# Patient Record
Sex: Male | Born: 1935 | Race: White | Hispanic: No
Health system: Midwestern US, Community
[De-identification: ages and names within clinical notes are randomized; demographics above are authoritative.]

## PROBLEM LIST (undated history)

## (undated) DIAGNOSIS — K089 Disorder of teeth and supporting structures, unspecified: Secondary | ICD-10-CM

## (undated) DIAGNOSIS — Z978 Presence of other specified devices: Secondary | ICD-10-CM

## (undated) DIAGNOSIS — I4891 Unspecified atrial fibrillation: Secondary | ICD-10-CM

## (undated) DIAGNOSIS — E785 Hyperlipidemia, unspecified: Secondary | ICD-10-CM

## (undated) DIAGNOSIS — Z923 Personal history of irradiation: Secondary | ICD-10-CM

## (undated) DIAGNOSIS — E0590 Thyrotoxicosis, unspecified without thyrotoxic crisis or storm: Secondary | ICD-10-CM

## (undated) DIAGNOSIS — H6692 Otitis media, unspecified, left ear: Secondary | ICD-10-CM

## (undated) DIAGNOSIS — I10 Essential (primary) hypertension: Secondary | ICD-10-CM

## (undated) DIAGNOSIS — I499 Cardiac arrhythmia, unspecified: Secondary | ICD-10-CM

## (undated) DIAGNOSIS — C679 Malignant neoplasm of bladder, unspecified: Secondary | ICD-10-CM

## (undated) DIAGNOSIS — N400 Enlarged prostate without lower urinary tract symptoms: Secondary | ICD-10-CM

## (undated) DIAGNOSIS — D6869 Other thrombophilia: Secondary | ICD-10-CM

## (undated) DIAGNOSIS — K219 Gastro-esophageal reflux disease without esophagitis: Secondary | ICD-10-CM

## (undated) DIAGNOSIS — H7292 Unspecified perforation of tympanic membrane, left ear: Secondary | ICD-10-CM

## (undated) DIAGNOSIS — R519 Headache, unspecified: Secondary | ICD-10-CM

## (undated) HISTORY — PX: MOUTH SURGERY: SHX715

## (undated) HISTORY — DX: Cardiac arrhythmia, unspecified: I49.9

## (undated) HISTORY — DX: Benign prostatic hyperplasia without lower urinary tract symptoms: N40.0

## (undated) HISTORY — DX: Malignant neoplasm of bladder, unspecified: C67.9

---

## 2006-07-04 DIAGNOSIS — D490 Neoplasm of unspecified behavior of digestive system: Secondary | ICD-10-CM

## 2006-07-04 HISTORY — DX: Neoplasm of unspecified behavior of digestive system: D49.0

## 2019-12-24 ENCOUNTER — Ambulatory Visit: Admit: 2019-12-24 | Discharge: 2019-12-24 | Payer: MEDICARE | Attending: Otolaryngology | Primary: Family Medicine

## 2019-12-24 ENCOUNTER — Ambulatory Visit: Attending: Otolaryngology | Primary: Family Medicine

## 2019-12-24 DIAGNOSIS — C050 Malignant neoplasm of hard palate: Secondary | ICD-10-CM

## 2019-12-24 NOTE — Progress Notes (Signed)
HPI:  Austin Wood is a 86 y.o. male seen as a new patient for New Patient (Patient presents today with c/o excessive mucus and sensation of burning of his lips. Patient has history of cancer of hard palate 2007, he had surgery and radiation. Patient also has concerns about L ear , he'd like to be checked for infection . He has pain that comes and goes. ).     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  Patient has been doing very well in regards to his history of cancer with no suspicion of recurrence over the years and has been using obturator for the last 14 years with good benefit.  He also has a history of chronic hearing loss left greater than right and uses a right-sided hearing aid with good benefit.  He has a history of multiple ear infections particular on the left side with a history of left-sided TM perforation.  There is also been some associated increased morning time postnasal drip and throat drainage that has been annoying him and is suspicious to be related to his history of radiation therapy.    Past Medical History, Past Surgical History, Family history, Social History, and Medications were all reviewed with the patient today and updated as necessary.     Allergies   Allergen Reactions   ??? Aspirin Swelling   ??? Sulfa (Sulfonamide Antibiotics) Rash       There is no problem list on file for this patient.      Current Outpatient Medications   Medication Sig   ??? atorvastatin (LIPITOR) 10 mg tablet TAKE 1 TABLET BY MOUTH EVERY DAY   ??? mirtazapine (REMERON) 15 mg tablet    ??? magnesium oxide (MAG-OX) 400 mg tablet TAKE 1 TABLET BY MOUTH EVERY DAY   ??? metoprolol succinate (TOPROL-XL) 50 mg XL tablet TAKE 1 TABLET BY MOUTH EVERY DAY   ??? potassium chloride SR (KLOR-CON 10) 10 mEq tablet    ??? tamsulosin (FLOMAX) 0.4 mg capsule TAKE 1 CAPSULE BY MOUTH EVERY DAY   ??? levothyroxine (SYNTHROID)  50 mcg tablet TAKE 1 TABLET (50 MCG) BY MOUTH DAILY   ??? liothyronine (CYTOMEL) 5 mcg tablet    ??? pantoprazole (PROTONIX) 40 mg tablet    ??? warfarin (COUMADIN) 3 mg tablet    ??? warfarin (COUMADIN) 2 mg tablet    ??? furosemide (LASIX) 20 mg tablet TAKE 1 TABLET BY MOUTH EVERY DAY     No current facility-administered medications for this visit.       Past Medical History:   Diagnosis Date   ??? Carcinoma in situ of hard palate        History reviewed. No pertinent surgical history.    Social History     Tobacco Use   ??? Smoking status: Never Smoker   ??? Smokeless tobacco: Never Used   Substance Use Topics   ??? Alcohol use: Not Currently       History reviewed. No pertinent family history.     ROS:    Review of Systems   Constitutional: Negative for chills and fever.   HENT: Positive for ear pain. Negative for hearing loss.    Eyes: Negative for blurred vision.   Respiratory: Negative for cough.    Cardiovascular: Negative for chest pain.   Gastrointestinal: Negative for heartburn.   Genitourinary: Negative for dysuria.   Musculoskeletal: Negative for  myalgias.   Skin: Negative for rash.   Neurological: Negative for dizziness.   Psychiatric/Behavioral: Negative for depression.          PHYSICAL EXAM:    Visit Vitals  Resp 16   Ht 5\' 11"  (1.803 m)   Wt 160 lb (72.6 kg)   BMI 22.32 kg/m??       Physical Exam  Vitals and nursing note reviewed.   Constitutional:       General: He is awake. He is not in acute distress.     Appearance: Normal appearance. He is well-developed and normal weight. He is not ill-appearing or diaphoretic.   HENT:      Head: Normocephalic and atraumatic.      Jaw: No trismus, tenderness, swelling or pain on movement.      Salivary Glands: Right salivary gland is not diffusely enlarged or tender. Left salivary gland is not diffusely enlarged or tender.      Right Ear: Tympanic membrane, ear canal and external ear normal. No drainage, swelling or tenderness. No middle ear effusion. There is no impacted  cerumen. Tympanic membrane is not perforated.      Left Ear: Tympanic membrane, ear canal and external ear normal. No drainage, swelling or tenderness.  No middle ear effusion. There is no impacted cerumen. Tympanic membrane is not perforated.      Ears:      Comments: Bilateral cerumen impactions debrided under microscopy.    Binocular microscopy exam revealed:    Bilateral EACs patent with no edema or erythema.  Right-sided TMs intact with no perforations, retractions or middle ear effusions.  Left-sided TM with moderate sized anterior perforation without evidence of drainage, middle ear effusion, inflammation.  Light coating of CSF powder applied     Nose: Nose normal. No nasal deformity, nasal tenderness, mucosal edema, congestion or rhinorrhea.      Right Nostril: No epistaxis, septal hematoma or occlusion.      Left Nostril: No epistaxis, septal hematoma or occlusion.      Mouth/Throat:      Lips: No lesions.      Mouth: Mucous membranes are moist. No injury or oral lesions.      Tongue: No lesions. Tongue does not deviate from midline.      Palate: No mass and lesions.      Pharynx: Oropharynx is clear. Uvula midline. No pharyngeal swelling, oropharyngeal exudate, posterior oropharyngeal erythema or uvula swelling.      Tonsils: No tonsillar exudate or tonsillar abscesses.      Comments: Obturator removed.  Left sided palatal defect noted with good mature old borders without evidence of granulation tissue and minimal midline location of leukoplakia.  This appears to be at the area of the location of where the operator comes in the contact at the midline.  No concern for mucosal or submucosal recurrence of lesions masses or tumors.  Remaining oral cavity and oral pharyngeal exam within normal limits  Eyes:      General: No scleral icterus.     Extraocular Movements: Extraocular movements intact.      Conjunctiva/sclera: Conjunctivae normal.      Pupils: Pupils are equal, round, and reactive to light.   Neck:       Thyroid: No thyroid mass or thyromegaly.      Trachea: Trachea and phonation normal. No tracheal tenderness.   Pulmonary:      Effort: Pulmonary effort is normal. No tachypnea.      Breath sounds: No  stridor.   Musculoskeletal:      Cervical back: Normal range of motion and neck supple. No edema, erythema or rigidity.   Lymphadenopathy:      Head:      Right side of head: No submental, submandibular, preauricular or posterior auricular adenopathy.      Left side of head: No submental, submandibular, preauricular or posterior auricular adenopathy.      Cervical: No cervical adenopathy.      Right cervical: No superficial, deep or posterior cervical adenopathy.     Left cervical: No superficial, deep or posterior cervical adenopathy.   Skin:     General: Skin is warm and dry.   Neurological:      Mental Status: He is alert and oriented to person, place, and time.      Cranial Nerves: Cranial nerves are intact. No facial asymmetry.   Psychiatric:         Mood and Affect: Mood and affect normal.         Behavior: Behavior normal.            ASSESSMENT and PLAN        ICD-10-CM ICD-9-CM    1. Squamous cell carcinoma of hard palate (HCC)  C05.0 145.2    2. History of head and neck cancer  Z85.89 V10.89    3. Bilateral impacted cerumen  H61.23 380.4    4. Postnasal drip  R09.82 784.91      Patient was reassured of no concerning findings on the examination of the ears following cerumen debridement.  He does have chronic appearing left anterior moderate sized TM perforation but with minimal hearing on that side.  There is no evidence of chronic or acute inflammatory changes.  Light coating of CSF powder was applied for scant moisture.    Obturator removed from palate which revealed very chronic well-healed defect to the left side palate.  There is no evidence of recurrence of malignant type lesions.    Regarding chronic postnasal drip this is likely related to his palatal defect and history of radiation.  Recommend  humidifier at night and increased oral hydration.  Can also attempt nasal saline sprays as needed.    Recommend follow-up in 12 months for recheck.    Lonzo Cloud, DO  12/24/2019

## 2020-01-16 ENCOUNTER — Ambulatory Visit: Admit: 2020-01-16 | Discharge: 2020-01-16 | Attending: Otolaryngology | Primary: Family Medicine

## 2020-01-16 ENCOUNTER — Ambulatory Visit: Attending: Otolaryngology | Primary: Family Medicine

## 2020-01-16 DIAGNOSIS — K047 Periapical abscess without sinus: Secondary | ICD-10-CM

## 2020-01-16 MED ORDER — CLINDAMYCIN 300 MG CAP
300 mg | ORAL_CAPSULE | Freq: Three times a day (TID) | ORAL | 0 refills | Status: AC
Start: 2020-01-16 — End: 2020-01-23

## 2020-01-16 NOTE — Progress Notes (Signed)
HPI:  Austin Wood is a 86 y.o. male seen for follow up on Ear Pain (Patient presents today with c/o L ear pain x 1 week . Patient states that his pain now radiates to L eye, jaw , and L nostril . Patient complains that his L nostril is congested and usually it will stay that way until the afternoon . ).     Patient presents for follow-up evaluation with new complaint of left-sided otalgia.  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  Patient has been doing very well in regards to his history of cancer with no suspicion of recurrence over the years and has been using obturator for the last 14 years with good benefit.  He also has a history of chronic hearing loss left greater than right and uses a right-sided hearing aid with good benefit.  He has a history of multiple ear infections particular on the left side with a history of left-sided TM perforation.  He states that over the last week he has had significant left-sided ear pain with radiation to his left jaw.  There has been no otorrhea.  There is been no change into his hearing.  He also has multiple fractured teeth which have been watched for the last few years.  He does have dental pain to the left jaw near one of his fracture locations.  He also complains of increased left-sided nasal drainage for the last 1 to 2 weeks.  He does have drainage long-term after his surgery and radiation.    Past Medical History, Past Surgical History, Family history, Social History, and Medications were all reviewed with the patient today and updated as necessary.     Allergies   Allergen Reactions   ??? Aspirin Swelling   ??? Sulfa (Sulfonamide Antibiotics) Rash       There is no problem list on file for this patient.      Current Outpatient Medications   Medication Sig   ??? allopurinoL (ZYLOPRIM) 100 mg tablet TAKE 2 TABLETS BY MOUTH EVERY DAY   ??? clindamycin (CLEOCIN) 300 mg capsule Take 1  Capsule by mouth three (3) times daily for 7 days.   ??? atorvastatin (LIPITOR) 10 mg tablet TAKE 1 TABLET BY MOUTH EVERY DAY   ??? mirtazapine (REMERON) 15 mg tablet    ??? magnesium oxide (MAG-OX) 400 mg tablet TAKE 1 TABLET BY MOUTH EVERY DAY   ??? metoprolol succinate (TOPROL-XL) 50 mg XL tablet TAKE 1 TABLET BY MOUTH EVERY DAY   ??? potassium chloride SR (KLOR-CON 10) 10 mEq tablet    ??? tamsulosin (FLOMAX) 0.4 mg capsule TAKE 1 CAPSULE BY MOUTH EVERY DAY   ??? levothyroxine (SYNTHROID) 50 mcg tablet TAKE 1 TABLET (50 MCG) BY MOUTH DAILY   ??? liothyronine (CYTOMEL) 5 mcg tablet    ??? pantoprazole (PROTONIX) 40 mg tablet    ??? warfarin (COUMADIN) 3 mg tablet    ??? warfarin (COUMADIN) 2 mg tablet    ??? furosemide (LASIX) 20 mg tablet TAKE 1 TABLET BY MOUTH EVERY DAY     No current facility-administered medications for this visit.       Past Medical History:   Diagnosis Date   ??? Carcinoma in situ of hard palate        History reviewed. No pertinent surgical history.    Social History     Tobacco Use   ??? Smoking status:  Never Smoker   ??? Smokeless tobacco: Never Used   Substance Use Topics   ??? Alcohol use: Not Currently       History reviewed. No pertinent family history.     ROS:    Review of Systems   Constitutional: Negative for chills and fever.   HENT: Positive for ear pain. Negative for hearing loss.    Eyes: Negative for blurred vision.   Respiratory: Negative for cough.    Cardiovascular: Negative for chest pain.   Gastrointestinal: Negative for heartburn.   Genitourinary: Negative for dysuria.   Musculoskeletal: Negative for myalgias.   Skin: Negative for rash.   Neurological: Negative for dizziness.   Psychiatric/Behavioral: Negative for depression.          PHYSICAL EXAM:    Visit Vitals  Resp 17   Ht 5\' 11"  (1.803 m)   Wt 159 lb (72.1 kg)   BMI 22.18 kg/m??       Physical Exam  Vitals and nursing note reviewed.   Constitutional:       General: He is awake. He is not in acute distress.     Appearance: Normal appearance. He  is well-developed. He is not ill-appearing or diaphoretic.   HENT:      Head: Normocephalic and atraumatic.      Jaw: No trismus, tenderness, swelling or pain on movement.      Salivary Glands: Right salivary gland is not diffusely enlarged or tender. Left salivary gland is not diffusely enlarged or tender.      Right Ear: Tympanic membrane, ear canal and external ear normal. No drainage, swelling or tenderness. No middle ear effusion. There is no impacted cerumen. Tympanic membrane is not perforated.      Left Ear: Tympanic membrane, ear canal and external ear normal. No drainage, swelling or tenderness.  No middle ear effusion. There is no impacted cerumen. Tympanic membrane is not perforated.      Ears:      Comments: Binocular microscopy exam was performed.    He has stable examination of his ears including a chronic left-sided TM perforation.  There is no otorrhea and no fluid to the middle ear space.  No signs of inflammation or infection.    Left EAC clear with normal-appearing TM without retractions perforations or middle ear effusion.     Nose: Nose normal. No nasal deformity, nasal tenderness, mucosal edema, congestion or rhinorrhea.      Right Nostril: No epistaxis, septal hematoma or occlusion.      Left Nostril: No epistaxis, septal hematoma or occlusion.      Mouth/Throat:      Lips: No lesions.      Mouth: Mucous membranes are moist. No injury or oral lesions.      Tongue: No lesions. Tongue does not deviate from midline.      Palate: No mass and lesions.      Pharynx: Oropharynx is clear. Uvula midline. No pharyngeal swelling, oropharyngeal exudate, posterior oropharyngeal erythema or uvula swelling.      Tonsils: No tonsillar exudate or tonsillar abscesses.      Comments: Obturator removed from heart which reveals large left-sided defect.  There is some thickened nondiscolored drainage from the palate defect.  This was easily cleared with suction.  No signs of inflammatory changes such as edema or  erythema.  Eyes:      General: No scleral icterus.     Extraocular Movements: Extraocular movements intact.      Conjunctiva/sclera: Conjunctivae  normal.      Pupils: Pupils are equal, round, and reactive to light.   Neck:      Thyroid: No thyroid mass or thyromegaly.      Trachea: Trachea and phonation normal. No tracheal tenderness.   Pulmonary:      Effort: Pulmonary effort is normal. No tachypnea.      Breath sounds: No stridor.   Musculoskeletal:      Cervical back: Normal range of motion and neck supple. No edema, erythema or rigidity.   Lymphadenopathy:      Head:      Right side of head: No submental, submandibular, preauricular or posterior auricular adenopathy.      Left side of head: No submental, submandibular, preauricular or posterior auricular adenopathy.      Cervical: No cervical adenopathy.      Right cervical: No superficial, deep or posterior cervical adenopathy.     Left cervical: No superficial, deep or posterior cervical adenopathy.   Skin:     General: Skin is warm and dry.   Neurological:      Mental Status: He is alert and oriented to person, place, and time.      Cranial Nerves: Cranial nerves are intact. No facial asymmetry.   Psychiatric:         Mood and Affect: Mood and affect normal.         Behavior: Behavior normal.            ASSESSMENT and PLAN        ICD-10-CM ICD-9-CM    1. Dental infection  K04.7 522.4    2. Poor dentition  K08.9 525.9 REFERRAL TO ORAL MAXILLOFACIAL SURGERY   3. Otalgia of left ear  H92.02 388.70 EAR MICROSCOPY EXAMINATION   4. 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    He  does have some tenderness over a left-sided mandibular fracture tooth which I have recommended clindamycin and referral to oral surgery for further management recommendations.  He may benefit from having dental extractions.    has otalgia which appears secondary to TMJ dysfunction. Both of his ears looked completely healthy on my exam today w/ no other pathology. For now, I recommend conservative measures w/ soft diet, no gum chewing, warm compresses and OTC anti-inflammatories. If conservatives measures fail then will offer PT referral for TMJ therapy.    For his chronic nasal drainage is likely related to his prior history of radiation therapy and his palate cancer surgery.  He can utilize saline misting sprays as needed for symptomatic relief.      Lonzo Cloud, DO  01/16/2020

## 2020-01-29 NOTE — Telephone Encounter (Signed)
Patient's son called to follow up on his Oral Surgery Referral. Office information from referral given to him.

## 2020-04-23 ENCOUNTER — Ambulatory Visit: Admit: 2020-04-23 | Discharge: 2020-04-23 | Payer: MEDICARE | Attending: Medical | Primary: Family Medicine

## 2020-04-23 ENCOUNTER — Ambulatory Visit: Attending: Medical | Primary: Family Medicine

## 2020-04-23 DIAGNOSIS — H9212 Otorrhea, left ear: Secondary | ICD-10-CM

## 2020-04-23 MED ORDER — OFLOXACIN 0.3 % EAR DROPS
0.3 % | Freq: Two times a day (BID) | OTIC | 2 refills | Status: AC
Start: 2020-04-23 — End: 2020-04-30

## 2020-04-23 NOTE — Progress Notes (Signed)
HPI:  Austin Wood is a 86 y.o. male seen for follow up on Ear Pain (Patient is having pain in left ear and drainage that started about ten days ago.).   Patient presents to the clinic for evaluation of left ear drainage. He reports that the ear started draining 0 days ago. He reports that he has a chronic problem with drainage from the ear. Patient has a history of squamous cell carcinoma of the hard palate in 2007. He has an obturator for the hard palate. Patient denies otalgia.     Past Medical History, Past Surgical History, Family history, Social History, and Medications were all reviewed with the patient today and updated as necessary.     Allergies   Allergen Reactions   ??? Aspirin Swelling   ??? Clindamycin Hives   ??? Sulfa (Sulfonamide Antibiotics) Rash     There is no problem list on file for this patient.    Current Outpatient Medications   Medication Sig   ??? ofloxacin (FLOXIN) 0.3 % otic solution Administer 5 Drops in left ear two (2) times a day for 7 days.   ??? allopurinoL (ZYLOPRIM) 100 mg tablet TAKE 2 TABLETS BY MOUTH EVERY DAY   ??? atorvastatin (LIPITOR) 10 mg tablet TAKE 1 TABLET BY MOUTH EVERY DAY   ??? mirtazapine (REMERON) 15 mg tablet    ??? magnesium oxide (MAG-OX) 400 mg tablet TAKE 1 TABLET BY MOUTH EVERY DAY   ??? metoprolol succinate (TOPROL-XL) 50 mg XL tablet TAKE 1 TABLET BY MOUTH EVERY DAY   ??? potassium chloride SR (KLOR-CON 10) 10 mEq tablet    ??? tamsulosin (FLOMAX) 0.4 mg capsule TAKE 1 CAPSULE BY MOUTH EVERY DAY   ??? levothyroxine (SYNTHROID) 50 mcg tablet TAKE 1 TABLET (50 MCG) BY MOUTH DAILY   ??? liothyronine (CYTOMEL) 5 mcg tablet    ??? pantoprazole (PROTONIX) 40 mg tablet    ??? warfarin (COUMADIN) 3 mg tablet    ??? warfarin (COUMADIN) 2 mg tablet    ??? furosemide (LASIX) 20 mg tablet TAKE 1 TABLET BY MOUTH EVERY DAY     No current facility-administered medications for this visit.     Past Medical History:   Diagnosis Date   ??? Carcinoma in situ of hard palate      Social History     Tobacco Use    ??? Smoking status: Never Smoker   ??? Smokeless tobacco: Never Used   Substance Use Topics   ??? Alcohol use: Not Currently     No past surgical history on file.  No family history on file.     ROS:    Review of Systems   Constitutional: Negative for chills and fever.   HENT: Positive for ear discharge and ear pain.    Eyes: Negative for blurred vision and double vision.   Respiratory: Negative for cough.    Cardiovascular: Negative for chest pain.   Gastrointestinal: Negative for nausea and vomiting.   Musculoskeletal: Negative for neck pain.   Skin: Negative for rash.   Neurological: Negative for dizziness and headaches.   Endo/Heme/Allergies: Negative for environmental allergies.        PHYSICAL EXAM:    Visit Vitals  Resp 16   Ht 5\' 11"  (1.803 m)   Wt 162 lb (73.5 kg)   BMI 22.59 kg/m??       Head  Head and Face - The head and face are atraumatic, normocephalic.  The salivary glands are intact and the facial appearance  is symmetric.    Head shape - No scars, lesions, or masses    Ear  Ear - Right tympanic membrane is clear, the external auditory canal is without discharge and the tympanic membrane is mobile.  There is no tympanic membrane erythema and no middle ear opacity is visualized.  Left chronic TM perforation which is moist.   Pinna: bilateral - No hematomas or lacerations    Eye  Eyeball - bilateral - extraocular motions intact, equal in size and movement    Nose and Sinuses  Nose - mucosa is pink and the septum is midline.  There are no nasal lesions and there was no turbinate hypertrophy.    Mouth and Throat  Lips - upper lip - normal: no dryness, cracking, pallor, cyanosis, or vesicular eruption.  Lower lip: normal: no dryness, cracking, pallor, cyanosis, or vesicular eruption.     Teeth and Gums - No bleeding, no inflammation or ulceration.    Lips - Pink and symmetrical  Oral Cavity - Oral mucosa pink, hard palate obturator in place.  The mucosa is without ulcerations. No oral cavity masses present.    Parotid Gland - Bilateral - Non tender, not swollen.  Oropharynx - No discharge or Erythema  Nasopharynx - Non obstructed, mucosa pink and moist.    Hypopharynx - No erythema  Submandibular Gland - Non tender, not swollen.    Tonsils - Normal    Neck   Neck - Full range of motion and Supple.  Non Tender.   No Masses.    Trachea - Midline.  Thyroid - Gland - Symmetric.  Non Tender.  Nodules - No nodules.    Neurologic - II - XII Grossly intact bilaterally    Cardiac  Inspection - Jugular Vein:  Bilateral - non distended, no prominent pulsations    Chest and Lung  Inspection - Movements:  Chest symmetrical with bilateral expansion, respirations even and non labored      ASSESSMENT and PLAN      ICD-10-CM ICD-9-CM    1. Otorrhea of left ear  H92.12 388.60    2. Perforation of left tympanic membrane  H72.92 384.20    3. History of head and neck cancer  Z85.89 V10.89        Given patient's history, the obturator was removed and he was examined by attending.  According to attending (Dr Argentina Ponder) there were no concerns for cancer of the hard palate. Patient is scheduled to see an oral surgeon soon. I will send in floxin drops for the left ear.      Neill Loft, PA-C  04/23/2020

## 2020-06-12 ENCOUNTER — Ambulatory Visit
Admit: 2020-06-12 | Discharge: 2020-06-12 | Payer: MEDICARE | Attending: Otolaryngology/Facial Plastic Surgery | Primary: Family Medicine

## 2020-06-12 ENCOUNTER — Encounter: Attending: Medical | Primary: Family Medicine

## 2020-06-12 ENCOUNTER — Ambulatory Visit: Attending: Otolaryngology/Facial Plastic Surgery | Primary: Family Medicine

## 2020-06-12 DIAGNOSIS — H7292 Unspecified perforation of tympanic membrane, left ear: Secondary | ICD-10-CM

## 2020-06-12 NOTE — Progress Notes (Signed)
HPI:  Austin Wood is a 86 y.o. male seen in follow-up for Ear Pain (Patient presents today with c/o bilateral ear pain L>R .). Comes in today w/ L > R otalgia- he has known L TM perforation and has seen Dr. Elby Beck and Andee Poles in the past. He feels like there is some drainage in L ear but he has not seen any crusting around the meatus. There is some pain in L ear intermittently as well. He has some pain on R side today as well but no otorrhea. 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 He does report significant nasal crusting on L side almost every morning and increased tearing in L eye as well. He uses warm compresses over the L side of his face to help relieve the pressure.    Past Medical History, Past Surgical History, Family history, Social History, and Medications were all reviewed with the patient today and updated as necessary.     Allergies   Allergen Reactions   ??? Clindamycin Hives     Other reaction(s): Myalgia-Intolerance  stomach pain    ??? Aspirin Swelling   ??? Sulfa (Sulfonamide Antibiotics) Rash     There is no problem list on file for this patient.    Current Outpatient Medications   Medication Sig   ??? allopurinoL (ZYLOPRIM) 100 mg tablet TAKE 2 TABLETS BY MOUTH EVERY DAY   ??? atorvastatin (LIPITOR) 10 mg tablet TAKE 1 TABLET BY MOUTH EVERY DAY   ??? mirtazapine (REMERON) 15 mg tablet    ??? magnesium oxide (MAG-OX) 400 mg tablet TAKE 1 TABLET BY MOUTH EVERY DAY   ??? metoprolol succinate (TOPROL-XL) 50 mg XL tablet TAKE 1 TABLET BY MOUTH EVERY DAY   ??? potassium chloride SR (KLOR-CON 10) 10 mEq tablet    ??? tamsulosin (FLOMAX) 0.4 mg capsule TAKE 1 CAPSULE BY MOUTH EVERY DAY   ??? levothyroxine (SYNTHROID) 50 mcg tablet TAKE 1 TABLET (50 MCG) BY MOUTH DAILY   ??? liothyronine (CYTOMEL) 5 mcg tablet    ??? pantoprazole (PROTONIX) 40 mg tablet    ??? warfarin (COUMADIN) 3 mg tablet    ??? warfarin  (COUMADIN) 2 mg tablet    ??? furosemide (LASIX) 20 mg tablet TAKE 1 TABLET BY MOUTH EVERY DAY     No current facility-administered medications for this visit.     Past Medical History:   Diagnosis Date   ??? Carcinoma in situ of hard palate      Social History     Tobacco Use   ??? Smoking status: Never Smoker   ??? Smokeless tobacco: Never Used   Substance Use Topics   ??? Alcohol use: Not Currently     History reviewed. No pertinent surgical history.  History reviewed. No pertinent family history.     ROS:    Review of Systems   Constitutional: Negative for activity change.   HENT: Positive for ear pain.    Eyes: Negative for discharge.   Respiratory: Negative for apnea.    Cardiovascular: Negative for chest pain.   Gastrointestinal: Negative for abdominal distention.   Endocrine: Negative for cold intolerance.   Genitourinary: Negative for difficulty urinating.   Musculoskeletal: Negative for arthralgias.   Skin: Negative for color change.   Allergic/Immunologic: Negative for environmental allergies.   Neurological: Negative for dizziness.   Hematological: Negative  for adenopathy.   Psychiatric/Behavioral: Negative for agitation.        PHYSICAL EXAM:    Visit Vitals  Resp 19   Ht 5\' 11"  (1.803 m)   Wt 164 lb (74.4 kg)   BMI 22.87 kg/m??       General: NAD, well-appearing  Neuro: No gross neuro deficits. No facial weakness.  Eyes: No periorbital edema/ecchymosis. No nystagmus.  Skin: No facial erythema, rashes or concerning lesions.  Nose: No external deviations or saddling. Intranasally, septum is midline without perforations, nasal mucosa appears healthy with no erythema, mucopurulence, or polyps.  Mouth: Otburator in place. Poor remaining dentition. Changes c/w RT along pharynx w/ telangiectasias- no concerning masses or lesions.   Ears: Normal appearing auricles, no hematomas. R side- mild cerumen (removed), dry canal skin, intact TM, clear ME space. L side- clear laterally but there is crust overlying a 25-30% central  TM perf w/ some surrounding granulation- ME space was clear.   Neck: Soft, supple, no palpable neck masses. No palpable parotid or submandibular masses. No thyromegaly or palpable thyroid nodules.   Lymphatics: No palpable cervical LAD.  Resp: No audible stridor or wheezing.  Extremities: No clubbing or cyanosis.      ASSESSMENT and PLAN      ICD-10-CM ICD-9-CM    1. Tympanic membrane perforation, left  H72.92 384.20    2. Otorrhea of left ear  H92.12 388.60      He had some moisture down near his known L TM perforation and there was even some associated granulation. I debrided that ear and applied some CSF powder. He will continue to follow strict dry ear precautions on that side. My Audiologist cleaned out his R sided hearing aids as well. If the drainage continues, he may need some ear drops. RTC prn.    Marijo File, MD  06/12/2020

## 2020-09-18 MED ORDER — CIPROFLOXACIN-DEXAMETHASONE 0.3 %-0.1 % EAR DROPS, SUSP
Freq: Two times a day (BID) | OTIC | 2 refills | Status: AC
Start: 2020-09-18 — End: ?

## 2020-09-21 MED ORDER — NEOMYCIN-POLYMYXIN-HC 3.5 MG-10,000 UNIT/ML-1 % EAR DROPS, SUSP
3.5-10000-1 mg/mL-unit/mL-% | Freq: Three times a day (TID) | OTIC | 2 refills | Status: AC
Start: 2020-09-21 — End: 2020-09-26

## 2020-11-08 ENCOUNTER — Inpatient Hospital Stay
Admit: 2020-11-08 | Discharge: 2020-11-08 | Disposition: A | Discharge status: Home / Self Care | Payer: MEDICARE | Attending: Student in an Organized Health Care Education/Training Program

## 2020-11-08 DIAGNOSIS — N390 Urinary tract infection, site not specified: Secondary | ICD-10-CM

## 2020-11-08 LAB — URINALYSIS W/ RFLX MICROSCOPIC
Bilirubin, Urine: NEGATIVE
Bilirubin: NEGATIVE
Casts UA: 0 /lpf
Casts: 0 /lpf
Glucose, Ur: NEGATIVE mg/dL
Glucose: NEGATIVE mg/dL
Ketone: NEGATIVE mg/dL
Ketones, Urine: NEGATIVE mg/dL
Nitrite, Urine: POSITIVE — AB
Nitrites: POSITIVE — AB
RBC, UA: >100 /hpf — ABNORMAL HIGH
RBC: >100 /hpf — ABNORMAL HIGH
Specific Gravity, UA: 1.008 (ref 1.001–1.023)
Specific gravity: 1.008 (ref 1.001–1.023)
Urobilinogen, UA, POCT: 0.2 EU/dL (ref 0.2–1.0)
Urobilinogen: 0.2 EU/dL (ref 0.2–1.0)
WBC, UA: >100 /hpf — ABNORMAL HIGH
WBC: >100 /hpf — ABNORMAL HIGH
pH (UA): 7 (ref 5.0–9.0)
pH, UA: 7 (ref 5.0–9.0)

## 2020-11-08 MED ORDER — LIDOCAINE 2 % MUCOUS MEMBRANE JELLY IN APPLICATOR
2 % | Freq: Once | Status: DC
Start: 2020-11-08 — End: 2020-11-08

## 2020-11-08 MED ORDER — CEFPODOXIME 100 MG TAB
100 mg | ORAL_TABLET | Freq: Two times a day (BID) | ORAL | 0 refills | Status: AC
Start: 2020-11-08 — End: 2020-11-18

## 2020-11-08 NOTE — ED Notes (Signed)
I have reviewed discharge instructions with the patient and caregiver.  The patient and caregiver verbalized understanding.    Patient left ED via Discharge Method: ambulatory to Home with son.    Opportunity for questions and clarification provided.       Patient given 1 scripts.         To continue your aftercare when you leave the hospital, you may receive an automated call from our care team to check in on how you are doing.  This is a free service and part of our promise to provide the best care and service to meet your aftercare needs." If you have questions, or wish to unsubscribe from this service please call [phone].  Thank you for Choosing our [facility] Emergency Department.

## 2020-11-08 NOTE — ED Provider Notes (Signed)
ED Provider Notes by Debera Lat, DO at 11/08/20 6063                Author: Debera Lat, DO  Service: Emergency Medicine  Author Type: Physician       Filed: 11/08/20 0248  Date of Service: 11/08/20 0226  Status: Addendum          Editor: Debera Lat, DO (Physician)          Related Notes: Original Note by Debera Lat, DO (Physician) filed at 11/08/20 0160            Procedure Orders        1. Bedside US [[id number]] ordered by Debera Lat, DO                              87 year old male patient presents to this department with reports of urinary retention.  Patient states has been  unable to urinate normally for at least 10 hours.  Son at bedside with whom patient resides states that he passes only small drops of fluid.  He was recently treated for UTI and completed the entire course of the medication.  Patient reports discomfort  at the suprapubic region and the urge to urinate.  No reports of fever or chills, nausea or vomiting.  Patient has experienced similar episode in the past, he is established with a local urologist but is yet to see this provider as he just recently relocated  from [place] to live with his son.                  Past Medical History:        Diagnosis  Date         ?  Carcinoma in situ of hard palate             No past surgical history on file.        No family history on file.        Social History          Socioeconomic History         ?  Marital status:  WIDOWED              Spouse name:  Not on file         ?  Number of children:  Not on file     ?  Years of education:  Not on file     ?  Highest education level:  Not on file       Occupational History        ?  Not on file       Tobacco Use         ?  Smoking status:  Never Smoker     ?  Smokeless tobacco:  Never Used       Vaping Use         ?  Vaping Use:  Never used       Substance and Sexual Activity         ?  Alcohol use:  Not Currently     ?  Drug use:  Not on file     ?   Sexual activity:  Not on file        Other Topics  Concern        ?  Not on file  Social History Narrative        ?  Not on file          Social Determinants of Health          Financial Resource Strain:         ?  Difficulty of Paying Living Expenses: Not on file       Food Insecurity:         ?  Worried About Running Out of Food in the Last Year: Not on file     ?  Ran Out of Food in the Last Year: Not on file       Transportation Needs:         ?  Lack of Transportation (Medical): Not on file     ?  Lack of Transportation (Non-Medical): Not on file       Physical Activity:         ?  Days of Exercise per Week: Not on file     ?  Minutes of Exercise per Session: Not on file       Stress:         ?  Feeling of Stress : Not on file       Social Connections:         ?  Frequency of Communication with Friends and Family: Not on file     ?  Frequency of Social Gatherings with Friends and Family: Not on file     ?  Attends Religious Services: Not on file     ?  Active Member of Clubs or Organizations: Not on file     ?  Attends Banker Meetings: Not on file     ?  Marital Status: Not on file       Intimate Partner Violence:         ?  Fear of Current or Ex-Partner: Not on file     ?  Emotionally Abused: Not on file     ?  Physically Abused: Not on file     ?  Sexually Abused: Not on file       Housing Stability:         ?  Unable to Pay for Housing in the Last Year: Not on file     ?  Number of Places Lived in the Last Year: Not on file        ?  Unstable Housing in the Last Year: Not on file              ALLERGIES: Clindamycin, Aspirin, and Sulfa (sulfonamide antibiotics)      Review of Systems    Constitutional: Negative for chills, diaphoresis and fever.    HENT: Negative for congestion, sneezing and sore throat.     Eyes: Negative for visual disturbance.    Respiratory: Negative for cough, chest tightness, shortness of breath and wheezing.     Cardiovascular: Negative for chest pain and leg  swelling.    Gastrointestinal: Positive for abdominal pain. Negative for blood in stool, diarrhea, nausea and vomiting.    Endocrine: Negative for polyuria.    Genitourinary: Positive for difficulty urinating and urgency . Negative for dysuria, flank pain and hematuria.    Musculoskeletal: Negative for back pain, myalgias, neck pain and neck stiffness.    Skin: Negative for color change and rash.    Neurological: Negative for dizziness, syncope, speech difficulty, weakness, light-headedness, numbness and headaches.    Psychiatric/Behavioral:  Negative for behavioral problems.    All other systems reviewed and are negative.           Vitals:          11/08/20 0137        BP:  (!) 146/71     Pulse:  92     Resp:  18     Temp:  97.4 ??F (36.3 ??C)     SpO2:  99%     Weight:  73.5 kg (162 lb)        Height:  5\' 11"  (1.803 m)                Physical Exam   Vitals and nursing note reviewed.   Constitutional:        General: He is not in acute distress.     Appearance: He is well-developed. He is not diaphoretic.      Comments: Well-appearing no elderly male patient,alert and oriented to person  place and time.  No acute distress, speaks in clear, fluid sentences.    HENT:       Head: Normocephalic and atraumatic.      Right Ear: External ear normal.      Left Ear: External ear normal.      Nose: Nose normal.   Eyes:       Pupils: Pupils are equal, round, and reactive to light.   Cardiovascular:       Rate and Rhythm: Normal rate and regular rhythm.      Heart sounds: Normal heart sounds. No murmur heard.   No friction rub. No gallop.     Pulmonary:       Effort: Pulmonary effort is normal. No respiratory distress.      Breath sounds: Normal breath sounds. No stridor. No decreased breath  sounds, wheezing, rhonchi or rales.   Chest :       Chest wall: No tenderness.   Abdominal :      General: There is no distension.      Palpations: Abdomen is soft. There is no mass.      Tenderness: There is abdominal tenderness  in the  suprapubic area. There is no guarding or rebound.      Hernia: No hernia is present.            Comments: Reproducible tenderness and fullness over the suprapubic region.      Musculoskeletal:          General: No tenderness or deformity. Normal range of motion.      Cervical back: Normal range of motion.    Skin:      General: Skin is warm and dry.   Neurological :       Mental Status: He is alert and oriented to person, place, and time.      Cranial Nerves: No cranial nerve deficit.             MDM   Number of Diagnoses or Management Options   Urinary retention: new  and requires workup   Urinary tract infection with hematuria, site unspecified: new and requires workup   Diagnosis management comments: Significant distention of the urinary bladder on bedside ultrasound imaging.  Place order for Foley catheterization and urinalysis.   Vitally stable otherwise.  No indication for laboratory testing at this time.   Patient's son, he is established with a local urologist with whom he is planning to follow-up on Friday of this week.  Will  encourage this follow-up and provide referral to [facility] urology as well.   Catheter in place, patient feels much better, urinalysis shows heavy glucose urea, hematuria and trace bacteria, will cover with antibiotic.  Culture sent on urinalysis as well.      Voice dictation software was used during the making of this note.  This software is not perfect and grammatical and other typographical errors may be present.  This note has been proofread, but may still contain errors.   Alfonso Patten Garvey Westcott, DO; 11/08/2020 @2 :28 AM    ===================================================================             Amount and/or Complexity of Data Reviewed   Clinical lab tests: ordered and reviewed      Risk of Complications, Morbidity, and/or Mortality   Presenting problems: moderate  Diagnostic procedures: low  Management options: moderate     Patient Progress   Patient progress:  stable             Bedside US      Date/Time: 11/08/2020 2:27 AM   Performed by:  Debera Lat, DO   Authorized by:  Debera Lat, DO       Verbal consent obtained: Yes     Given by:  Patient   Type of procedure:  Focused renal/urinary tract   Indications:  Abdominal pain and urinary retention   Right kidney long axis (coronal):  Not obtained   Right kidney short axis:  Not obtained   Left kidney long axis (coronal):  Not obtained   Left kidney short axis:  Not obtained   Transverse bladder:  Adequate   Sagittal bladder:  Adequate   Bladder size:  Distended

## 2020-11-08 NOTE — ED Notes (Signed)
Pt ambulatory to triage. Pt's son reports pt has been unable to urinate for about 10 hours and it's painful to urinate. Reports lower abdominal pain. Reports drinking water like normal as well. Pt reports going to PCP about 1.5 weeks ago for UTI. Patient finished whole antibiotic course.

## 2020-11-08 NOTE — Progress Notes (Signed)
Patient discharged on cefpodoxime, will await final culture results

## 2020-11-11 LAB — CULTURE, URINE
Culture result:: >100000
Culture: >100000

## 2021-04-21 ENCOUNTER — Ambulatory Visit
Admit: 2021-04-21 | Discharge: 2021-04-21 | Payer: MEDICARE | Attending: Otolaryngology/Facial Plastic Surgery | Primary: Family Medicine

## 2021-04-21 DIAGNOSIS — H9202 Otalgia, left ear: Secondary | ICD-10-CM

## 2021-04-21 NOTE — Progress Notes (Signed)
Chief Complaint   Patient presents with    Follow-up     Right ear pain and drainage       HPI:  Austin Wood is a 87 y.o. male seen in follow-up for left-sided otalgia and otorrhea.  He has a known left TM perforation and I had seen him most recently back in December 2021.  He has known SNHL and wears an aid on the right side.  He reports pain in the left ear for the last week or so and there is more moisture as well.  No drainage or pain on the right side. 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     Past Medical History, Past Surgical History, Family history, Social History, and Medications were all reviewed with the patient today and updated as necessary.     Allergies   Allergen Reactions    Clindamycin Hives     Other reaction(s): Myalgia-Intolerance  stomach pain     Aspirin Swelling    Hyoscyamine Other (See Comments)    Sulfa Antibiotics Rash     There is no problem list on file for this patient.    Current Outpatient Medications   Medication Sig    melatonin 3 MG TABS tablet Take 3 mg by mouth daily    warfarin (COUMADIN) 1 MG tablet Take 1 mg by mouth    mirtazapine (REMERON) 15 MG tablet Take 15 mg by mouth nightly    atorvastatin (LIPITOR) 10 MG tablet TAKE 1 TABLET BY MOUTH EVERY DAY    ciprofloxacin-dexamethasone (CIPRODEX) 0.3-0.1 % otic suspension Place 4 drops in ear(s) 2 times daily    furosemide (LASIX) 20 MG tablet TAKE 1 TABLET BY MOUTH EVERY DAY    levothyroxine (SYNTHROID) 50 MCG tablet TAKE 1 TABLET (50 MCG) BY MOUTH DAILY    magnesium oxide (MAG-OX) 400 (240 Mg) MG tablet TAKE 1 TABLET BY MOUTH EVERY DAY    metoprolol succinate (TOPROL XL) 50 MG extended release tablet TAKE 1 TABLET BY MOUTH EVERY DAY    tamsulosin (FLOMAX) 0.4 MG capsule TAKE 1 CAPSULE BY MOUTH EVERY DAY     No current facility-administered medications for this visit.     Past Medical History:   Diagnosis Date    Cancer 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     Social History     Tobacco Use    Smoking status: Former     Packs/day: 0.25     Years: 5.00     Pack years: 1.25     Types: Cigarettes, Cigars     Start date: 07/04/1948     Quit date: 07/04/1968     Years since quitting: 52.8    Smokeless tobacco: Never    Tobacco comments:     Very light use.   Substance Use Topics    Alcohol use: Not Currently     Past Surgical History:   Procedure Laterality Date    PALATE SURGERY      TONSILLECTOMY  1931     No family history on file.     ROS:    Review of Systems   HENT:  Positive for ear discharge and ear pain.    Eyes: Negative.    Respiratory: Negative.  Cardiovascular: Negative.    Gastrointestinal: Negative.    Endocrine: Negative.    Genitourinary: Negative.    Musculoskeletal: Negative.    Skin: Negative.    Allergic/Immunologic: Negative.    Neurological: Negative.    Hematological: Negative.    Psychiatric/Behavioral: Negative.        PHYSICAL EXAM:    Ht 5\' 11"  (1.803 m)    Wt 148 lb (67.1 kg)    BMI 20.64 kg/m??     General: NAD, well-appearing  Neuro: No gross neuro deficits. No facial weakness.  Eyes: No periorbital edema/ecchymosis. No nystagmus.  Skin: No facial erythema, rashes or concerning lesions.  Nose: No external deviations or saddling. Intranasally, septum is midline without perforations, nasal mucosa appears healthy with no erythema, mucopurulence, or polyps.  Mouth: Otburator in place. Poor remaining dentition. Changes c/w RT along pharynx w/ telangiectasias- no concerning masses or lesions.   Ears: Normal appearing auricles, no hematomas. R side- BTE aid in place, mild cerumen (removed), dry canal skin, intact TM, clear ME space. L side- clear laterally but there is crusting along floor of EAC which was debrided, TM has chronic appearing 25-30% central perforation w/ mild moisture in ME space- CSF powder applied.  Neck: Soft, supple, no palpable  neck masses. No palpable parotid or submandibular masses. No thyromegaly or palpable thyroid nodules.   Lymphatics: No palpable cervical LAD.  Resp: No audible stridor or wheezing.  Extremities: No clubbing or cyanosis.      ASSESSMENT and PLAN      ICD-10-CM    1. Otalgia, left  H92.02       2. Tympanic membrane perforation, left  H72.92         He had some crusting along the floor of the left ear canal and there was some moisture within the left middle ear space.  He continues to have a chronic appearing 30% central left TM perforation.  I debrided the left ear and applied some CSF powder.  They will use Cortisporin eardrops for 3 days to help clear up any infection in the left ear. RTC prn.    Marijo File, MD  04/21/2021    Electronically signed by Marijo File, MD on 04/21/2021 at 4:29 PM

## 2021-07-29 ENCOUNTER — Encounter (HOSPITAL_COMMUNITY): Payer: Self-pay

## 2021-07-29 ENCOUNTER — Other Ambulatory Visit: Payer: Self-pay

## 2021-07-29 ENCOUNTER — Inpatient Hospital Stay (HOSPITAL_COMMUNITY)
Admission: EM | Admit: 2021-07-29 | Discharge: 2021-08-06 | DRG: 698 | Disposition: A | Discharge status: Skilled Nursing Facility | Payer: Medicare Other | Source: Skilled Nursing Facility | Attending: Family Medicine | Admitting: Family Medicine

## 2021-07-29 ENCOUNTER — Emergency Department (HOSPITAL_COMMUNITY): Payer: Medicare Other

## 2021-07-29 ENCOUNTER — Inpatient Hospital Stay (HOSPITAL_COMMUNITY): Payer: Medicare Other

## 2021-07-29 DIAGNOSIS — T83511A Infection and inflammatory reaction due to indwelling urethral catheter, initial encounter: Secondary | ICD-10-CM | POA: Diagnosis present

## 2021-07-29 DIAGNOSIS — C679 Malignant neoplasm of bladder, unspecified: Secondary | ICD-10-CM | POA: Diagnosis not present

## 2021-07-29 DIAGNOSIS — R64 Cachexia: Secondary | ICD-10-CM | POA: Diagnosis present

## 2021-07-29 DIAGNOSIS — I482 Chronic atrial fibrillation, unspecified: Secondary | ICD-10-CM | POA: Diagnosis present

## 2021-07-29 DIAGNOSIS — M25551 Pain in right hip: Secondary | ICD-10-CM | POA: Diagnosis present

## 2021-07-29 DIAGNOSIS — E039 Hypothyroidism, unspecified: Secondary | ICD-10-CM | POA: Diagnosis present

## 2021-07-29 DIAGNOSIS — R791 Abnormal coagulation profile: Secondary | ICD-10-CM | POA: Diagnosis present

## 2021-07-29 DIAGNOSIS — Z79890 Hormone replacement therapy (postmenopausal): Secondary | ICD-10-CM

## 2021-07-29 DIAGNOSIS — Z20822 Contact with and (suspected) exposure to covid-19: Secondary | ICD-10-CM | POA: Diagnosis present

## 2021-07-29 DIAGNOSIS — I959 Hypotension, unspecified: Secondary | ICD-10-CM | POA: Diagnosis present

## 2021-07-29 DIAGNOSIS — R627 Adult failure to thrive: Secondary | ICD-10-CM | POA: Diagnosis present

## 2021-07-29 DIAGNOSIS — B3749 Other urogenital candidiasis: Secondary | ICD-10-CM | POA: Diagnosis present

## 2021-07-29 DIAGNOSIS — R339 Retention of urine, unspecified: Secondary | ICD-10-CM | POA: Diagnosis present

## 2021-07-29 DIAGNOSIS — E785 Hyperlipidemia, unspecified: Secondary | ICD-10-CM | POA: Diagnosis present

## 2021-07-29 DIAGNOSIS — J189 Pneumonia, unspecified organism: Secondary | ICD-10-CM | POA: Diagnosis not present

## 2021-07-29 DIAGNOSIS — Z9079 Acquired absence of other genital organ(s): Secondary | ICD-10-CM

## 2021-07-29 DIAGNOSIS — L89151 Pressure ulcer of sacral region, stage 1: Secondary | ICD-10-CM | POA: Diagnosis present

## 2021-07-29 DIAGNOSIS — H9190 Unspecified hearing loss, unspecified ear: Secondary | ICD-10-CM | POA: Diagnosis present

## 2021-07-29 DIAGNOSIS — J690 Pneumonitis due to inhalation of food and vomit: Secondary | ICD-10-CM | POA: Diagnosis present

## 2021-07-29 DIAGNOSIS — Z6820 Body mass index (BMI) 20.0-20.9, adult: Secondary | ICD-10-CM | POA: Diagnosis not present

## 2021-07-29 DIAGNOSIS — R1314 Dysphagia, pharyngoesophageal phase: Secondary | ICD-10-CM | POA: Diagnosis present

## 2021-07-29 DIAGNOSIS — I10 Essential (primary) hypertension: Secondary | ICD-10-CM | POA: Diagnosis present

## 2021-07-29 DIAGNOSIS — Y732 Prosthetic and other implants, materials and accessory gastroenterology and urology devices associated with adverse incidents: Secondary | ICD-10-CM | POA: Diagnosis present

## 2021-07-29 DIAGNOSIS — L98429 Non-pressure chronic ulcer of back with unspecified severity: Secondary | ICD-10-CM | POA: Diagnosis not present

## 2021-07-29 DIAGNOSIS — Z9221 Personal history of antineoplastic chemotherapy: Secondary | ICD-10-CM

## 2021-07-29 DIAGNOSIS — Z96641 Presence of right artificial hip joint: Secondary | ICD-10-CM | POA: Diagnosis present

## 2021-07-29 DIAGNOSIS — Z66 Do not resuscitate: Secondary | ICD-10-CM | POA: Diagnosis present

## 2021-07-29 DIAGNOSIS — I4891 Unspecified atrial fibrillation: Secondary | ICD-10-CM | POA: Diagnosis not present

## 2021-07-29 DIAGNOSIS — N390 Urinary tract infection, site not specified: Secondary | ICD-10-CM | POA: Diagnosis present

## 2021-07-29 DIAGNOSIS — Z9181 History of falling: Secondary | ICD-10-CM

## 2021-07-29 DIAGNOSIS — Z85818 Personal history of malignant neoplasm of other sites of lip, oral cavity, and pharynx: Secondary | ICD-10-CM | POA: Diagnosis not present

## 2021-07-29 DIAGNOSIS — Z8551 Personal history of malignant neoplasm of bladder: Secondary | ICD-10-CM | POA: Diagnosis not present

## 2021-07-29 DIAGNOSIS — Z7901 Long term (current) use of anticoagulants: Secondary | ICD-10-CM

## 2021-07-29 DIAGNOSIS — Z923 Personal history of irradiation: Secondary | ICD-10-CM

## 2021-07-29 DIAGNOSIS — Z79899 Other long term (current) drug therapy: Secondary | ICD-10-CM

## 2021-07-29 DIAGNOSIS — R300 Dysuria: Secondary | ICD-10-CM | POA: Diagnosis present

## 2021-07-29 DIAGNOSIS — R52 Pain, unspecified: Secondary | ICD-10-CM

## 2021-07-29 DIAGNOSIS — B3742 Candidal balanitis: Secondary | ICD-10-CM | POA: Diagnosis not present

## 2021-07-29 DIAGNOSIS — K5900 Constipation, unspecified: Secondary | ICD-10-CM | POA: Diagnosis present

## 2021-07-29 DIAGNOSIS — Z87891 Personal history of nicotine dependence: Secondary | ICD-10-CM

## 2021-07-29 HISTORY — DX: Thyrotoxicosis, unspecified without thyrotoxic crisis or storm: E05.90

## 2021-07-29 HISTORY — DX: Hyperlipidemia, unspecified: E78.5

## 2021-07-29 HISTORY — DX: Essential (primary) hypertension: I10

## 2021-07-29 HISTORY — DX: Unspecified atrial fibrillation: I48.91

## 2021-07-29 LAB — CBC WITH DIFFERENTIAL/PLATELET
Abs Immature Granulocytes: 0.06 10*3/uL (ref 0.00–0.07)
Basophils Absolute: 0 10*3/uL (ref 0.0–0.1)
Basophils Relative: 0 %
Eosinophils Absolute: 0 10*3/uL (ref 0.0–0.5)
Eosinophils Relative: 0 %
HCT: 40.2 % (ref 39.0–52.0)
Hemoglobin: 13.4 g/dL (ref 13.0–17.0)
Immature Granulocytes: 0 %
Lymphocytes Relative: 11 %
Lymphs Abs: 1.5 10*3/uL (ref 0.7–4.0)
MCH: 36.3 pg — ABNORMAL HIGH (ref 26.0–34.0)
MCHC: 33.3 g/dL (ref 30.0–36.0)
MCV: 108.9 fL — ABNORMAL HIGH (ref 80.0–100.0)
Monocytes Absolute: 1.3 10*3/uL — ABNORMAL HIGH (ref 0.1–1.0)
Monocytes Relative: 9 %
Neutro Abs: 11.8 10*3/uL — ABNORMAL HIGH (ref 1.7–7.7)
Neutrophils Relative %: 80 %
Platelets: 204 10*3/uL (ref 150–400)
RBC: 3.69 MIL/uL — ABNORMAL LOW (ref 4.22–5.81)
RDW: 13.2 % (ref 11.5–15.5)
WBC: 14.7 10*3/uL — ABNORMAL HIGH (ref 4.0–10.5)
nRBC: 0 % (ref 0.0–0.2)

## 2021-07-29 LAB — COMPREHENSIVE METABOLIC PANEL
ALT: 20 U/L (ref 0–44)
AST: 31 U/L (ref 15–41)
Albumin: 3.9 g/dL (ref 3.5–5.0)
Alkaline Phosphatase: 79 U/L (ref 38–126)
Anion gap: 11 (ref 5–15)
BUN: 41 mg/dL — ABNORMAL HIGH (ref 8–23)
CO2: 26 mmol/L (ref 22–32)
Calcium: 11 mg/dL — ABNORMAL HIGH (ref 8.9–10.3)
Chloride: 100 mmol/L (ref 98–111)
Creatinine, Ser: 1.11 mg/dL (ref 0.61–1.24)
GFR, Estimated: >60 mL/min (ref >60)
Glucose, Bld: 152 mg/dL — ABNORMAL HIGH (ref 70–99)
Potassium: 3.9 mmol/L (ref 3.5–5.1)
Sodium: 137 mmol/L (ref 135–145)
Total Bilirubin: 1.2 mg/dL (ref 0.3–1.2)
Total Protein: 7.6 g/dL (ref 6.5–8.1)

## 2021-07-29 LAB — PROTIME-INR
INR: 5.1 (ref 0.8–1.2)
Prothrombin Time: 47.2 seconds — ABNORMAL HIGH (ref 11.4–15.2)

## 2021-07-29 LAB — TSH: TSH: 1.439 u[IU]/mL (ref 0.350–4.500)

## 2021-07-29 LAB — URINALYSIS, MICROSCOPIC (REFLEX)

## 2021-07-29 LAB — URINALYSIS, ROUTINE W REFLEX MICROSCOPIC
Glucose, UA: 100 mg/dL — AB
Ketones, ur: NEGATIVE mg/dL
Nitrite: POSITIVE — AB
Protein, ur: 30 mg/dL — AB
Specific Gravity, Urine: 1.01 (ref 1.005–1.030)
pH: 6.5 (ref 5.0–8.0)

## 2021-07-29 LAB — RESP PANEL BY RT-PCR (FLU A&B, COVID) ARPGX2
Influenza A by PCR: NEGATIVE
Influenza B by PCR: NEGATIVE
SARS Coronavirus 2 by RT PCR: NEGATIVE

## 2021-07-29 LAB — LACTIC ACID, PLASMA: Lactic Acid, Venous: 1.8 mmol/L (ref 0.5–1.9)

## 2021-07-29 MED ORDER — CEFTRIAXONE SODIUM 1 G IJ SOLR
1.0000 g | Freq: Once | INTRAMUSCULAR | Status: AC
  Administered 2021-07-29: 1 g via INTRAVENOUS
  Filled 2021-07-29: qty 10

## 2021-07-29 MED ORDER — SODIUM CHLORIDE 0.9 % IV SOLN
500.0000 mg | INTRAVENOUS | Status: DC
  Administered 2021-07-30 – 2021-08-02 (×4): 500 mg via INTRAVENOUS
  Filled 2021-07-29 (×4): qty 5

## 2021-07-29 MED ORDER — HYDROCODONE-ACETAMINOPHEN 5-325 MG PO TABS
1.0000 | ORAL_TABLET | Freq: Four times a day (QID) | ORAL | Status: DC | PRN
  Administered 2021-07-30 – 2021-08-06 (×5): 1 via ORAL
  Filled 2021-07-29 (×5): qty 1

## 2021-07-29 MED ORDER — CLOTRIMAZOLE 1 % EX CREA
TOPICAL_CREAM | Freq: Two times a day (BID) | CUTANEOUS | Status: DC
  Administered 2021-07-29 – 2021-07-30 (×2): 1 via TOPICAL
  Filled 2021-07-29 (×2): qty 15

## 2021-07-29 MED ORDER — SODIUM CHLORIDE 0.9 % IV SOLN
2.0000 g | INTRAVENOUS | Status: DC
  Administered 2021-07-30 – 2021-07-31 (×2): 2 g via INTRAVENOUS
  Filled 2021-07-29 (×2): qty 2

## 2021-07-29 MED ORDER — LACTATED RINGERS IV BOLUS
1000.0000 mL | Freq: Once | INTRAVENOUS | Status: AC
  Administered 2021-07-29: 1000 mL via INTRAVENOUS

## 2021-07-29 MED ORDER — SODIUM CHLORIDE 0.9 % IV SOLN
500.0000 mg | Freq: Once | INTRAVENOUS | Status: AC
  Administered 2021-07-29: 500 mg via INTRAVENOUS
  Filled 2021-07-29: qty 5

## 2021-07-29 NOTE — ED Triage Notes (Signed)
Pt is arriving from [facility] via EMS. Pt c/o genital pain increasing over the last two weeks and more lethargy. He is also c/o constipation for the last 9 days, and lack of appetite.   Hx UTI. A&O x 4. HOH. Pt is warm to the touch.  Pt was given 1000mg  of tylenol with EMS for fever.

## 2021-07-29 NOTE — H&P (Incomplete)
History and Physical    Craig Snow NGE:[id number] DOB: 1920-09-24 DOA: 07/29/2021  PCP: Pcp, No  Patient coming from: ***  I have personally briefly reviewed patient's old medical records in [place]  Chief Complaint: ***  HPI: Craig Snow is a 88 y.o. male with medical history significant for   ED Course: ***  Review of Systems:    Past Medical History:  Diagnosis Date   Atrial fibrillation ([place])    Hyperlipidemia    Hypertension    Thyrotoxicosis     History reviewed. No pertinent surgical history.   reports that he quit smoking about 50 years ago. His smoking use included cigarettes. He has never used smokeless tobacco. He reports that he does not currently use alcohol. He reports that he does not currently use drugs. Social History  Allergies  Allergen Reactions   Aspirin    Clindamycin/Lincomycin    Sulfa Antibiotics     History reviewed. No pertinent family history.   Prior to Admission medications   Not on File    Physical Exam: Vitals:   07/29/21 1947 07/29/21 2000 07/29/21 2015 07/29/21 2045  BP:  112/74 (!) 100/53 (!) 97/53  Pulse: 81 73 72 72  Resp: 18 18 10  (!) 24  Temp:      TempSrc:      SpO2: 96% 92% 96% 96%  Weight:      Height:        Constitutional: NAD, calm, comfortable Vitals:   07/29/21 1947 07/29/21 2000 07/29/21 2015 07/29/21 2045  BP:  112/74 (!) 100/53 (!) 97/53  Pulse: 81 73 72 72  Resp: 18 18 10  (!) 24  Temp:      TempSrc:      SpO2: 96% 92% 96% 96%  Weight:      Height:       Eyes: PERRL, lids and conjunctivae normal ENMT: Mucous membranes are moist. Posterior pharynx clear of any exudate or lesions.Normal dentition.  Neck: normal, supple, no masses, no thyromegaly Respiratory: clear to auscultation bilaterally, no wheezing, no crackles. Normal respiratory effort. No accessory muscle use.  Cardiovascular: Regular rate and rhythm, no murmurs / rubs / gallops. No extremity edema. 2+ pedal pulses. No  carotid bruits.  Abdomen: no tenderness, no masses palpated. No hepatosplenomegaly. Bowel sounds positive.  Musculoskeletal: no clubbing / cyanosis. No joint deformity upper and lower extremities. Good ROM, no contractures. Normal muscle tone.  Skin: no rashes, lesions, ulcers. No induration Neurologic: CN 2-12 grossly intact. Sensation intact, DTR normal. Strength 5/5 in all 4.  Psychiatric: Normal judgment and insight. Alert and oriented x 3. Normal mood.   (Anything < 9 systems with 2 bullets each down codes to level 1) (If patient refuses exam cant bill higher level) (Make sure to document decubitus ulcers present on admission -- if possible -- and whether patient has chronic indwelling catheter at time of admission)  Labs on Admission: I have personally reviewed following labs and imaging studies  CBC: Recent Labs  Lab 07/29/21 2000  WBC 14.7*  NEUTROABS 11.8*  HGB 13.4  HCT 40.2  MCV 108.9*  PLT 401   Basic Metabolic Panel: Recent Labs  Lab 07/29/21 2000  NA 137  K 3.9  CL 100  CO2 26  GLUCOSE 152*  BUN 41*  CREATININE 1.11  CALCIUM 11.0*   GFR: Estimated Creatinine Clearance: 34.6 mL/min (by C-G formula based on SCr of 1.11 mg/dL). Liver Function Tests: Recent Labs  Lab 07/29/21 2000  AST 31  ALT 20  ALKPHOS 79  BILITOT 1.2  PROT 7.6  ALBUMIN 3.9   No results for input(s): LIPASE, AMYLASE in the last 168 hours. No results for input(s): AMMONIA in the last 168 hours. Coagulation Profile: Recent Labs  Lab 07/29/21 2000  INR 5.1*   Cardiac Enzymes: No results for input(s): CKTOTAL, CKMB, CKMBINDEX, TROPONINI in the last 168 hours. BNP (last 3 results) No results for input(s): PROBNP in the last 8760 hours. HbA1C: No results for input(s): HGBA1C in the last 72 hours. CBG: No results for input(s): GLUCAP in the last 168 hours. Lipid Profile: No results for input(s): CHOL, HDL, LDLCALC, TRIG, CHOLHDL, LDLDIRECT in the last 72 hours. Thyroid  Function Tests: No results for input(s): TSH, T4TOTAL, FREET4, T3FREE, THYROIDAB in the last 72 hours. Anemia Panel: No results for input(s): VITAMINB12, FOLATE, FERRITIN, TIBC, IRON, RETICCTPCT in the last 72 hours. Urine analysis:    Component Value Date/Time   COLORURINE YELLOW 07/29/2021 2014   APPEARANCEUR CLEAR 07/29/2021 2014   LABSPEC 1.010 07/29/2021 2014   PHURINE 6.5 07/29/2021 2014   GLUCOSEU 100 (A) 07/29/2021 2014   HGBUR SMALL (A) 07/29/2021 2014   BILIRUBINUR SMALL (A) 07/29/2021 2014   [place] NEGATIVE 07/29/2021 2014   PROTEINUR 30 (A) 07/29/2021 2014   NITRITE POSITIVE (A) 07/29/2021 2014   LEUKOCYTESUR LARGE (A) 07/29/2021 2014    Radiological Exams on Admission: DG Chest 2 View  Result Date: 07/29/2021 CLINICAL DATA:  Suspected Sepsis Lethargy.  Fever. EXAM: CHEST - 2 VIEW COMPARISON:  None. FINDINGS: The heart is normal in size. Normal mediastinal contours. Aortic atherosclerosis. Patchy left greater than right basilar opacities with increased retrocardiac density on the lateral view. No pulmonary edema, pneumothorax, or significant pleural effusion. No acute osseous abnormalities are seen IMPRESSION: Patchy left greater than right basilar opacities concerning for pneumonia in the setting of fever. Electronically Signed   By: Keith Rake M.D.   On: 07/29/2021 19:35      Assessment/Plan Principal Problem:   Atrial fibrillation with RVR ([place])  (please populate well all problems here in Problem List. (For example, if patient is on BP meds at home and you resume or decide to hold them, it is a problem that needs to be her. Same for CAD, COPD, HLD and so on)   ***  DVT prophylaxis: *** (Lovenox/Heparin/SCD's/anticoagulated/None (if comfort care) Code Status: *** (Full/Partial (specify details) Family Communication: *** (Specify name, relationship. Do not write "discussed with patient". Specify tel # if discussed over the phone) Disposition Plan: ***  (specify when and where you expect patient to be discharged) Consults called: *** (with names) Admission status: *** (inpatient / obs / tele / medical floor / SDU)  Level of care: Telemetry  Status is: Inpatient  {Inpatient:23812}         Orene Desanctis DO [facility]   If 7PM-7AM, please contact night-coverage [email or web address]   07/29/2021, 9:08 PM

## 2021-07-29 NOTE — Progress Notes (Signed)
Pharmacy Antibiotic Note  Craig Snow is a 88 y.o. male admitted on 07/29/2021 with PMH atrial fibrillation on Coumadin, HLD, HTN, previous thyrotoxicosis, urinary retention with urinary catheter placement who presents the emergency department for evaluation of multiple complaints including fever, constipation, cough and genital pain.  Pharmacy has been consulted to dose cefepime for UTI  Plan: Cefepime 2gm IV q24h  Height: 5\' 11"  (180.3 cm) Weight: 65.8 kg (145 lb) IBW/kg (Calculated) : 75.3  Temp (24hrs), Avg:99.8 F (37.7 C), Min:99.8 F (37.7 C), Max:99.8 F (37.7 C)  Recent Labs  Lab 07/29/21 2000  WBC 14.7*  CREATININE 1.11  LATICACIDVEN 1.8    Estimated Creatinine Clearance: 34.6 mL/min (by C-G formula based on SCr of 1.11 mg/dL).    Allergies  Allergen Reactions   Aspirin     unknown   Clindamycin/Lincomycin     unknown   Sulfa Antibiotics     unknown    Antimicrobials this admission: 1/26 CTX x 1 1/26 azith >> 1/27 cefepime >>  Dose adjustments this admission:   Microbiology results: 1/26 BCx:  1/26 UCx:   Thank you for allowing pharmacy to be a part of this patients care.  Dolly Rias RPh 07/29/2021, 11:09 PM

## 2021-07-29 NOTE — ED Provider Notes (Signed)
[facility] DEPT Provider Note  CSN: [id number] Arrival date & time: 07/29/21 1828  Chief Complaint(s) Fever, Constipation, and genital pain  HPI Craig Snow is a 88 y.o. male with PMH atrial fibrillation on Coumadin, HLD, HTN, previous thyrotoxicosis, urinary retention with urinary catheter placement who presents the emergency department for evaluation of multiple complaints including fever, constipation, cough and genital pain.  History obtained from patient's son as the patient is extremely hard of hearing who states that the patient is coming from [place] assisted living with complaints of increasing general pain over the last 2 weeks, constipation for the last 9 days, cough and fever.  Patient has apparently been seen by nurse practitioner twice for a Foley catheter exchange but his pain has significantly worsened bring him to the emergency department.  Patient arrives with erythema to the penis and perineum and into the groin folds on the right.  Patient arrives in A. fib with accelerated ventricular rate.   Fever Associated symptoms: cough   Constipation Associated symptoms: fever    Past Medical History Past Medical History:  Diagnosis Date   Atrial fibrillation ([place])    Hyperlipidemia    Hypertension    Thyrotoxicosis    There are no problems to display for this patient.  Home Medication(s) Prior to Admission medications   Not on File                                                                                                                                    Past Surgical History History reviewed. No pertinent surgical history. Family History History reviewed. No pertinent family history.  Social History Social History   Tobacco Use   Smoking status: Former    Types: Cigarettes    Quit date: 1973    Years since quitting: 50.1   Smokeless tobacco: Never  Substance Use Topics   Alcohol use: Not Currently   Drug use: Not  Currently   Allergies Aspirin, Clindamycin/lincomycin, and Sulfa antibiotics  Review of Systems Review of Systems  Constitutional:  Positive for fever.  Respiratory:  Positive for cough.   Gastrointestinal:  Positive for constipation.  Genitourinary:  Positive for penile pain.   Physical Exam Vital Signs  I have reviewed the triage vital signs BP (!) 97/53    Pulse 72    Temp 99.8 F (37.7 C) (Rectal)    Resp (!) 24    Ht 5\' 11"  (1.803 m)    Wt 65.8 kg    SpO2 96%    BMI 20.22 kg/m   Physical Exam Vitals and nursing note reviewed.  Constitutional:      General: He is not in acute distress.    Appearance: He is well-developed.  HENT:     Head: Normocephalic and atraumatic.  Eyes:     Conjunctiva/sclera: Conjunctivae normal.  Cardiovascular:     Rate and Rhythm: Normal rate and regular rhythm.  Heart sounds: No murmur heard. Pulmonary:     Effort: Pulmonary effort is normal. No respiratory distress.     Breath sounds: Rales present.  Abdominal:     Palpations: Abdomen is soft.     Tenderness: There is no abdominal tenderness.  Musculoskeletal:        General: No swelling.     Cervical back: Neck supple.  Skin:    General: Skin is warm and dry.     Capillary Refill: Capillary refill takes less than 2 seconds.     Findings: Rash (Erythema over the shaft of the penis and into the perineum into the right groin folds) present.  Neurological:     Mental Status: He is alert.  Psychiatric:        Mood and Affect: Mood normal.    ED Results and Treatments Labs (all labs ordered are listed, but only abnormal results are displayed) Labs Reviewed  COMPREHENSIVE METABOLIC PANEL - Abnormal; Notable for the following components:      Result Value   Glucose, Bld 152 (*)    BUN 41 (*)    Calcium 11.0 (*)    All other components within normal limits  CBC WITH DIFFERENTIAL/PLATELET - Abnormal; Notable for the following components:   WBC 14.7 (*)    RBC 3.69 (*)    MCV  108.9 (*)    MCH 36.3 (*)    Neutro Abs 11.8 (*)    Monocytes Absolute 1.3 (*)    All other components within normal limits  PROTIME-INR - Abnormal; Notable for the following components:   Prothrombin Time 47.2 (*)    INR 5.1 (*)    All other components within normal limits  URINALYSIS, ROUTINE W REFLEX MICROSCOPIC - Abnormal; Notable for the following components:   Glucose, UA 100 (*)    Hgb urine dipstick SMALL (*)    Bilirubin Urine SMALL (*)    Protein, ur 30 (*)    Nitrite POSITIVE (*)    Leukocytes,Ua LARGE (*)    All other components within normal limits  URINALYSIS, MICROSCOPIC (REFLEX) - Abnormal; Notable for the following components:   Bacteria, UA FEW (*)    All other components within normal limits  CULTURE, BLOOD (ROUTINE X 2)  CULTURE, BLOOD (ROUTINE X 2)  URINE CULTURE  LACTIC ACID, PLASMA  LACTIC ACID, PLASMA  TSH                                                                                                                          Radiology DG Chest 2 View  Result Date: 07/29/2021 CLINICAL DATA:  Suspected Sepsis Lethargy.  Fever. EXAM: CHEST - 2 VIEW COMPARISON:  None. FINDINGS: The heart is normal in size. Normal mediastinal contours. Aortic atherosclerosis. Patchy left greater than right basilar opacities with increased retrocardiac density on the lateral view. No pulmonary edema, pneumothorax, or significant pleural effusion. No acute osseous abnormalities are seen IMPRESSION: Patchy left greater than right basilar opacities  concerning for pneumonia in the setting of fever. Electronically Signed   By: Keith Rake M.D.   On: 07/29/2021 19:35    Pertinent labs & imaging results that were available during my care of the patient were reviewed by me and considered in my medical decision making (see MDM for details).  Medications Ordered in ED Medications  clotrimazole (LOTRIMIN) 1 % cream (has no administration in time range)  azithromycin (ZITHROMAX) 500  mg in sodium chloride 0.9 % 250 mL IVPB (500 mg Intravenous New Bag/Given 07/29/21 2014)  lactated ringers bolus 1,000 mL (has no administration in time range)  cefTRIAXone (ROCEPHIN) 1 g in sodium chloride 0.9 % 100 mL IVPB (1 g Intravenous New Bag/Given 07/29/21 2009)                                                                                                                                     Procedures .Critical Care Performed by: Teressa Lower, MD Authorized by: Teressa Lower, MD   Critical care provider statement:    Critical care time (minutes):  30   Critical care was necessary to treat or prevent imminent or life-threatening deterioration of the following conditions:  Sepsis   Critical care was time spent personally by me on the following activities:  Development of treatment plan with patient or surrogate, discussions with consultants, evaluation of patient's response to treatment, examination of patient, ordering and review of laboratory studies, ordering and review of radiographic studies, ordering and performing treatments and interventions, pulse oximetry, re-evaluation of patient's condition and review of old charts  (including critical care time)  Medical Decision Making / ED Course   This patient presents to the ED for concern of penile pain, constipation, cough, fever, this involves an extensive number of treatment options, and is a complaint that carries with it a high risk of complications and morbidity.  The differential diagnosis includes cyst, pneumonia, Fournier's gangrene, fungal infection, COVID-19, influenza  MDM: Patient seen emergency department for evaluation of multiple complaints as described above.  Physical exam reveals a erythema to the penile shaft and into the perineum on the right that is consistent with a fungal infection.  Suspect fungal infection likely due to a leaking around the urinary catheter and skin irritation from sitting in urine.   Laboratory evaluation with a leukocytosis to 14.7, INR elevated to 5.1, urinalysis with positive nitrites, large leuk esterase, 11-20 white blood cells and few bacteria.  Lactate normal at 1.8.  Patient fluid resuscitated with 1 L lactated Ringer's and patient's heart rate improved to rate controlled A. fib.  Chest x-ray with pneumonia.  Patient started on ceftriaxone azithromycin for both his urinary tract infection and his pneumonia.  Clotrimazole cream applied to the fungal infection as I cannot ensure that the patient's fungal infection is candidal and nystatin may not cover a tinea infection..  Patient will require admission for the multiple problems described above.  Patient then admitted.   Additional history obtained: -Additional history obtained from son -External records from outside source obtained and reviewed including: Chart review including previous notes, labs, imaging, consultation notes   Lab Tests: -I ordered, reviewed, and interpreted labs.   The pertinent results include:   Labs Reviewed  COMPREHENSIVE METABOLIC PANEL - Abnormal; Notable for the following components:      Result Value   Glucose, Bld 152 (*)    BUN 41 (*)    Calcium 11.0 (*)    All other components within normal limits  CBC WITH DIFFERENTIAL/PLATELET - Abnormal; Notable for the following components:   WBC 14.7 (*)    RBC 3.69 (*)    MCV 108.9 (*)    MCH 36.3 (*)    Neutro Abs 11.8 (*)    Monocytes Absolute 1.3 (*)    All other components within normal limits  PROTIME-INR - Abnormal; Notable for the following components:   Prothrombin Time 47.2 (*)    INR 5.1 (*)    All other components within normal limits  URINALYSIS, ROUTINE W REFLEX MICROSCOPIC - Abnormal; Notable for the following components:   Glucose, UA 100 (*)    Hgb urine dipstick SMALL (*)    Bilirubin Urine SMALL (*)    Protein, ur 30 (*)    Nitrite POSITIVE (*)    Leukocytes,Ua LARGE (*)    All other components within normal limits   URINALYSIS, MICROSCOPIC (REFLEX) - Abnormal; Notable for the following components:   Bacteria, UA FEW (*)    All other components within normal limits  CULTURE, BLOOD (ROUTINE X 2)  CULTURE, BLOOD (ROUTINE X 2)  URINE CULTURE  LACTIC ACID, PLASMA  LACTIC ACID, PLASMA  TSH         Imaging Studies ordered: I ordered imaging studies including CXR I independently visualized and interpreted imaging. I agree with the radiologist interpretation   Medicines ordered and prescription drug management: Meds ordered this encounter  Medications   clotrimazole (LOTRIMIN) 1 % cream   cefTRIAXone (ROCEPHIN) 1 g in sodium chloride 0.9 % 100 mL IVPB    Order Specific Question:   Antibiotic Indication:    Answer:   CAP   azithromycin (ZITHROMAX) 500 mg in sodium chloride 0.9 % 250 mL IVPB    Order Specific Question:   Antibiotic Indication:    Answer:   CAP   lactated ringers bolus 1,000 mL    -I have reviewed the patients home medicines and have made adjustments as needed  Critical interventions Multiple abx, fluid resuscitation  Cardiac Monitoring: The patient was maintained on a cardiac monitor.  I personally viewed and interpreted the cardiac monitored which showed an underlying rhythm of: Atrial fibrillation   Social Determinants of Health:  Factors impacting patients care include: hard of hearing, nursing home pt   Reevaluation: After the interventions noted above, I reevaluated the patient and found that they have :improved  Co morbidities that complicate the patient evaluation  Past Medical History:  Diagnosis Date   Atrial fibrillation ([place])    Hyperlipidemia    Hypertension    Thyrotoxicosis       Dispostion: I considered admission for this patient, and due to his multiple concomitant infections he was admitted.     Final Clinical Impression(s) / ED Diagnoses Final diagnoses:  None     @PCDICTATION @    Teressa Lower, MD 07/29/21 2054

## 2021-07-29 NOTE — H&P (Signed)
History and Physical    Craig Snow IRW:[id number] DOB: August 15, 1920 DOA: 07/29/2021  PCP: Pcp, No  Patient coming from: [facility]  I have personally briefly reviewed patient's old medical records in [place]  Chief Complaint: penile pain  HPI: Craig Snow is a 88 y.o. male with medical history significant for atrial fibrillation on Coumadin, remote history of palate cancer, hypertension, hyperlipidemia, recent diagnosis of bladder cancer with chronic indwelling Foley catheter who presents from ALF with concerns of increasing penile pain.  Son at bedside provides limited history.  Patient unable to give history since he is hard of hearing and speech is difficulty to comprehend due to history of palate cancer.  Reportedly, for the past week he has been having increasing penile pain.  He was recently diagnosed with bladder cancer about 4 to 5 months ago and has chronic indwelling Foley catheter. Currently undergoing BCG therapy. Foley was last changed on 1/16 but since then has dysuria.  He was put on Macrobid for UTI at the the nursing home on 1/20 but no improvement in symptoms.  About 2 days ago, he also had a fall and reports right hip pain. Unclear mechanism of fall.   Patient recently moved from [place], [place] a month so I am unable to see much of his past medical records.  ED Course: He was afebrile, initially in atrial fibrillation with RVR with rates up to 140 and borderline blood pressure.  Heart rate later improved with minimal IV fluids. Leukocytosis of 14.7, hemoglobin of 13.4.  Lactate is normal at 1.8. Sodium of 137, K of 3.9, creatinine of 1.11, BG of 152.  INR of 5.1.  UA shows large leukocyte, positive nitrite and bacteria.  Chest x-ray showing left greater than right patchy infiltrate.  He was started on IV Rocephin and azithromycin.  Hospitalist then called for admission.  Review of Systems:  No other pertinent positives or negatives other than  stated in HPI  Past Medical History:  Diagnosis Date   Atrial fibrillation ([place])    Hyperlipidemia    Hypertension    Thyrotoxicosis     History reviewed. No pertinent surgical history.   reports that he quit smoking about 50 years ago. His smoking use included cigarettes. He has never used smokeless tobacco. He reports that he does not currently use alcohol. He reports that he does not currently use drugs. Social History  Allergies  Allergen Reactions   Aspirin     unknown   Clindamycin/Lincomycin     unknown   Sulfa Antibiotics     unknown    History reviewed. No pertinent family history.   Prior to Admission medications   Medication Sig Start Date End Date Taking? Authorizing Provider  furosemide (LASIX) 40 MG tablet Take 40 mg by mouth.   Yes [provider]    Physical Exam: Vitals:   07/29/21 2015 07/29/21 2045 07/29/21 2100 07/29/21 2130  BP: (!) 100/53 (!) 97/53 (!) 96/54 112/63  Pulse: 72 72 72 70  Resp: 10 (!) 24 (!) 21 15  Temp:      TempSrc:      SpO2: 96% 96% 98% 98%  Weight:      Height:        Constitutional: elderly thin cachetic male laying flat in bed. Pt is has hearing impairment and muffled speech from remote oral surgery. Vitals:   07/29/21 2015 07/29/21 2045 07/29/21 2100 07/29/21 2130  BP: (!) 100/53 (!) 97/53 (!) 96/54 112/63  Pulse: 72 72 72 70  Resp: 10 (!) 24 (!) 21 15  Temp:      TempSrc:      SpO2: 96% 96% 98% 98%  Weight:      Height:       Eyes: lids and conjunctivae normal ENMT: Mucous membranes are moist  Neck: normal, supple Respiratory: Diminished bibasilar lung sounds but no wheezing, no crackles. Normal respiratory effort on room air. No accessory muscle use.  Cardiovascular: Regular rate and rhythm, no murmurs / rubs / gallops. No extremity edema.  Abdomen: Suprapubic tenderness but no mass. Bowel sounds positive.  GU: Erythema noted around the scrotum and penis. Chronic indwelling Foley catheter in place.  Dark brown urine noted in foley bag. Musculoskeletal: no clubbing / cyanosis. No joint deformity upper and lower extremities.  Normal muscle tone.  Skin: no rashes, lesions, ulcers. No induration Neurologic: CN 2-12 grossly intact. Strength 5/5 in all 4.  Psychiatric: Normal judgment and insight. Alert and oriented x 3. Normal mood.     Labs on Admission: I have personally reviewed following labs and imaging studies  CBC: Recent Labs  Lab 07/29/21 2000  WBC 14.7*  NEUTROABS 11.8*  HGB 13.4  HCT 40.2  MCV 108.9*  PLT 030   Basic Metabolic Panel: Recent Labs  Lab 07/29/21 2000  NA 137  K 3.9  CL 100  CO2 26  GLUCOSE 152*  BUN 41*  CREATININE 1.11  CALCIUM 11.0*   GFR: Estimated Creatinine Clearance: 34.6 mL/min (by C-G formula based on SCr of 1.11 mg/dL). Liver Function Tests: Recent Labs  Lab 07/29/21 2000  AST 31  ALT 20  ALKPHOS 79  BILITOT 1.2  PROT 7.6  ALBUMIN 3.9   No results for input(s): LIPASE, AMYLASE in the last 168 hours. No results for input(s): AMMONIA in the last 168 hours. Coagulation Profile: Recent Labs  Lab 07/29/21 2000  INR 5.1*   Cardiac Enzymes: No results for input(s): CKTOTAL, CKMB, CKMBINDEX, TROPONINI in the last 168 hours. BNP (last 3 results) No results for input(s): PROBNP in the last 8760 hours. HbA1C: No results for input(s): HGBA1C in the last 72 hours. CBG: No results for input(s): GLUCAP in the last 168 hours. Lipid Profile: No results for input(s): CHOL, HDL, LDLCALC, TRIG, CHOLHDL, LDLDIRECT in the last 72 hours. Thyroid Function Tests: Recent Labs    07/29/21 2000  TSH 1.439   Anemia Panel: No results for input(s): VITAMINB12, FOLATE, FERRITIN, TIBC, IRON, RETICCTPCT in the last 72 hours. Urine analysis:    Component Value Date/Time   COLORURINE YELLOW 07/29/2021 2014   APPEARANCEUR CLEAR 07/29/2021 2014   LABSPEC 1.010 07/29/2021 2014   PHURINE 6.5 07/29/2021 2014   GLUCOSEU 100 (A) 07/29/2021 2014    HGBUR SMALL (A) 07/29/2021 2014   BILIRUBINUR SMALL (A) 07/29/2021 2014   [place] NEGATIVE 07/29/2021 2014   PROTEINUR 30 (A) 07/29/2021 2014   NITRITE POSITIVE (A) 07/29/2021 2014   LEUKOCYTESUR LARGE (A) 07/29/2021 2014    Radiological Exams on Admission: DG Chest 2 View  Result Date: 07/29/2021 CLINICAL DATA:  Suspected Sepsis Lethargy.  Fever. EXAM: CHEST - 2 VIEW COMPARISON:  None. FINDINGS: The heart is normal in size. Normal mediastinal contours. Aortic atherosclerosis. Patchy left greater than right basilar opacities with increased retrocardiac density on the lateral view. No pulmonary edema, pneumothorax, or significant pleural effusion. No acute osseous abnormalities are seen IMPRESSION: Patchy left greater than right basilar opacities concerning for pneumonia in the setting of fever.  Electronically Signed   By: Keith Rake M.D.   On: 07/29/2021 19:35      Assessment/Plan  UTI w/ chronic indwelling catheter and bladder cancer - Failed outpatient therapy with Macrobid.  Son also mentions history of colonization but unsure of specific growth from previous culture. -Will switch to cefepime pending urine culture -Needed a catheter exchange tomorrow  Community-acquired pneumonia -Will switch to cefepime and azithromycin to also cover for UTI as stated above  Atrial fibrillation with RVR -Initially presented with rates up to 140 -Resolved with IV fluids -Hold Coumadin due to supratherapeutic INR  SIR criteria -met with tachycardia and leukocytosis but suspect HR due to Atrial fibrillation rather than true sepsis   Supratherapeutic INR INR of 5.  No signs of overt bleeding.  Hold Coumadin.  Check daily INR  Penile candidiasis  - Daily clotrimazole  Stage 1 sacral ulcer  -Apply prophylactic sacral foam dressing   Bladder cancer Undergoing BCG therapy previous with urology in [place]. Has upcoming appt to establish care with local urology soon.  Med rec  is still pending   DVT prophylaxis:.SCD-currently supratherapeutic INR Code Status: DNR-verified with son at bedside Family Communication: Plan discussed with patient and son at bedside.  All questions and concerns were addressed. disposition Plan: Home with at least 2 midnight stays  Consults called:  Admission status: inpatient  Level of care: Telemetry  Status is: Inpatient  Remains inpatient appropriate because: Admit - It is my clinical opinion that admission to INPATIENT is reasonable and necessary because this patient will require at least 2 midnights in the hospital to treat this condition based on the medical complexity of the problems presented.  Given the aforementioned information, the predictability of an adverse outcome is felt to be significant.         Orene Desanctis DO [facility]   If 7PM-7AM, please contact night-coverage [email or web address]   07/29/2021, 9:57 PM

## 2021-07-30 ENCOUNTER — Inpatient Hospital Stay (HOSPITAL_COMMUNITY): Payer: Medicare Other

## 2021-07-30 DIAGNOSIS — N390 Urinary tract infection, site not specified: Secondary | ICD-10-CM

## 2021-07-30 DIAGNOSIS — B3742 Candidal balanitis: Secondary | ICD-10-CM

## 2021-07-30 DIAGNOSIS — C679 Malignant neoplasm of bladder, unspecified: Secondary | ICD-10-CM

## 2021-07-30 DIAGNOSIS — R791 Abnormal coagulation profile: Secondary | ICD-10-CM

## 2021-07-30 DIAGNOSIS — Z8551 Personal history of malignant neoplasm of bladder: Secondary | ICD-10-CM

## 2021-07-30 DIAGNOSIS — J189 Pneumonia, unspecified organism: Secondary | ICD-10-CM

## 2021-07-30 DIAGNOSIS — L98429 Non-pressure chronic ulcer of back with unspecified severity: Secondary | ICD-10-CM

## 2021-07-30 HISTORY — DX: Pneumonia, unspecified organism: J18.9

## 2021-07-30 HISTORY — DX: Abnormal coagulation profile: R79.1

## 2021-07-30 HISTORY — DX: Urinary tract infection, site not specified: N39.0

## 2021-07-30 HISTORY — DX: Candidal balanitis: B37.42

## 2021-07-30 LAB — CBC
HCT: 37.3 % — ABNORMAL LOW (ref 39.0–52.0)
Hemoglobin: 12.5 g/dL — ABNORMAL LOW (ref 13.0–17.0)
MCH: 36.9 pg — ABNORMAL HIGH (ref 26.0–34.0)
MCHC: 33.5 g/dL (ref 30.0–36.0)
MCV: 110 fL — ABNORMAL HIGH (ref 80.0–100.0)
Platelets: 192 10*3/uL (ref 150–400)
RBC: 3.39 MIL/uL — ABNORMAL LOW (ref 4.22–5.81)
RDW: 13.1 % (ref 11.5–15.5)
WBC: 10 10*3/uL (ref 4.0–10.5)
nRBC: 0 % (ref 0.0–0.2)

## 2021-07-30 LAB — PROTIME-INR
INR: 5 (ref 0.8–1.2)
Prothrombin Time: 46.5 seconds — ABNORMAL HIGH (ref 11.4–15.2)

## 2021-07-30 MED ORDER — METOPROLOL TARTRATE 25 MG PO TABS
25.0000 mg | ORAL_TABLET | Freq: Two times a day (BID) | ORAL | Status: DC
  Administered 2021-07-30 – 2021-08-05 (×8): 25 mg via ORAL
  Filled 2021-07-30 (×13): qty 1

## 2021-07-30 MED ORDER — POLYETHYLENE GLYCOL 3350 17 G PO PACK
17.0000 g | PACK | Freq: Every morning | ORAL | Status: DC
  Administered 2021-07-31 – 2021-08-06 (×7): 17 g via ORAL
  Filled 2021-07-30 (×7): qty 1

## 2021-07-30 MED ORDER — ACETAMINOPHEN 325 MG PO TABS
650.0000 mg | ORAL_TABLET | Freq: Four times a day (QID) | ORAL | Status: DC | PRN
  Administered 2021-08-02 – 2021-08-03 (×2): 650 mg via ORAL
  Filled 2021-07-30 (×2): qty 2

## 2021-07-30 MED ORDER — ACETAMINOPHEN 500 MG PO TABS
1000.0000 mg | ORAL_TABLET | Freq: Once | ORAL | Status: AC
  Administered 2021-07-30: 1000 mg via ORAL
  Filled 2021-07-30: qty 2

## 2021-07-30 MED ORDER — PHENAZOPYRIDINE HCL 100 MG PO TABS
200.0000 mg | ORAL_TABLET | Freq: Three times a day (TID) | ORAL | Status: DC | PRN
  Administered 2021-07-31 – 2021-08-04 (×2): 200 mg via ORAL
  Filled 2021-07-30 (×3): qty 2

## 2021-07-30 MED ORDER — IOHEXOL 300 MG/ML  SOLN
100.0000 mL | Freq: Once | INTRAMUSCULAR | Status: AC | PRN
  Administered 2021-07-30: 100 mL via INTRAVENOUS

## 2021-07-30 MED ORDER — MELATONIN 5 MG PO TABS
10.0000 mg | ORAL_TABLET | Freq: Every evening | ORAL | Status: DC | PRN
  Administered 2021-07-31 – 2021-08-03 (×5): 10 mg via ORAL
  Filled 2021-07-30 (×5): qty 2

## 2021-07-30 MED ORDER — TAMSULOSIN HCL 0.4 MG PO CAPS
0.4000 mg | ORAL_CAPSULE | Freq: Every morning | ORAL | Status: DC
  Administered 2021-07-31 – 2021-08-06 (×7): 0.4 mg via ORAL
  Filled 2021-07-30 (×7): qty 1

## 2021-07-30 MED ORDER — LACTATED RINGERS IV BOLUS
1000.0000 mL | Freq: Once | INTRAVENOUS | Status: AC
  Administered 2021-07-30: 1000 mL via INTRAVENOUS

## 2021-07-30 MED ORDER — LIOTHYRONINE SODIUM 5 MCG PO TABS
5.0000 ug | ORAL_TABLET | Freq: Every morning | ORAL | Status: DC
  Administered 2021-07-31 – 2021-08-06 (×7): 5 ug via ORAL
  Filled 2021-07-30 (×7): qty 1

## 2021-07-30 MED ORDER — LACTATED RINGERS IV SOLN: INTRAVENOUS | Status: AC

## 2021-07-30 MED ORDER — LIP MEDEX EX OINT
TOPICAL_OINTMENT | Freq: Once | CUTANEOUS | Status: AC
Start: 2021-07-30 — End: 2021-07-30
  Filled 2021-07-30: qty 7

## 2021-07-30 MED ORDER — PHYTONADIONE 5 MG PO TABS
2.5000 mg | ORAL_TABLET | Freq: Once | ORAL | Status: AC
  Administered 2021-07-30: 2.5 mg via ORAL
  Filled 2021-07-30: qty 1

## 2021-07-30 MED ORDER — CHLORHEXIDINE GLUCONATE CLOTH 2 % EX PADS
6.0000 | MEDICATED_PAD | Freq: Every day | CUTANEOUS | Status: DC
  Administered 2021-07-30 – 2021-08-06 (×9): 6 via TOPICAL

## 2021-07-30 MED ORDER — MIRTAZAPINE 15 MG PO TABS
15.0000 mg | ORAL_TABLET | Freq: Every day | ORAL | Status: DC
  Administered 2021-07-30 – 2021-08-05 (×7): 15 mg via ORAL
  Filled 2021-07-30 (×7): qty 1

## 2021-07-30 MED ORDER — PANTOPRAZOLE SODIUM 40 MG PO TBEC
40.0000 mg | DELAYED_RELEASE_TABLET | Freq: Every morning | ORAL | Status: DC
  Administered 2021-07-31 – 2021-08-06 (×7): 40 mg via ORAL
  Filled 2021-07-30 (×7): qty 1

## 2021-07-30 MED ORDER — LEVOTHYROXINE SODIUM 50 MCG PO TABS
50.0000 ug | ORAL_TABLET | Freq: Every morning | ORAL | Status: DC
  Administered 2021-07-31 – 2021-08-06 (×7): 50 ug via ORAL
  Filled 2021-07-30 (×7): qty 1

## 2021-07-30 MED ORDER — FOOD THICKENER (SIMPLYTHICK)
1.0000 | ORAL | Status: DC | PRN
  Filled 2021-07-30: qty 1

## 2021-07-30 MED ORDER — ATORVASTATIN CALCIUM 10 MG PO TABS
10.0000 mg | ORAL_TABLET | Freq: Every day | ORAL | Status: DC
  Administered 2021-07-30 – 2021-08-05 (×7): 10 mg via ORAL
  Filled 2021-07-30 (×7): qty 1

## 2021-07-30 NOTE — Plan of Care (Signed)

## 2021-07-30 NOTE — ED Notes (Signed)
Thicker other drinks, water is fine.

## 2021-07-30 NOTE — Progress Notes (Addendum)
07/30/21 1200  SLP Visit Information  SLP Received On 07/30/21  General Information  Behavior/Cognition Alert;Cooperative;Pleasant mood  Patient Positioning Upright in bed  Oral care provided N/A  HPI pt isa 88 yo male adm to [facility] with penile discomfort - found to have low BP and CXR indicating pna. 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 Pt recently moved to [place] and stays at [place].  Swallow eval ordered. He denies having prior swallow evaluation.  Treatment Provided  Treatment provided Dysphagia  Dysphagia Treatment  Temperature Spikes Noted No  Oral Cavity - Dentition Missing dentition;Other (Comment) (obturator in place)  Respiratory Status Room air  Feeding Able to feed self  Patient observed directly with PO's Yes  Liquids provided via Cup  Type of cueing Visual;Tactile;Verbal  Amount of cueing Maximal  Treatment Methods Skilled observation;Upgraded PO texture trial;Compensation strategy training;Patient/caregiver education;Differential diagnosis   Skilled intervention warranted given pt currently declining to undergo MBS (willing to consider next week - ?Monday).  SLP faciliated airway protection, swallow proficiency by using various compensation strategies, diet modifications and education.  Use of nectar thickened liquids via "Nosey cup" clinically was tolerated better than thin via bottle with nipple tip cut.  Pt educated to cease extending neck upward due to opening airway and increasing his aspiration risk.  Use of applesauce helpful to aid in oral clearance of increased viscocity of solids.    Pt demonstrates decreased awareness to dysphagia - denying amount of coughing with liquid intake  = whilst SLP observed coughing with thin more than nectar.  To maximize comfort/airway protection, recommend consume nectar thick liquids except thin water, follow compensation strategies and consider participation in [facility] on  Monday 08/01/21.    Pt able to appropriately answer all questions and participate in session - thus SLP respects his decision to hold on MBS until his pain is controlled.  Advised him to importance of cleaning obturator after intake, keeping cough and expectoration strong.  Left him with all paper work that SLP had written on for communication with pt.   Will follow up Monday with this most pleasant pt. Suspect some level of chronic dysphagia that may be acutely exacerbted given his medical illness.  Thanks for allowing me to help with this pt's care plan.  Oral Phase Signs & Symptoms Anterior loss/spillage;Prolonged bolus formation;Right pocketing  Pharyngeal Phase Signs & Symptoms Delayed cough  Type of PO's observed Nectar-thick liquids;Thin liquids;Dysphagia 1 (puree)  Pain Assessment  Pain Assessment Faces  Faces Pain Scale 4  Pain Location penis  Pain Descriptors / Indicators Grimacing  Pain Intervention(s) Limited activity within patient's tolerance;Monitored during session;Patient requesting pain meds-RN notified  SLP - End of Session  Patient left in bed;with call bell/phone within reach;with bed alarm set  Nurse Communication Aspiration precautions reviewed;Diet recommendation;Swallow strategies reviewed (with NT)  Assessment / Recommendations / Plan  Plan Continue with current plan of care;MBS  Dysphagia Recommendations  Diet recommendations Dysphagia 1 (puree);Thin liquid;Nectar-thick liquid  Liquids provided via Cup  Medication Administration Whole meds with liquid  Compensations Slow rate;Small sips/bites  Postural Changes and/or Swallow Maneuvers Seated upright 90 degrees;Upright 30-60 min after meal  General Recommendations  Oral Care Recommendations Oral care before and after PO  Follow Up Recommendations Other (comment)  Assistance recommended at discharge None  SLP Visit Diagnosis Dysphagia, oral phase (R13.11);Dysphagia, unspecified (R13.10)  Progression Toward Goals   Patient/Family Stated Goal to get pain controlled  SLP  Time Calculation  SLP Start Time (ACUTE ONLY) 1115  SLP Stop Time (ACUTE ONLY) 1144  SLP Time Calculation (min) (ACUTE ONLY) 29 min  SLP Evaluations  $ SLP Speech Visit 1 Visit  SLP Evaluations  $Swallowing Treatment 1 Procedure   Kathleen Lime, [place] SLP Acute Rehab Services Office [phone] Cell [phone]

## 2021-07-30 NOTE — ED Notes (Signed)
Keep as Disphagia 1. Keep on puree diet and order thickner, except for water. Keep sitting upright. Do not use bottle. Use cup that has been cut out for him. Per Jonelle Sidle, Speech Therapist. Clean his obturator after eats and make sure he had a toothbrush.Pt refused swallow test. Keep head in neutral position.

## 2021-07-30 NOTE — ED Notes (Addendum)
Critical Lab:  INR 5.0

## 2021-07-30 NOTE — Consult Note (Signed)
Urology Consult   Physician requesting consult: Dr. Erlinda Hong  Reason for consult: Penile pain, possible genital infection  History of Present Illness: Craig Snow is a 88 y.o. who is extremely hard of hearing who has a history of bladder cancer and chronic urinary retention.  I obtained the history from the patient and his son but do not have any of his prior urologic records. He recently relocated from [place], [place] to an assisted living facility in [place] to be closer to his son. He is actually scheduled to be seen by Dr. Claudia Desanctis in our office next week to establish urologic care and for presumed bladder cancer surveillance/management.  He was reportedly diagnosed with what sounds like high risk, superficial bladder cancer and is s/p TURBT last year.  He apparently has required a chronic urethral catheter since then for unclear reasons.  He had been receiving intravesical adjuvant BCG throughout the past year.  He developed worsening penile pain over the past couple of weeks and his catheter was changed out on 07/19/21.  He continued having penile pain and was started on empiric antibiotic therapy which did not improve his symptoms.  He developed worsening pain and was evaluated at the ED today and was also noted to hypotensive and tachycardic.  CXR indicated possible pneumonia. He currently denies penile pain.  There was concern of a genital infection on exam at admission due to erythema of the scrotum and groin.  The primary team had concern about possible Fournier's gangrene prompting a urologic consultation.  He denies fever.   Past Medical History:  Diagnosis Date   Atrial fibrillation ([facility])    Hyperlipidemia    Hypertension    Thyrotoxicosis     History reviewed. No pertinent surgical history.  Medications:  Home meds:  No current facility-administered medications on file prior to encounter.   Current Outpatient Medications on File Prior to Encounter  Medication Sig Dispense Refill    acetaminophen (TYLENOL) 500 MG tablet Take 1,000 mg by mouth every 6 (six) hours as needed (pain).     atorvastatin (LIPITOR) 10 MG tablet Take 10 mg by mouth at bedtime.     Docusate Sodium (COLACE PO) Take 2 capsules by mouth daily as needed (constipation).     furosemide (LASIX) 20 MG tablet Take 20 mg by mouth every morning.     levothyroxine (SYNTHROID) 50 MCG tablet Take 50 mcg by mouth every morning.     liothyronine (CYTOMEL) 5 MCG tablet Take 5 mcg by mouth every morning.     Melatonin 10 MG TABS Take 10 mg by mouth at bedtime as needed (sleep).     metoprolol tartrate (LOPRESSOR) 50 MG tablet Take 50 mg by mouth daily at 12 noon.     mirtazapine (REMERON) 15 MG tablet Take 15 mg by mouth at bedtime.     pantoprazole (PROTONIX) 40 MG tablet Take 40 mg by mouth every morning.     phenazopyridine (PYRIDIUM) 200 MG tablet Take 200 mg by mouth every 8 (eight) hours as needed for pain.     polyethylene glycol (MIRALAX / GLYCOLAX) 17 g packet Take 17 g by mouth every morning.     potassium chloride (KLOR-CON M) 10 MEQ tablet Take 10 mEq by mouth every morning.     tamsulosin (FLOMAX) 0.4 MG CAPS capsule Take 0.4 mg by mouth every morning.     traMADol (ULTRAM) 50 MG tablet Take 50 mg by mouth every 8 (eight) hours as needed for moderate pain or severe  pain.     warfarin (COUMADIN) 2 MG tablet Take 2 mg by mouth See admin instructions. Take one tablet (2 mg) by mouth in the morning on Sunday and Tuesday morning (take 3 mg on Monday, Wednesday, Thursday, Friday)     warfarin (COUMADIN) 3 MG tablet Take 3 mg by mouth See admin instructions. Take one tablet (3 mg) by mouth on Monday, Wednesday, Thursday, Friday morning (take 2 mg on Sunday and Tuesday)     nitrofurantoin (MACRODANTIN) 100 MG capsule Take 100 mg by mouth every 12 (twelve) hours. (Patient not taking: Reported on 07/30/2021)       Scheduled Meds:  clotrimazole   Topical BID   Continuous Infusions:  azithromycin     ceFEPime  (MAXIPIME) IV Stopped (07/30/21 0900)   PRN Meds:.acetaminophen, food thickener, HYDROcodone-acetaminophen  Allergies:  Allergies  Allergen Reactions   Aspirin Other (See Comments)    Unknown reaction - listed on [facility] 07/30/21   Clindamycin/Lincomycin Other (See Comments)    Unknown reaction - listed on [facility] 07/30/21   Other Other (See Comments)    Unknown reaction to opioids - listed on [facility] 07/30/21   Sulfa Antibiotics Other (See Comments)    Unknown reaction - listed on [facility] 07/30/21    History reviewed. No pertinent family history.  Social History:  reports that he quit smoking about 50 years ago. His smoking use included cigarettes. He has never used smokeless tobacco. He reports that he does not currently use alcohol. He reports that he does not currently use drugs.  ROS: A complete review of systems was performed.  All systems are negative except for pertinent findings as noted.  Physical Exam:  Vital signs in last 24 hours: Temp:  [98.4 F (36.9 C)-99.8 F (37.7 C)] 98.4 F (36.9 C) (01/27 1609) Pulse Rate:  [32-141] 107 (01/27 1609) Resp:  [9-45] 20 (01/27 1609) BP: (79-151)/(42-88) 151/74 (01/27 1609) SpO2:  [78 %-100 %] 96 % (01/27 1609) Weight:  [65.8 kg] 65.8 kg (01/26 1856) Constitutional:  Alert and oriented, No acute distress Cardiovascular: Mild tachycardia, No JVD Respiratory: Normal respiratory effort GI: Abdomen is soft, nontender, nondistended, no abdominal masses Genitourinary: No CVAT. Normal male phallus with indwelling Foley catheter with grossly clear urine in bag.  Penis is non-tender and without edema.  Very mild erythema of the right inguinal region and upper scrotum.  No fluctuance, crepitus, or drainage. Lymphatic: No lymphadenopathy Neurologic: Grossly intact, no focal deficits Psychiatric: Normal mood and affect  Laboratory Data:  Recent Labs    07/29/21 2000 07/30/21 0650  WBC 14.7* 10.0  HGB 13.4 12.5*  HCT 40.2 37.3*  PLT 204 192     Recent Labs    07/29/21 2000  NA 137  K 3.9  CL 100  GLUCOSE 152*  BUN 41*  CALCIUM 11.0*  CREATININE 1.11     Results for orders placed or performed during the hospital encounter of 07/29/21 (from the past 24 hour(s))  Culture, blood (Routine x 2)     Status: None (Preliminary result)   Collection Time: 07/29/21  7:55 PM   Specimen: BLOOD  Result Value Ref Range   Specimen Description      BLOOD BLOOD LEFT FOREARM Performed at [facility] [address] [place], [place] [address]    Special Requests      BOTTLES DRAWN AEROBIC AND ANAEROBIC Blood Culture results may not be optimal due to an excessive volume of blood received in culture bottles Performed at  [facility], [place] [address], [place], [place] [address]    Culture      NO GROWTH < 12 HOURS Performed at [facility] [address] [place], [place] [address]    Report Status PENDING   Comprehensive metabolic panel     Status: Abnormal   Collection Time: 07/29/21  8:00 PM  Result Value Ref Range   Sodium 137 135 - 145 mmol/L   Potassium 3.9 3.5 - 5.1 mmol/L   Chloride 100 98 - 111 mmol/L   CO2 26 22 - 32 mmol/L   Glucose, Bld 152 (H) 70 - 99 mg/dL   BUN 41 (H) 8 - 23 mg/dL   Creatinine, Ser 1.11 0.61 - 1.24 mg/dL   Calcium 11.0 (H) 8.9 - 10.3 mg/dL   Total Protein 7.6 6.5 - 8.1 g/dL   Albumin 3.9 3.5 - 5.0 g/dL   AST 31 15 - 41 U/L   ALT 20 0 - 44 U/L   Alkaline Phosphatase 79 38 - 126 U/L   Total Bilirubin 1.2 0.3 - 1.2 mg/dL   GFR, Estimated >60 >60 mL/min   Anion gap 11 5 - 15  Lactic acid, plasma     Status: None   Collection Time: 07/29/21  8:00 PM  Result Value Ref Range   Lactic Acid, Venous 1.8 0.5 - 1.9 mmol/L  CBC with Differential     Status: Abnormal   Collection Time: 07/29/21  8:00 PM  Result Value Ref Range   WBC 14.7 (H) 4.0 - 10.5 K/uL   RBC 3.69 (L) 4.22 - 5.81 MIL/uL   Hemoglobin 13.4 13.0 - 17.0 g/dL   HCT 40.2 39.0 - 52.0 %    MCV 108.9 (H) 80.0 - 100.0 fL   MCH 36.3 (H) 26.0 - 34.0 pg   MCHC 33.3 30.0 - 36.0 g/dL   RDW 13.2 11.5 - 15.5 %   Platelets 204 150 - 400 K/uL   nRBC 0.0 0.0 - 0.2 %   Neutrophils Relative % 80 %   Neutro Abs 11.8 (H) 1.7 - 7.7 K/uL   Lymphocytes Relative 11 %   Lymphs Abs 1.5 0.7 - 4.0 K/uL   Monocytes Relative 9 %   Monocytes Absolute 1.3 (H) 0.1 - 1.0 K/uL   Eosinophils Relative 0 %   Eosinophils Absolute 0.0 0.0 - 0.5 K/uL   Basophils Relative 0 %   Basophils Absolute 0.0 0.0 - 0.1 K/uL   Immature Granulocytes 0 %   Abs Immature Granulocytes 0.06 0.00 - 0.07 K/uL  Protime-INR     Status: Abnormal   Collection Time: 07/29/21  8:00 PM  Result Value Ref Range   Prothrombin Time 47.2 (H) 11.4 - 15.2 seconds   INR 5.1 (HH) 0.8 - 1.2  Culture, blood (Routine x 2)     Status: None (Preliminary result)   Collection Time: 07/29/21  8:00 PM   Specimen: BLOOD  Result Value Ref Range   Specimen Description      BLOOD LEFT ANTECUBITAL Performed at [facility], [place] [address], [place], [place] [address]    Special Requests      BOTTLES DRAWN AEROBIC AND ANAEROBIC Blood Culture adequate volume Performed at [facility] [address], [place], [place] [address]    Culture      NO GROWTH < 12 HOURS Performed at [facility] [address], [place], [place] [address]    Report Status PENDING  TSH     Status: None   Collection Time: 07/29/21  8:00 PM  Result Value Ref Range   TSH 1.439 0.350 - 4.500 uIU/mL  Urinalysis, Routine w reflex microscopic Urine, Catheterized     Status: Abnormal   Collection Time: 07/29/21  8:14 PM  Result Value Ref Range   Color, Urine YELLOW YELLOW   APPearance CLEAR CLEAR   Specific Gravity, Urine 1.010 1.005 - 1.030   pH 6.5 5.0 - 8.0   Glucose, UA 100 (A) NEGATIVE mg/dL   Hgb urine dipstick SMALL (A) NEGATIVE   Bilirubin Urine SMALL (A) NEGATIVE   Ketones, ur NEGATIVE NEGATIVE mg/dL   Protein, ur  30 (A) NEGATIVE mg/dL   Nitrite POSITIVE (A) NEGATIVE   Leukocytes,Ua LARGE (A) NEGATIVE  Urinalysis, Microscopic (reflex)     Status: Abnormal   Collection Time: 07/29/21  8:14 PM  Result Value Ref Range   RBC / HPF 0-5 0 - 5 RBC/hpf   WBC, UA 11-20 0 - 5 WBC/hpf   Bacteria, UA FEW (A) NONE SEEN   Squamous Epithelial / LPF 0-5 0 - 5  Resp Panel by RT-PCR (Flu A&B, Covid) Urine, Clean Catch     Status: None   Collection Time: 07/29/21  9:15 PM   Specimen: Urine, Clean Catch; Nasopharyngeal(NP) swabs in vial transport medium  Result Value Ref Range   SARS Coronavirus 2 by RT PCR NEGATIVE NEGATIVE   Influenza A by PCR NEGATIVE NEGATIVE   Influenza B by PCR NEGATIVE NEGATIVE  CBC     Status: Abnormal   Collection Time: 07/30/21  6:50 AM  Result Value Ref Range   WBC 10.0 4.0 - 10.5 K/uL   RBC 3.39 (L) 4.22 - 5.81 MIL/uL   Hemoglobin 12.5 (L) 13.0 - 17.0 g/dL   HCT 37.3 (L) 39.0 - 52.0 %   MCV 110.0 (H) 80.0 - 100.0 fL   MCH 36.9 (H) 26.0 - 34.0 pg   MCHC 33.5 30.0 - 36.0 g/dL   RDW 13.1 11.5 - 15.5 %   Platelets 192 150 - 400 K/uL   nRBC 0.0 0.0 - 0.2 %  Protime-INR     Status: Abnormal   Collection Time: 07/30/21  6:50 AM  Result Value Ref Range   Prothrombin Time 46.5 (H) 11.4 - 15.2 seconds   INR 5.0 (HH) 0.8 - 1.2   Recent Results (from the past 240 hour(s))  Culture, blood (Routine x 2)     Status: None (Preliminary result)   Collection Time: 07/29/21  7:55 PM   Specimen: BLOOD  Result Value Ref Range Status   Specimen Description   Final    BLOOD BLOOD LEFT FOREARM Performed at [facility], [address], [place], [place] [address]    Special Requests   Final    BOTTLES DRAWN AEROBIC AND ANAEROBIC Blood Culture results may not be optimal due to an excessive volume of blood received in culture bottles Performed at [facility] [address] [place], [place] [address]    Culture   Final    NO GROWTH < 12 HOURS Performed at  [facility] [address], [place], [place] [address]    Report Status PENDING  Incomplete  Culture, blood (Routine x 2)     Status: None (Preliminary result)   Collection Time: 07/29/21  8:00 PM   Specimen: BLOOD  Result Value Ref Range Status   Specimen Description   Final  BLOOD LEFT ANTECUBITAL Performed at [facility] [address] [place], [place] [address]    Special Requests   Final    BOTTLES DRAWN AEROBIC AND ANAEROBIC Blood Culture adequate volume Performed at [facility] [address] [place], [place] [address]    Culture   Final    NO GROWTH < 12 HOURS Performed at [facility] [address], [place], [place] [address]    Report Status PENDING  Incomplete  Resp Panel by RT-PCR (Flu A&B, Covid) Urine, Clean Catch     Status: None   Collection Time: 07/29/21  9:15 PM   Specimen: Urine, Clean Catch; Nasopharyngeal(NP) swabs in vial transport medium  Result Value Ref Range Status   SARS Coronavirus 2 by RT PCR NEGATIVE NEGATIVE Final    Comment: (NOTE) SARS-CoV-2 target nucleic acids are NOT DETECTED.  The SARS-CoV-2 RNA is generally detectable in upper respiratory specimens during the acute phase of infection. The lowest concentration of SARS-CoV-2 viral copies this assay can detect is 138 copies/mL. A negative result does not preclude SARS-Cov-2 infection and should not be used as the sole basis for treatment or other patient management decisions. A negative result may occur with  improper specimen collection/handling, submission of specimen other than nasopharyngeal swab, presence of viral mutation(s) within the areas targeted by this assay, and inadequate number of viral copies(<138 copies/mL). A negative result must be combined with clinical observations, patient history, and epidemiological information. The expected result is Negative.  Fact Sheet for Patients:   EntrepreneurPulse.com.au  Fact Sheet for Healthcare Providers:  IncredibleEmployment.be  This test is no t yet approved or cleared by the [place] FDA and  has been authorized for detection and/or diagnosis of SARS-CoV-2 by FDA under an Emergency Use Authorization (EUA). This EUA will remain  in effect (meaning this test can be used) for the duration of the COVID-19 declaration under Section 564(b)(1) of the Act, 21 U.S.C.section 360bbb-3(b)(1), unless the authorization is terminated  or revoked sooner.       Influenza A by PCR NEGATIVE NEGATIVE Final   Influenza B by PCR NEGATIVE NEGATIVE Final    Comment: (NOTE) The Xpert Xpress SARS-CoV-2/FLU/RSV plus assay is intended as an aid in the diagnosis of influenza from Nasopharyngeal swab specimens and should not be used as a sole basis for treatment. Nasal washings and aspirates are unacceptable for Xpert Xpress SARS-CoV-2/FLU/RSV testing.  Fact Sheet for Patients: EntrepreneurPulse.com.au  Fact Sheet for Healthcare Providers: IncredibleEmployment.be  This test is not yet approved or cleared by the [place] FDA and has been authorized for detection and/or diagnosis of SARS-CoV-2 by FDA under an Emergency Use Authorization (EUA). This EUA will remain in effect (meaning this test can be used) for the duration of the COVID-19 declaration under Section 564(b)(1) of the Act, 21 U.S.C. section 360bbb-3(b)(1), unless the authorization is terminated or revoked.  Performed at [facility], [place] [address] [place], [place] [address]     Renal Function: Recent Labs    07/29/21 2000  CREATININE 1.11   Estimated Creatinine Clearance: 34.6 mL/min (by C-G formula based on SCr of 1.11 mg/dL).  Radiologic Imaging: DG Chest 2 View  Result Date: 07/29/2021 CLINICAL DATA:  Suspected Sepsis Lethargy.  Fever. EXAM: CHEST - 2 VIEW  COMPARISON:  None. FINDINGS: The heart is normal in size. Normal mediastinal contours. Aortic atherosclerosis. Patchy left greater than right basilar opacities with increased retrocardiac density on the lateral view. No  pulmonary edema, pneumothorax, or significant pleural effusion. No acute osseous abnormalities are seen IMPRESSION: Patchy left greater than right basilar opacities concerning for pneumonia in the setting of fever. Electronically Signed   By: Keith Rake M.D.   On: 07/29/2021 19:35   CT ABDOMEN PELVIS W CONTRAST  Result Date: 07/30/2021 CLINICAL DATA:  Complicated urinary tract infection. Clinical concern for Fournier's gangrene. Chronic low pelvic pain. History of bladder cancer. EXAM: CT ABDOMEN AND PELVIS WITH CONTRAST TECHNIQUE: Multidetector CT imaging of the abdomen and pelvis was performed using the standard protocol following bolus administration of intravenous contrast. RADIATION DOSE REDUCTION: This exam was performed according to the departmental dose-optimization program which includes automated exposure control, adjustment of the mA and/or kV according to patient size and/or use of iterative reconstruction technique. CONTRAST:  163mL OMNIPAQUE IOHEXOL 300 MG/ML  SOLN COMPARISON:  Chest radiographs 07/29/2021. FINDINGS: Lower chest: Images through the lung bases are degraded by breathing artifact. There are patchy dependent ground-glass opacities at both lung bases which remain suspicious for infection. There is a moderate size hiatal hernia. Atherosclerosis of the aorta and coronary arteries noted. No significant pleural or pericardial effusion. Hepatobiliary: The liver is normal in density without suspicious focal abnormality. No evidence of gallstones, gallbladder wall thickening or biliary dilatation. Pancreas: Diffusely atrophied. No evidence of focal mass lesion, ductal dilatation or surrounding inflammation. Spleen: Normal in size without focal abnormality.  Adrenals/Urinary Tract: Both adrenal glands appear normal. Mild renal cortical thinning bilaterally. No evidence of hydronephrosis, renal or ureteral calculus. A Foley catheter is in place with non dependent air in the bladder. There is irregular bladder wall thickening without surrounding inflammation. Dependently in the right side of the bladder, there is a 7 mm calcification on image 76/2, likely a bladder calculus. Stomach/Bowel: No enteric contrast administered. As above, there is a moderate size hiatal hernia. The stomach otherwise appears unremarkable for its degree of distention. No evidence of bowel wall thickening, significant distention or surrounding inflammation. There is a diverticulum of the 2nd portion of the duodenum. The appendix appears normal. There are mild diverticular changes and mildly prominent stool throughout the colon. A descending colonic anastomosis is noted. Vascular/Lymphatic: There are no enlarged abdominal or pelvic lymph nodes. Diffuse aortic and branch vessel atherosclerosis without evidence of aneurysm or large vessel occlusion. Reproductive: The lower pelvis is partially obscured by beam hardening artifact from the right total hip arthroplasty. The prostate gland is moderately enlarged. No definite inflammatory changes or soft tissue emphysema identified within the perineum. There may be a very small amount of gas along the posterior aspect of the penis which could be located between the penis and scrotum, especially if the patient is not circumcised. Other: No evidence of abdominal wall mass or hernia. No ascites. Musculoskeletal: No acute or significant osseous findings. There is multilevel lumbar spondylosis associated with a convex right scoliosis. Patient is status post right total hip arthroplasty. No inflammatory changes, fluid collections or soft tissue emphysema identified in the visualized proximal right thigh. IMPRESSION: 1. No definite inflammatory changes identified  within the anterior perineum or visualized proximal right thigh. Assessment is mildly limited by artifact from the right total hip arthroplasty. A small amount of gas along the posterior aspect of the penis is not definitely pathologic, but requires clinical correlation. 2. Bladder wall thickening which may correspond with reported urinary tract infection and/or chronic bladder outlet obstruction. Small right-sided bladder calculus. Foley catheter in place. 3. No hydronephrosis or delayed contrast excretion. Mild renal  cortical thinning bilaterally. 4. Patchy ground-glass opacities at both lung bases as seen on chest radiographs yesterday, consistent with pneumonia. Radiographic follow up recommended. 5. Additional incidental findings including a moderate size hiatal hernia, diffuse diverticular changes throughout the colon, moderate enlargement of the prostate gland and Aortic Atherosclerosis (ICD10-I70.0). Electronically Signed   By: Richardean Sale M.D.   On: 07/30/2021 12:23   DG HIP PORT UNILAT WITH PELVIS 1V RIGHT  Result Date: 07/29/2021 CLINICAL DATA:  Right-sided groin pain. EXAM: DG HIP (WITH OR WITHOUT PELVIS) 1V PORT RIGHT COMPARISON:  None. 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 The arthroplasty components appear intact and aligned. No acute fracture or dislocation the bones are osteopenic. Vascular calcifications. Degenerative changes of the lower lumbar spine. IMPRESSION: No acute fracture or dislocation. Electronically Signed   By: Anner Crete M.D.   On: 07/29/2021 22:36    I independently reviewed the above imaging studies.  Impression/Recommendation 1) Genital erythema: Physical exam and imaging do NOT suggest Fournier's gangrene.  Likely fungal infection and would be appropriate to treat with topical antifungal therapy. 2) Urinary retention: Unclear as to etiology. Maintain chronic Foley for now.  This was just changed on 07/19/21 and does not require exchange now although ok  to change if patient requests.  3) Bladder cancer: Follow up as scheduled with Dr. Claudia Desanctis next week since this is already scheduled to establish ongoing urologic care/surveillance for bladder cancer.  Dutch Gray 07/30/2021, 5:46 PM    Pryor Curia MD  CC: Dr. Erlinda Hong

## 2021-07-30 NOTE — ED Notes (Signed)
Dr. Royal Piedra informed pts BP is 87/56 via epic secure chat

## 2021-07-30 NOTE — Progress Notes (Addendum)
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, does not appear to be in acute distress  Assessment & Plan:   Principal Problem:   Atrial fibrillation with RVR ([place]) Active Problems:   Acute lower UTI   Community acquired pneumonia   Supratherapeutic INR   Candidiasis of penis   Stage 1 skin ulcer of sacral region [facility])   Bladder cancer ([place])  Complicated UTI/can not rule Fournier  --Recently diagnosed bladder cancer getting BCG therapy, Foley last changed on 1/16 -Received Macrobid on 1/20 symptoms did not improve -Erythema over shaft of penis into perineum into right groin folds -Urine culture in process, blood culture no growth -Currently on cefepime/Zithromax -urology consulted, will follow recommendation  Bilateral pneumonia?  Aspiration pneumonia? -Cxr: Patchy left greater than right basilar opacities concerning for pneumonia in the setting of fever -History of palate cancer status postresection XRT, Has been is on soft diet -Currently on room air, no respiratory distress -Continue antibiotics -Continue aspiration precaution, soft diet, speech  will see  Afib Presented with A. fib RVR,Currently rate controlled, start Lopressor with holding parameters Present with supratherapeutic INR.  INR 5.1 on presentation Hold Coumadin, 1 dose vitamin K given on 1/27 Repeat INR in the morning  FTT: will get PT eval Stage I sacral ulcer, pressure offloading measures    The patients BMI is: Body mass index is 20.22 kg/m.Marland Kitchen   Unresulted Labs  (From admission, onward)     Start     Ordered   07/31/21 6237  Basic metabolic panel  Tomorrow morning,   R        07/30/21 0937   07/31/21 0500  Magnesium  Tomorrow morning,   R        07/30/21 0937   07/31/21 0500  Procalcitonin  Daily,   R      07/30/21 0938   07/31/21 0500  CBC with Differential/Platelet  Tomorrow morning,   R        07/30/21 0948   07/30/21 0500  Protime-INR  Daily,   R      07/29/21 2156   07/29/21 2042  Urine Culture  Once,   STAT       Question:  Indication  Answer:  Sepsis   07/29/21 2042              DVT prophylaxis: SCDs Start: 07/29/21 2153   Code Status: DNR Family Communication: Son over the phone Disposition:   Status is: Inpatient  Dispo: The patient is from: ALF              Anticipated d/c is to: pending PT EVal              Anticipated d/c date is: TBD, monitor INR, follow up on urine culture                Consultants:  Urology   Antimicrobials:   Anti-infectives (From admission, onward)    Start     Dose/Rate Route Frequency Ordered Stop  07/30/21 2200  azithromycin (ZITHROMAX) 500 mg in sodium chloride 0.9 % 250 mL IVPB        500 mg 250 mL/hr over 60 Minutes Intravenous Every 24 hours 07/29/21 2156     07/30/21 0400  ceFEPIme (MAXIPIME) 2 g in sodium chloride 0.9 % 100 mL IVPB        2 g 200 mL/hr over 30 Minutes Intravenous Every 24 hours 07/29/21 2313     07/29/21 2015  cefTRIAXone (ROCEPHIN) 1 g in sodium chloride 0.9 % 100 mL IVPB        1 g 200 mL/hr over 30 Minutes Intravenous  Once 07/29/21 2000 07/29/21 2039   07/29/21 2015  azithromycin (ZITHROMAX) 500 mg in sodium chloride 0.9 % 250 mL IVPB        500 mg 250 mL/hr over 60 Minutes Intravenous  Once 07/29/21 2000 07/29/21 2114           Objective: Vitals:   07/30/21 0900 07/30/21 1320 07/30/21 1609 07/30/21 2047  BP: 109/67 (!) 130/59 (!) 151/74 120/74  Pulse: (!) 58 77 (!) 107 86  Resp: 20 18 20 18   Temp:   98.4 F (36.9 C) 98.7 F (37.1 C)   TempSrc:   Oral Oral  SpO2: 93% 97% 96% 98%  Weight:      Height:        Intake/Output Summary (Last 24 hours) at 07/30/2021 2251 Last data filed at 07/30/2021 1613 Gross per 24 hour  Intake 2100.38 ml  Output --  Net 2100.38 ml   Filed Weights   07/29/21 1856  Weight: 65.8 kg    Examination:  General exam: alert, awake, very hard of hearing, Respiratory system: Clear to auscultation. Respiratory effort normal. Cardiovascular system:  RRR.  Gastrointestinal system: Abdomen is nondistended, soft and nontender.  Normal bowel sounds heard. Central nervous system: Alert and communicative, very hard of hearing Extremities:  no edema Skin: No rashes, lesions or ulcers Psychiatry: Appears anxious, no agitation    Data Reviewed: I have personally reviewed following labs and imaging studies  CBC: Recent Labs  Lab 07/29/21 2000 07/30/21 0650  WBC 14.7* 10.0  NEUTROABS 11.8*  --   HGB 13.4 12.5*  HCT 40.2 37.3*  MCV 108.9* 110.0*  PLT 204 170    Basic Metabolic Panel: Recent Labs  Lab 07/29/21 2000  NA 137  K 3.9  CL 100  CO2 26  GLUCOSE 152*  BUN 41*  CREATININE 1.11  CALCIUM 11.0*    GFR: Estimated Creatinine Clearance: 34.6 mL/min (by C-G formula based on SCr of 1.11 mg/dL).  Liver Function Tests: Recent Labs  Lab 07/29/21 2000  AST 31  ALT 20  ALKPHOS 79  BILITOT 1.2  PROT 7.6  ALBUMIN 3.9    CBG: No results for input(s): GLUCAP in the last 168 hours.   Recent Results (from the past 240 hour(s))  Culture, blood (Routine x 2)     Status: None (Preliminary result)   Collection Time: 07/29/21  7:55 PM   Specimen: BLOOD  Result Value Ref Range Status   Specimen Description   Final    BLOOD BLOOD LEFT FOREARM Performed at [facility] [address], [place], [place] [address]    Special Requests   Final    BOTTLES DRAWN AEROBIC AND ANAEROBIC Blood Culture results may not be optimal due to an excessive volume of blood  received in culture bottles Performed at [facility] [address]  [place], [place] [address]    Culture   Final    NO GROWTH < 12 HOURS Performed at [facility] [address] [place], [place] [address]    Report Status PENDING  Incomplete  Culture, blood (Routine x 2)     Status: None (Preliminary result)   Collection Time: 07/29/21  8:00 PM   Specimen: BLOOD  Result Value Ref Range Status   Specimen Description   Final    BLOOD LEFT ANTECUBITAL Performed at [facility] [address], [place], [place] [address]    Special Requests   Final    BOTTLES DRAWN AEROBIC AND ANAEROBIC Blood Culture adequate volume Performed at [facility] [address], [place], [place] [address]    Culture   Final    NO GROWTH < 12 HOURS Performed at [facility] [address] [place], [place] [address]    Report Status PENDING  Incomplete  Resp Panel by RT-PCR (Flu A&B, Covid) Urine, Clean Catch     Status: None   Collection Time: 07/29/21  9:15 PM   Specimen: Urine, Clean Catch; Nasopharyngeal(NP) swabs in vial transport medium  Result Value Ref Range Status   SARS Coronavirus 2 by RT PCR NEGATIVE NEGATIVE Final    Comment: (NOTE) SARS-CoV-2 target nucleic acids are NOT DETECTED.  The SARS-CoV-2 RNA is generally detectable in upper respiratory specimens during the acute phase of infection. The lowest concentration of SARS-CoV-2 viral copies this assay can detect is 138 copies/mL. A negative result does not preclude SARS-Cov-2 infection and should not be used as the sole basis for treatment or other patient management decisions. A negative result may occur with  improper specimen collection/handling, submission of specimen other than nasopharyngeal swab, presence of viral mutation(s) within the areas targeted by this assay, and inadequate number of viral copies(<138 copies/mL). A negative result must be combined  with clinical observations, patient history, and epidemiological information. The expected result is Negative.  Fact Sheet for Patients:  EntrepreneurPulse.com.au  Fact Sheet for Healthcare Providers:  IncredibleEmployment.be  This test is no t yet approved or cleared by the [place] FDA and  has been authorized for detection and/or diagnosis of SARS-CoV-2 by FDA under an Emergency Use Authorization (EUA). This EUA will remain  in effect (meaning this test can be used) for the duration of the COVID-19 declaration under Section 564(b)(1) of the Act, 21 U.S.C.section 360bbb-3(b)(1), unless the authorization is terminated  or revoked sooner.       Influenza A by PCR NEGATIVE NEGATIVE Final   Influenza B by PCR NEGATIVE NEGATIVE Final    Comment: (NOTE) The Xpert Xpress SARS-CoV-2/FLU/RSV plus assay is intended as an aid in the diagnosis of influenza from Nasopharyngeal swab specimens and should not be used as a sole basis for treatment. Nasal washings and aspirates are unacceptable for Xpert Xpress SARS-CoV-2/FLU/RSV testing.  Fact Sheet for Patients: EntrepreneurPulse.com.au  Fact Sheet for Healthcare Providers: IncredibleEmployment.be  This test is not yet approved or cleared by the [place] FDA and has been authorized for detection and/or diagnosis of SARS-CoV-2 by FDA under an Emergency Use Authorization (EUA). This EUA will remain in effect (meaning this test can be used) for the duration of the COVID-19 declaration under Section 564(b)(1) of the Act, 21 U.S.C. section 360bbb-3(b)(1), unless the authorization is terminated or revoked.  Performed at [facility], [place] [address] [place], [place] [address]  Radiology Studies: DG Chest 2 View  Result Date: 07/29/2021 CLINICAL DATA:  Suspected Sepsis Lethargy.  Fever. EXAM: CHEST - 2 VIEW COMPARISON:  None.  FINDINGS: The heart is normal in size. Normal mediastinal contours. Aortic atherosclerosis. Patchy left greater than right basilar opacities with increased retrocardiac density on the lateral view. No pulmonary edema, pneumothorax, or significant pleural effusion. No acute osseous abnormalities are seen IMPRESSION: Patchy left greater than right basilar opacities concerning for pneumonia in the setting of fever. Electronically Signed   By: Keith Rake M.D.   On: 07/29/2021 19:35   CT ABDOMEN PELVIS W CONTRAST  Result Date: 07/30/2021 CLINICAL DATA:  Complicated urinary tract infection. Clinical concern for Fournier's gangrene. Chronic low pelvic pain. History of bladder cancer. EXAM: CT ABDOMEN AND PELVIS WITH CONTRAST TECHNIQUE: Multidetector CT imaging of the abdomen and pelvis was performed using the standard protocol following bolus administration of intravenous contrast. RADIATION DOSE REDUCTION: This exam was performed according to the departmental dose-optimization program which includes automated exposure control, adjustment of the mA and/or kV according to patient size and/or use of iterative reconstruction technique. CONTRAST:  161mL OMNIPAQUE IOHEXOL 300 MG/ML  SOLN COMPARISON:  Chest radiographs 07/29/2021. FINDINGS: Lower chest: Images through the lung bases are degraded by breathing artifact. There are patchy dependent ground-glass opacities at both lung bases which remain suspicious for infection. There is a moderate size hiatal hernia. Atherosclerosis of the aorta and coronary arteries noted. No significant pleural or pericardial effusion. Hepatobiliary: The liver is normal in density without suspicious focal abnormality. No evidence of gallstones, gallbladder wall thickening or biliary dilatation. Pancreas: Diffusely atrophied. No evidence of focal mass lesion, ductal dilatation or surrounding inflammation. Spleen: Normal in size without focal abnormality. Adrenals/Urinary Tract: Both  adrenal glands appear normal. Mild renal cortical thinning bilaterally. No evidence of hydronephrosis, renal or ureteral calculus. A Foley catheter is in place with non dependent air in the bladder. There is irregular bladder wall thickening without surrounding inflammation. Dependently in the right side of the bladder, there is a 7 mm calcification on image 76/2, likely a bladder calculus. Stomach/Bowel: No enteric contrast administered. As above, there is a moderate size hiatal hernia. The stomach otherwise appears unremarkable for its degree of distention. No evidence of bowel wall thickening, significant distention or surrounding inflammation. There is a diverticulum of the 2nd portion of the duodenum. The appendix appears normal. There are mild diverticular changes and mildly prominent stool throughout the colon. A descending colonic anastomosis is noted. Vascular/Lymphatic: There are no enlarged abdominal or pelvic lymph nodes. Diffuse aortic and branch vessel atherosclerosis without evidence of aneurysm or large vessel occlusion. Reproductive: The lower pelvis is partially obscured by beam hardening artifact from the right total hip arthroplasty. The prostate gland is moderately enlarged. No definite inflammatory changes or soft tissue emphysema identified within the perineum. There may be a very small amount of gas along the posterior aspect of the penis which could be located between the penis and scrotum, especially if the patient is not circumcised. Other: No evidence of abdominal wall mass or hernia. No ascites. Musculoskeletal: No acute or significant osseous findings. There is multilevel lumbar spondylosis associated with a convex right scoliosis. Patient is status post right total hip arthroplasty. No inflammatory changes, fluid collections or soft tissue emphysema identified in the visualized proximal right thigh. IMPRESSION: 1. No definite inflammatory changes identified within the anterior perineum  or visualized proximal right thigh. Assessment is mildly limited by artifact from the right total hip  arthroplasty. A small amount of gas along the posterior aspect of the penis is not definitely pathologic, but requires clinical correlation. 2. Bladder wall thickening which may correspond with reported urinary tract infection and/or chronic bladder outlet obstruction. Small right-sided bladder calculus. Foley catheter in place. 3. No hydronephrosis or delayed contrast excretion. Mild renal cortical thinning bilaterally. 4. Patchy ground-glass opacities at both lung bases as seen on chest radiographs yesterday, consistent with pneumonia. Radiographic follow up recommended. 5. Additional incidental findings including a moderate size hiatal hernia, diffuse diverticular changes throughout the colon, moderate enlargement of the prostate gland and Aortic Atherosclerosis (ICD10-I70.0). Electronically Signed   By: Richardean Sale M.D.   On: 07/30/2021 12:23   DG HIP PORT UNILAT WITH PELVIS 1V RIGHT  Result Date: 07/29/2021 CLINICAL DATA:  Right-sided groin pain. EXAM: DG HIP (WITH OR WITHOUT PELVIS) 1V PORT RIGHT COMPARISON:  None. 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 The arthroplasty components appear intact and aligned. No acute fracture or dislocation the bones are osteopenic. Vascular calcifications. Degenerative changes of the lower lumbar spine. IMPRESSION: No acute fracture or dislocation. Electronically Signed   By: Anner Crete M.D.   On: 07/29/2021 22:36        Scheduled Meds:  atorvastatin  10 mg Oral QHS   Chlorhexidine Gluconate Cloth  6 each Topical Daily   clotrimazole   Topical BID   [START ON 07/31/2021] levothyroxine  50 mcg Oral q morning   [START ON 07/31/2021] liothyronine  5 mcg Oral q morning   metoprolol tartrate  25 mg Oral BID   mirtazapine  15 mg Oral QHS   [START ON 07/31/2021] pantoprazole  40 mg Oral q morning   [START ON 07/31/2021] polyethylene glycol  17 g Oral q  morning   [START ON 07/31/2021] tamsulosin  0.4 mg Oral q morning   Continuous Infusions:  azithromycin 500 mg (07/30/21 2218)   ceFEPime (MAXIPIME) IV Stopped (07/30/21 0900)     LOS: 1 day    Greater than 50% of this time was spent in counseling, explanation of diagnosis, planning of further management, and coordination of care.   Voice Recognition Viviann Spare dictation system was used to create this note, attempts have been made to correct errors. Please contact the author with questions and/or clarifications.   Florencia Reasons, MD PhD FACP [facility]  Available via Epic secure chat 7am-7pm for nonurgent issues Please page for urgent issues To page the attending provider between 7A-7P or the covering provider during after hours 7P-7A, please log into the web site [email or web address] and access using universal [place] password for that web site. If you do not have the password, please call the hospital operator.    07/30/2021, 10:51 PM

## 2021-07-30 NOTE — Progress Notes (Signed)
07/30/21 1100  SLP Visit Information  SLP Received On 07/30/21  Subjective  Subjective pt awake in bed, breakfast tray in front of him  Patient/Family Stated Goal to get his pain controlled  General Information  Date of Onset  (2 weeks ago)  HPI pt isa 88 yo male adm to [facility] with penile discomfort - found to have low BP and CXR indicating pna. 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 Pt recently moved to [place] and stays at [facility].  Swallow eval ordered. He denies having prior swallow evaluation.  Type of Study Bedside Swallow Evaluation  Diet Prior to this Study Dysphagia 1 (puree);Thin liquids  Temperature Spikes Noted No  Respiratory Status Room air  History of Recent Intubation No  Behavior/Cognition Alert;Cooperative;Pleasant mood;Other (Comment)  Oral Cavity Assessment Other (comment);Erythema (obvious surgical changes and obturator in place)  Oral Care Completed by SLP No (pt eating)  Oral Cavity - Dentition Missing dentition;Other (Comment) (obturator in place)  Vision Functional for self-feeding  Self-Feeding Abilities Able to feed self  Patient Positioning Upright in bed  Baseline Vocal Quality Normal  Volitional Cough Weak  Volitional Swallow Able to elicit  Oral Motor/Sensory Function  Overall Oral Motor/Sensory Function  (? edema vs labial asymmetry left to right labial, decreased lingual movement, portion of palate removed)  Ice Chips  Ice chips NT  Thin Liquid  Thin Liquid Impaired  Presentation Cup ("bottle? with nipple cut short)  Oral Phase Impairments Reduced labial seal;Reduced lingual movement/coordination  Oral Phase Functional Implications Left anterior spillage  Pharyngeal  Phase Impairments Cough - Delayed  Other Comments cough x4 of 8 boluses  Nectar Thick Liquid  Nectar Thick Liquid Impaired  Presentation Self Fed;Cup (bottle with nipple cut)  Oral Phase Impairments Reduced labial  seal;Reduced lingual movement/coordination  Oral phase functional implications Left anterior spillage  Pharyngeal Phase Impairments Cough - Delayed  Other Comments cough x1 of 8 boluses  Honey Thick Liquid  Honey Thick Liquid NT  Puree  Puree Impaired  Presentation Spoon;Self Fed  Oral Phase Impairments Reduced labial seal;Reduced lingual movement/coordination  Oral Phase Functional Implications Left anterior spillage;Prolonged oral transit;Oral residue  Solid  Solid NT  SLP - End of Session  Patient left in bed;with call bell/phone within reach;with bed alarm set  Nurse Communication Aspiration precautions reviewed;Diet recommendation;Swallow strategies reviewed (with NT)  Suspected Esophageal Findings  Suspected Esophageal Findings Belching;Other (comment)  SLP Assessment  Clinical Impression Statement (ACUTE ONLY) Pt's evaluation was lengthy due to his severe hearing loss prompting SLP to write for ease of communication.  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  Decreased labial seal resulting in anterior labial loss on left, decreased lingual movement with prolonged oral transiting and suspected premature loss of liquids into pharynx noted. Pt with cough responses post-swallow without good awareness. This is concerning for aspiration of liquids - given his pulmonary status.  Pt determined he did not want to have swallow test conducted in xray at this time as he main issue is he penile pain - which SlP respects. Thus conducted second session for diagnostic treatment to help mitigate aspiration.  SLP Visit Diagnosis Dysphagia, oral phase (R13.11);Dysphagia, unspecified (R13.10)  Impact on safety and function Moderate aspiration risk;Risk for inadequate nutrition/hydration  Other Related Risk Factors History of pneumonia  Swallow Evaluation Recommendations  SLP Diet Recommendations Thin liquid;Dysphagia 1 (Puree)   Liquid Administration via  Spoon;Cup  Medication Administration Whole meds with liquid  Supervision Patient able to self feed  Compensations Slow rate;Small sips/bites  Postural Changes Seated upright at 90 degrees;Remain upright for at least 30 minutes after po intake  Treatment Plan  Oral Care Recommendations Oral care before and after PO  Other Recommendations Other (Comment)  Treatment Recommendations F/U MBS in ___ days (Comment);Other (Comment) (in several days as pt able and willing)  Follow Up Recommendations Other (comment)  Assistance recommended at discharge None  Functional Status Assessment Patient has had a recent decline in their functional status and demonstrates the ability to make significant improvements in function in a reasonable and predictable amount of time.  Speech Therapy Frequency (ACUTE ONLY) min 1 x/week  Treatment Duration 1 week  Interventions Aspiration precaution training;Oral motor exercises;Compensatory techniques;Patient/family education  Prognosis  Prognosis for Safe Diet Advancement Fair  Barriers to Reach Goals Time post onset  Individuals Consulted  Consulted and Agree with Results and Recommendations Patient;RN;Other (Comment)  Progression Toward Goals  Progression toward goals Progressing toward goals  SLP Time Calculation  SLP Start Time (ACUTE ONLY) 1025  SLP Stop Time (ACUTE ONLY) 1110  SLP Time Calculation (min) (ACUTE ONLY) 45 min  SLP Evaluations  $ SLP Speech Visit 1 Visit  SLP Evaluations  $BSS Swallow 1 Procedure  Kathleen Lime, MS [facility] SLP Acute Rehab Services Office [phone] Cell [phone]

## 2021-07-30 NOTE — ED Notes (Signed)
Jeannette Corpus informed the patient's BP was 84/52 via epic secure chat. See orders

## 2021-07-30 NOTE — ED Notes (Signed)
The attending has been paged about INR critical lab.

## 2021-07-31 LAB — CBC WITH DIFFERENTIAL/PLATELET
Abs Immature Granulocytes: 0.03 10*3/uL (ref 0.00–0.07)
Basophils Absolute: 0 10*3/uL (ref 0.0–0.1)
Basophils Relative: 0 %
Eosinophils Absolute: 0.3 10*3/uL (ref 0.0–0.5)
Eosinophils Relative: 3 %
HCT: 36.4 % — ABNORMAL LOW (ref 39.0–52.0)
Hemoglobin: 11.6 g/dL — ABNORMAL LOW (ref 13.0–17.0)
Immature Granulocytes: 0 %
Lymphocytes Relative: 21 %
Lymphs Abs: 1.9 10*3/uL (ref 0.7–4.0)
MCH: 35.8 pg — ABNORMAL HIGH (ref 26.0–34.0)
MCHC: 31.9 g/dL (ref 30.0–36.0)
MCV: 112.3 fL — ABNORMAL HIGH (ref 80.0–100.0)
Monocytes Absolute: 1.1 10*3/uL — ABNORMAL HIGH (ref 0.1–1.0)
Monocytes Relative: 12 %
Neutro Abs: 5.8 10*3/uL (ref 1.7–7.7)
Neutrophils Relative %: 64 %
Platelets: 188 10*3/uL (ref 150–400)
RBC: 3.24 MIL/uL — ABNORMAL LOW (ref 4.22–5.81)
RDW: 13.1 % (ref 11.5–15.5)
WBC: 9.2 10*3/uL (ref 4.0–10.5)
nRBC: 0 % (ref 0.0–0.2)

## 2021-07-31 LAB — BASIC METABOLIC PANEL
Anion gap: 5 (ref 5–15)
BUN: 22 mg/dL (ref 8–23)
CO2: 27 mmol/L (ref 22–32)
Calcium: 9.6 mg/dL (ref 8.9–10.3)
Chloride: 108 mmol/L (ref 98–111)
Creatinine, Ser: 0.71 mg/dL (ref 0.61–1.24)
GFR, Estimated: >60 mL/min (ref >60)
Glucose, Bld: 78 mg/dL (ref 70–99)
Potassium: 3.6 mmol/L (ref 3.5–5.1)
Sodium: 140 mmol/L (ref 135–145)

## 2021-07-31 LAB — PROCALCITONIN: Procalcitonin: 0.24 ng/mL

## 2021-07-31 LAB — MAGNESIUM: Magnesium: 1.6 mg/dL — ABNORMAL LOW (ref 1.7–2.4)

## 2021-07-31 LAB — URINE CULTURE

## 2021-07-31 LAB — PROTIME-INR
INR: 2.2 — ABNORMAL HIGH (ref 0.8–1.2)
Prothrombin Time: 24.8 seconds — ABNORMAL HIGH (ref 11.4–15.2)

## 2021-07-31 MED ORDER — WARFARIN - PHARMACIST DOSING INPATIENT: Freq: Every day | Status: DC

## 2021-07-31 MED ORDER — SODIUM CHLORIDE 0.9 % IV SOLN: 1.0000 g | Freq: Two times a day (BID) | INTRAVENOUS | Status: DC

## 2021-07-31 MED ORDER — POTASSIUM CHLORIDE CRYS ER 20 MEQ PO TBCR
40.0000 meq | EXTENDED_RELEASE_TABLET | Freq: Once | ORAL | Status: AC
  Administered 2021-07-31: 40 meq via ORAL
  Filled 2021-07-31: qty 2

## 2021-07-31 MED ORDER — WARFARIN SODIUM 3 MG PO TABS
3.0000 mg | ORAL_TABLET | Freq: Once | ORAL | Status: AC
  Administered 2021-07-31: 3 mg via ORAL
  Filled 2021-07-31 (×2): qty 1

## 2021-07-31 MED ORDER — SODIUM CHLORIDE 0.9 % IV SOLN
2.0000 g | Freq: Two times a day (BID) | INTRAVENOUS | Status: DC
  Administered 2021-07-31 – 2021-08-02 (×5): 2 g via INTRAVENOUS
  Filled 2021-07-31 (×6): qty 2

## 2021-07-31 MED ORDER — TRAMADOL HCL 50 MG PO TABS
25.0000 mg | ORAL_TABLET | Freq: Four times a day (QID) | ORAL | Status: DC | PRN
  Administered 2021-07-31 – 2021-08-05 (×14): 25 mg via ORAL
  Filled 2021-07-31 (×15): qty 1

## 2021-07-31 MED ORDER — MAGNESIUM SULFATE 2 GM/50ML IV SOLN
2.0000 g | Freq: Once | INTRAVENOUS | Status: AC
  Administered 2021-07-31: 2 g via INTRAVENOUS
  Filled 2021-07-31: qty 50

## 2021-07-31 NOTE — Evaluation (Signed)
Physical Therapy Evaluation Patient Details Name: Craig Snow MRN: [id number] DOB: April 18, 1921 Today's Date: 07/31/2021  History of Present Illness  Pt is a 88 y.o.  male sent from ALF to [facility] ED due to increase gential pain x2wks,UTI due to chronic indwelling urinary catheter- indwelling Foley last changed on 1/16, CXR indicated possible pneumonia. PMH significant for bladder cancer and chronic urinary retention, a-fib, HTN, and HLD,   Clinical Impression  Pt is a 88 y.o. male with above HPI resulting in the deficits listed below (see PT Problem List). Pt performed sit to stand transfers with MIN A for power up and cues for safe hand placement. Pt ambulated total of ~81ft with MIN A for stability and use of RW. Pt reports that he is modified independent with rollator at baseline, but has not been very ambulatory for past 2-3 weeks due to pain, decreased intake, and feeling fatigue/weak. Pt currently resides at [place] and has son who visits him few times per week. If ALF facility at [facility] able to provide assistance with all mobility, recommend return to ALF. If unable, recommend SNF for short term rehab prior to return home due to increased assist required to maintain safety with mobility. Hopeful that pt will progress with mobility during hospital stay. Pt will benefit from skilled PT to maximize functional mobility to increase independence.         Recommendations for follow up therapy are one component of a multi-disciplinary discharge planning process, led by the attending physician.  Recommendations may be updated based on patient status, additional functional criteria and insurance authorization.  Follow Up Recommendations Skilled nursing-short term rehab (<3 hours/day) (If ALF facility at [facility] able to provide assistance with all mobility, recommend return to ALF. If unable, recommend SNF for short term rehab prior to return home due to increased assist required to maintain safety  with mobility.)    Assistance Recommended at Discharge Frequent or constant Supervision/Assistance  Patient can return home with the following  A little help with walking and/or transfers;A little help with bathing/dressing/bathroom;Assist for transportation;Help with stairs or ramp for entrance;Assistance with cooking/housework    Equipment Recommendations None recommended by PT  Recommendations for Other Services       Functional Status Assessment Patient has had a recent decline in their functional status and demonstrates the ability to make significant improvements in function in a reasonable and predictable amount of time.     Precautions / Restrictions Precautions Precautions: Fall Restrictions Weight Bearing Restrictions: No      Mobility  Bed Mobility Overal bed mobility: Needs Assistance Bed Mobility: Supine to Sit     Supine to sit: Min guard, HOB elevated     General bed mobility comments: increased time required due to onset of pain, required rest breaks throughout.    Transfers Overall transfer level: Needs assistance Equipment used: Rolling walker (2 wheels) Transfers: Sit to/from Stand Sit to Stand: Min assist           General transfer comment: MIN A to power up with cues for safe hand placement, pt initially anxious about attempting OOB mobility reporting that he has not been up and ambulating with walker in few weeks.    Ambulation/Gait Ambulation/Gait assistance: Min assist Gait Distance (Feet): 22 Feet Assistive device: Rolling walker (2 wheels) Gait Pattern/deviations: Decreased stride length, Step-to pattern, Shuffle, Narrow base of support Gait velocity: decr     General Gait Details: Decreased gait speed and MIN A required for stability with ambulation,  intermittent narrowed BOS.  Stairs            Wheelchair Mobility    Modified Rankin (Stroke Patients Only)       Balance Overall balance assessment: Needs  assistance Sitting-balance support: Feet supported Sitting balance-Leahy Scale: Good     Standing balance support: Bilateral upper extremity supported, During functional activity, Reliant on assistive device for balance Standing balance-Leahy Scale: Poor                               Pertinent Vitals/Pain Pain Assessment Pain Assessment: Faces Faces Pain Scale: Hurts whole lot Pain Location: genitals Pain Descriptors / Indicators: Grimacing Pain Intervention(s): Limited activity within patient's tolerance, Monitored during session, Repositioned (States he has increased in pain after he eats/drinks)    Home Living Family/patient expects to be discharged to:: Assisted living [facility])                 Home Equipment: Rollator (4 wheels) Additional Comments: has son who visit 3-4 times per week    Prior Function Prior Level of Function : Needs assist       Physical Assist : ADLs (physical)   ADLs (physical): Bathing Mobility Comments: Not been up walking much in past 2-3 weeks since having loss of appetite and feeling weaker ADLs Comments: Reports (I) with dressing, son asisst with bathing. Has dining hall at ALF, but recently has meals brought to him.     Hand Dominance        Extremity/Trunk Assessment   Upper Extremity Assessment Upper Extremity Assessment: Overall WFL for tasks assessed    Lower Extremity Assessment Lower Extremity Assessment: RLE deficits/detail;LLE deficits/detail RLE Deficits / Details: grossly 4+/5, Hip flexion 4/5 LLE Deficits / Details: grossly 4+/5, Hip flexion 4/5    Cervical / Trunk Assessment Cervical / Trunk Assessment: Normal  Communication   Communication: HOH (sometimes difficult to understand speech)  Cognition Arousal/Alertness: Awake/alert Behavior During Therapy: WFL for tasks assessed/performed Overall Cognitive Status: Within Functional Limits for tasks assessed                                           General Comments      Exercises     Assessment/Plan    PT Assessment Patient needs continued PT services  PT Problem List Decreased strength;Decreased activity tolerance;Decreased balance;Decreased mobility;Decreased knowledge of use of DME;Pain       PT Treatment Interventions DME instruction;Gait training;Functional mobility training;Therapeutic activities;Therapeutic exercise;Balance training;Patient/family education    PT Goals (Current goals can be found in the Care Plan section)  Acute Rehab PT Goals Patient Stated Goal: Get back to feeling better and walking PT Goal Formulation: With patient Time For Goal Achievement: 08/14/21 Potential to Achieve Goals: Good    Frequency Min 3X/week     Co-evaluation               AM-PAC PT "6 Clicks" Mobility  Outcome Measure Help needed turning from your back to your side while in a flat bed without using bedrails?: A Little Help needed moving from lying on your back to sitting on the side of a flat bed without using bedrails?: A Little Help needed moving to and from a bed to a chair (including a wheelchair)?: A Little Help needed standing up from a chair using your arms (  e.g., wheelchair or bedside chair)?: A Little Help needed to walk in hospital room?: A Little Help needed climbing 3-5 steps with a railing? : A Lot 6 Click Score: 17    End of Session Equipment Utilized During Treatment: Gait belt Activity Tolerance: Patient tolerated treatment well Patient left: in chair;with call bell/phone within reach;with chair alarm set Nurse Communication: Mobility status PT Visit Diagnosis: Unsteadiness on feet (R26.81);Muscle weakness (generalized) (M62.81);Difficulty in walking, not elsewhere classified (R26.2);Pain Pain - Right/Left:  (genital area/penis)    Time: 8590-9311 PT Time Calculation (min) (ACUTE ONLY): 31 min   Charges:   PT Evaluation $PT Eval Low Complexity: 1 Low PT  Treatments $Therapeutic Activity: 8-22 mins        Festus Barren PT, DPT  Acute Rehabilitation Services  Office [phone]  07/31/2021, 1:30 PM

## 2021-07-31 NOTE — Progress Notes (Signed)
PROGRESS NOTE  Craig Snow POE:[id number] DOB: Mar 22, 1921 DOA: 07/29/2021 PCP: Pcp, No   LOS: 2 days   Brief Narrative / Interim history: 88 year old male with history of A. fib on Coumadin, remote history of palate cancer, HTN, HLD, recent bladder cancer with chronic Foley who comes in from ALF with increasing penile pain.  He was recently diagnosed with bladder cancer about 4 to 5 months ago, underwent TURP, his chronic indwelling Foley catheter and currently undergoing BCG therapy.  He has been having dysuria and lower abdominal pain.  Subjective / 24h Interval events: Continues to complain of suprapubic pain  Assessment & Plan: Principal problem UTI due to chronic indwelling urinary catheter, history of bladder cancer-he failed outpatient therapy with Macrobid.  Patient was started on cefepime, continue, monitor urine cultures.  He was also found to have genital erythema with concern for Fournier's gangrene and urology consulted.  This is unlikely and physical exam and imaging most likely suggest a superficial infection such as fungal, treated with topical clotrimazole.  Continue to monitor cultures for now, will establish care with Dr. Claudia Desanctis next week as an outpatient  Active problems Bilateral pneumonia, aspiration pneumonia-chest x-ray on admission showed patchy left greater than right basilar opacities concerning for pneumonia.  He complains of a cough but no significant other respiratory symptoms.  He is on antibiotics as above, monitor cultures  Penile candidiasis-continue clotrimazole.  Improving  Chronic atrial fibrillation-with RVR on admission.  Rates much improved, continue metoprolol.  Continue Coumadin per pharmacy.  INR supratherapeutic on admission at 5.1 but improved to 2.2 this morning  Hypothyroidism - he is on Synthroid and liothyronine.  Continue.  TSH unremarkable 1.4  Hyperlipidemia-continue statin  Hypomagnesemia-replete magnesium, keep potassium at 4  Coccyx  pressure injury, stage I, POA Pressure Injury 07/30/21 Coccyx Mid Stage 1 -  Intact skin with non-blanchable redness of a localized area usually over a bony prominence. (Active)  07/30/21 2045  Location: Coccyx  Location Orientation: Mid  Staging: Stage 1 -  Intact skin with non-blanchable redness of a localized area usually over a bony prominence.  Wound Description (Comments):   Present on Admission: Yes   Scheduled Meds:  atorvastatin  10 mg Oral QHS   Chlorhexidine Gluconate Cloth  6 each Topical Daily   clotrimazole   Topical BID   levothyroxine  50 mcg Oral q morning   liothyronine  5 mcg Oral q morning   metoprolol tartrate  25 mg Oral BID   mirtazapine  15 mg Oral QHS   pantoprazole  40 mg Oral q morning   polyethylene glycol  17 g Oral q morning   tamsulosin  0.4 mg Oral q morning   Continuous Infusions:  azithromycin 500 mg (07/30/21 2218)   ceFEPime (MAXIPIME) IV 2 g (07/31/21 0412)   PRN Meds:.acetaminophen, food thickener, HYDROcodone-acetaminophen, melatonin, phenazopyridine  Diet Orders (From admission, onward)     Start     Ordered   07/30/21 1223  DIET - DYS 1 Room service appropriate? Yes; Fluid consistency: Thin  Diet effective now       Comments: EXTRA GRAVY AND SAUCE ON SIDE WITH EVERY MEAL  Question Answer Comment  Room service appropriate? Yes   Fluid consistency: Thin      07/30/21 1222            DVT prophylaxis: SCDs Start: 07/29/21 2153   Lab Results  Component Value Date   PLT 188 07/31/2021      Code Status: DNR  Family Communication: No family at bedside  Status is: Inpatient  Remains inpatient appropriate because: Persistent symptoms  Level of care: Telemetry  Consultants:  Urology  Procedures:  none  Microbiology  Urine cultures -multiple species Blood cultures 1/26-no growth at 2 days  Antimicrobials: Cefepime, azithromycin   Objective: Vitals:   07/30/21 1609 07/30/21 2047 07/31/21 0004 07/31/21 0500  BP:  (!) 151/74 120/74 (!) 109/56 (!) 163/78  Pulse: (!) 107 86 (!) 44 86  Resp: 20 18 20    Temp: 98.4 F (36.9 C) 98.7 F (37.1 C) 97.8 F (36.6 C) 97.7 F (36.5 C)  TempSrc: Oral Oral Oral Oral  SpO2: 96% 98%  100%  Weight:      Height:        Intake/Output Summary (Last 24 hours) at 07/31/2021 1124 Last data filed at 07/31/2021 0900 Gross per 24 hour  Intake 1197.12 ml  Output 575 ml  Net 622.12 ml   Wt Readings from Last 3 Encounters:  07/29/21 65.8 kg    Examination:  Constitutional: NAD Eyes: no scleral icterus ENMT: Mucous membranes are moist.  Neck: normal, supple Respiratory: clear to auscultation bilaterally, no wheezing, no crackles. Normal respiratory effort. No accessory muscle use.  Cardiovascular: Regular rate and rhythm, no murmurs / rubs / gallops.  Abdomen: non distended, no tenderness. Bowel sounds positive.  Musculoskeletal: no clubbing / cyanosis.  Skin: no rashes Neurologic: Nonfocal   Data Reviewed: I have independently reviewed following labs and imaging studies   CBC Recent Labs  Lab 07/29/21 2000 07/30/21 0650 07/31/21 0325  WBC 14.7* 10.0 9.2  HGB 13.4 12.5* 11.6*  HCT 40.2 37.3* 36.4*  PLT 204 192 188  MCV 108.9* 110.0* 112.3*  MCH 36.3* 36.9* 35.8*  MCHC 33.3 33.5 31.9  RDW 13.2 13.1 13.1  LYMPHSABS 1.5  --  1.9  MONOABS 1.3*  --  1.1*  EOSABS 0.0  --  0.3  BASOSABS 0.0  --  0.0    Recent Labs  Lab 07/29/21 2000 07/30/21 0650 07/31/21 0325  NA 137  --  140  K 3.9  --  3.6  CL 100  --  108  CO2 26  --  27  GLUCOSE 152*  --  78  BUN 41*  --  22  CREATININE 1.11  --  0.71  CALCIUM 11.0*  --  9.6  AST 31  --   --   ALT 20  --   --   ALKPHOS 79  --   --   BILITOT 1.2  --   --   ALBUMIN 3.9  --   --   MG  --   --  1.6*  PROCALCITON  --   --  0.24  LATICACIDVEN 1.8  --   --   INR 5.1* 5.0* 2.2*  TSH 1.439  --   --      ------------------------------------------------------------------------------------------------------------------ No results for input(s): CHOL, HDL, LDLCALC, TRIG, CHOLHDL, LDLDIRECT in the last 72 hours.  No results found for: HGBA1C ------------------------------------------------------------------------------------------------------------------ Recent Labs    07/29/21 2000  TSH 1.439    Cardiac Enzymes No results for input(s): CKMB, TROPONINI, MYOGLOBIN in the last 168 hours.  Invalid input(s): CK ------------------------------------------------------------------------------------------------------------------ No results found for: BNP  CBG: No results for input(s): GLUCAP in the last 168 hours.  Recent Results (from the past 240 hour(s))  Culture, blood (Routine x 2)     Status: None (Preliminary result)   Collection Time: 07/29/21  7:55 PM   Specimen:  BLOOD  Result Value Ref Range Status   Specimen Description   Final    BLOOD BLOOD LEFT FOREARM Performed at [facility] [address] [place], [place] [address]    Special Requests   Final    BOTTLES DRAWN AEROBIC AND ANAEROBIC Blood Culture results may not be optimal due to an excessive volume of blood received in culture bottles Performed at [facility] [address] [place], [place] [address]    Culture   Final    NO GROWTH 2 DAYS Performed at [facility] [address], [place], [place] [address]    Report Status PENDING  Incomplete  Culture, blood (Routine x 2)     Status: None (Preliminary result)   Collection Time: 07/29/21  8:00 PM   Specimen: BLOOD  Result Value Ref Range Status   Specimen Description   Final    BLOOD LEFT ANTECUBITAL Performed at [facility] [address] [place], [place] [address]    Special Requests   Final    BOTTLES DRAWN AEROBIC AND ANAEROBIC Blood Culture adequate volume Performed at [facility] [address], [place], [place] [address]    Culture   Final    NO GROWTH 2 DAYS Performed at [facility] [address], [place], [place] [address]    Report Status PENDING  Incomplete  Urine Culture     Status: Abnormal   Collection Time: 07/29/21  8:14 PM   Specimen: Urine, Clean Catch  Result Value Ref Range Status   Specimen Description   Final    URINE, CLEAN CATCH Performed at [facility], [place] [address] [place], [place] [address]    Special Requests   Final    NONE Performed at [facility], [place] [address], [place], [place] [address]    Culture MULTIPLE SPECIES PRESENT, SUGGEST RECOLLECTION (A)  Final   Report Status 07/31/2021 FINAL  Final  Resp Panel by RT-PCR (Flu A&B, Covid) Urine, Clean Catch     Status: None   Collection Time: 07/29/21  9:15 PM   Specimen: Urine, Clean Catch; Nasopharyngeal(NP) swabs in vial transport medium  Result Value Ref Range Status   SARS Coronavirus 2 by RT PCR NEGATIVE NEGATIVE Final    Comment: (NOTE) SARS-CoV-2 target nucleic acids are NOT DETECTED.  The SARS-CoV-2 RNA is generally detectable in upper respiratory specimens during the acute phase of infection. The lowest concentration of SARS-CoV-2 viral copies this assay can detect is 138 copies/mL. A negative result does not preclude SARS-Cov-2 infection and should not be used as the sole basis for treatment or other patient management decisions. A negative result may occur with  improper specimen collection/handling, submission of specimen other than nasopharyngeal swab, presence of viral mutation(s) within the areas targeted by this assay, and inadequate number of viral copies(<138 copies/mL). A negative result must be combined with clinical observations, patient history, and epidemiological information. The expected result is Negative.  Fact Sheet for Patients:   EntrepreneurPulse.com.au  Fact Sheet for Healthcare Providers:  IncredibleEmployment.be  This test is no t yet approved or cleared by the [place] FDA and  has been authorized for detection and/or diagnosis of SARS-CoV-2 by FDA under an Emergency Use Authorization (EUA). This EUA will remain  in effect (meaning this test can be used) for the duration of the COVID-19 declaration under Section 564(b)(1) of the Act, 21 U.S.C.section 360bbb-3(b)(1), unless the authorization  is terminated  or revoked sooner.       Influenza A by PCR NEGATIVE NEGATIVE Final   Influenza B by PCR NEGATIVE NEGATIVE Final    Comment: (NOTE) The Xpert Xpress SARS-CoV-2/FLU/RSV plus assay is intended as an aid in the diagnosis of influenza from Nasopharyngeal swab specimens and should not be used as a sole basis for treatment. Nasal washings and aspirates are unacceptable for Xpert Xpress SARS-CoV-2/FLU/RSV testing.  Fact Sheet for Patients: EntrepreneurPulse.com.au  Fact Sheet for Healthcare Providers: IncredibleEmployment.be  This test is not yet approved or cleared by the [place] FDA and has been authorized for detection and/or diagnosis of SARS-CoV-2 by FDA under an Emergency Use Authorization (EUA). This EUA will remain in effect (meaning this test can be used) for the duration of the COVID-19 declaration under Section 564(b)(1) of the Act, 21 U.S.C. section 360bbb-3(b)(1), unless the authorization is terminated or revoked.  Performed at [facility], [place] [address], [place], [place] [address]      Radiology Studies: CT ABDOMEN PELVIS W CONTRAST  Result Date: 07/30/2021 CLINICAL DATA:  Complicated urinary tract infection. Clinical concern for Fournier's gangrene. Chronic low pelvic pain. History of bladder cancer. EXAM: CT ABDOMEN AND PELVIS WITH CONTRAST TECHNIQUE: Multidetector CT imaging of  the abdomen and pelvis was performed using the standard protocol following bolus administration of intravenous contrast. RADIATION DOSE REDUCTION: This exam was performed according to the departmental dose-optimization program which includes automated exposure control, adjustment of the mA and/or kV according to patient size and/or use of iterative reconstruction technique. CONTRAST:  145mL OMNIPAQUE IOHEXOL 300 MG/ML  SOLN COMPARISON:  Chest radiographs 07/29/2021. FINDINGS: Lower chest: Images through the lung bases are degraded by breathing artifact. There are patchy dependent ground-glass opacities at both lung bases which remain suspicious for infection. There is a moderate size hiatal hernia. Atherosclerosis of the aorta and coronary arteries noted. No significant pleural or pericardial effusion. Hepatobiliary: The liver is normal in density without suspicious focal abnormality. No evidence of gallstones, gallbladder wall thickening or biliary dilatation. Pancreas: Diffusely atrophied. No evidence of focal mass lesion, ductal dilatation or surrounding inflammation. Spleen: Normal in size without focal abnormality. Adrenals/Urinary Tract: Both adrenal glands appear normal. Mild renal cortical thinning bilaterally. No evidence of hydronephrosis, renal or ureteral calculus. A Foley catheter is in place with non dependent air in the bladder. There is irregular bladder wall thickening without surrounding inflammation. Dependently in the right side of the bladder, there is a 7 mm calcification on image 76/2, likely a bladder calculus. Stomach/Bowel: No enteric contrast administered. As above, there is a moderate size hiatal hernia. The stomach otherwise appears unremarkable for its degree of distention. No evidence of bowel wall thickening, significant distention or surrounding inflammation. There is a diverticulum of the 2nd portion of the duodenum. The appendix appears normal. There are mild diverticular changes  and mildly prominent stool throughout the colon. A descending colonic anastomosis is noted. Vascular/Lymphatic: There are no enlarged abdominal or pelvic lymph nodes. Diffuse aortic and branch vessel atherosclerosis without evidence of aneurysm or large vessel occlusion. Reproductive: The lower pelvis is partially obscured by beam hardening artifact from the right total hip arthroplasty. The prostate gland is moderately enlarged. No definite inflammatory changes or soft tissue emphysema identified within the perineum. There may be a very small amount of gas along the posterior aspect of the penis which could be located between the penis and scrotum, especially if the patient is not circumcised. Other: No evidence  of abdominal wall mass or hernia. No ascites. Musculoskeletal: No acute or significant osseous findings. There is multilevel lumbar spondylosis associated with a convex right scoliosis. Patient is status post right total hip arthroplasty. No inflammatory changes, fluid collections or soft tissue emphysema identified in the visualized proximal right thigh. IMPRESSION: 1. No definite inflammatory changes identified within the anterior perineum or visualized proximal right thigh. Assessment is mildly limited by artifact from the right total hip arthroplasty. A small amount of gas along the posterior aspect of the penis is not definitely pathologic, but requires clinical correlation. 2. Bladder wall thickening which may correspond with reported urinary tract infection and/or chronic bladder outlet obstruction. Small right-sided bladder calculus. Foley catheter in place. 3. No hydronephrosis or delayed contrast excretion. Mild renal cortical thinning bilaterally. 4. Patchy ground-glass opacities at both lung bases as seen on chest radiographs yesterday, consistent with pneumonia. Radiographic follow up recommended. 5. Additional incidental findings including a moderate size hiatal hernia, diffuse diverticular  changes throughout the colon, moderate enlargement of the prostate gland and Aortic Atherosclerosis (ICD10-I70.0). Electronically Signed   By: Richardean Sale M.D.   On: 07/30/2021 12:23     Marzetta Board, MD, PhD [facility]  Between 7 am - 7 pm I am available, please contact me via Amion (for emergencies) or Securechat (non urgent messages)  Between 7 pm - 7 am I am not available, please contact night coverage MD/APP via Amion

## 2021-07-31 NOTE — Plan of Care (Signed)

## 2021-07-31 NOTE — Progress Notes (Signed)
ANTICOAGULATION CONSULT NOTE - Initial Consult  Pharmacy Consult for warfarin Indication: atrial fibrillation  Allergies  Allergen Reactions   Aspirin Other (See Comments)    Unknown reaction - listed on [facility] 07/30/21   Clindamycin/Lincomycin Other (See Comments)    Unknown reaction - listed on [facility] 07/30/21   Other Other (See Comments)    Unknown reaction to opioids - listed on [facility] 07/30/21   Sulfa Antibiotics Other (See Comments)    Unknown reaction - listed on [facility] 07/30/21    Patient Measurements: Height: 5\' 11"  (180.3 cm) Weight: 65.8 kg (145 lb) IBW/kg (Calculated) : 75.3  Vital Signs: Temp: 98.2 F (36.8 C) (01/28 1157) Temp Source: Oral (01/28 1157) BP: 100/55 (01/28 1157) Pulse Rate: 56 (01/28 1157)  Labs: Recent Labs    07/29/21 2000 07/30/21 0650 07/31/21 0325  HGB 13.4 12.5* 11.6*  HCT 40.2 37.3* 36.4*  PLT 204 192 188  LABPROT 47.2* 46.5* 24.8*  INR 5.1* 5.0* 2.2*  CREATININE 1.11  --  0.71    Estimated Creatinine Clearance: 48 mL/min (by C-G formula based on SCr of 0.71 mg/dL).   Medical History: Past Medical History:  Diagnosis Date   Atrial fibrillation ([place])    Hyperlipidemia    Hypertension    Thyrotoxicosis     Medications: Pt prescribed warfarin PTA for atrial fibrillation -Per med rec, pt was self-administering medications prior to admission at ALF. Unable to confirm time/date of last warfarin dose PTA. INR was supratherapeutic on admission -Most recent reported regimen from ALF: 2 mg Sun, Tues; 3 mg all other days. Unable to confirm compliance.   Significant Events: -INR 5.1 on admission; s/p vitamin K 2.5 mg PO on 1/27  Assessment: Pt is a 88 year old male presenting from SNF with UTI. Pt prescribed warfarin prior to admission for atrial fibrillation. INR now therapeutic s/p vitamin K, pharmacy consulted to resume warfarin dosing inpatient.  Today, 07/31/21 INR 2.2 is therapeutic CBC: Hgb slightly low and decreased; Plt WNL Diet:  Dysphagia 1 No major DDI. Pt is on antibiotics which may enhance effects of warfarin.  Goal of Therapy:  INR 2-3 Monitor platelets by anticoagulation protocol: Yes   Plan:  Warfarin 3 mg PO once today CBC with AM labs tomorrow; INR daily  Lenis Noon, PharmD 07/31/2021,12:00 PM

## 2021-07-31 NOTE — Progress Notes (Signed)
Pharmacy Antibiotic Note  Craig Snow is a 88 y.o. male admitted on 07/29/2021 with UTI. Pt has chronic Foley, last changed on 07/19/21. Also being treated for PNA. Pharmacy has been consulted for cefepime dosing.  Today, 07/31/21 SCr 0.71, CrCl ~48 mL/min WBC now WNL PCT 0.24  Plan: Increase cefepime to 2 g IV q12h since treating for PNA in addition to UTI and renal function has improved Monitor renal function and culture data  Height: 5\' 11"  (180.3 cm) Weight: 65.8 kg (145 lb) IBW/kg (Calculated) : 75.3  Temp (24hrs), Avg:98.2 F (36.8 C), Min:97.7 F (36.5 C), Max:98.7 F (37.1 C)  Recent Labs  Lab 07/29/21 2000 07/30/21 0650 07/31/21 0325  WBC 14.7* 10.0 9.2  CREATININE 1.11  --  0.71  LATICACIDVEN 1.8  --   --     Estimated Creatinine Clearance: 48 mL/min (by C-G formula based on SCr of 0.71 mg/dL).    Allergies  Allergen Reactions   Aspirin Other (See Comments)    Unknown reaction - listed on [facility] 07/30/21   Clindamycin/Lincomycin Other (See Comments)    Unknown reaction - listed on [facility] 07/30/21   Other Other (See Comments)    Unknown reaction to opioids - listed on [facility] 07/30/21   Sulfa Antibiotics Other (See Comments)    Unknown reaction - listed on [facility] 07/30/21    Antimicrobials this admission: azithromycin 1/26 >>  cefepime 1/27 >>  Ceftriaxone x1 dose on 1/26  Dose adjustments this admission: 1/28: Cefepime 2 g IV q24h >> 2 g IV q12h  Microbiology results: 1/26 BCx: ngtd 1/26 UCx: multiple species, suggest recollection   Lenis Noon, PharmD 07/31/2021 11:48 AM

## 2021-08-01 LAB — CBC
HCT: 33 % — ABNORMAL LOW (ref 39.0–52.0)
Hemoglobin: 10.7 g/dL — ABNORMAL LOW (ref 13.0–17.0)
MCH: 36 pg — ABNORMAL HIGH (ref 26.0–34.0)
MCHC: 32.4 g/dL (ref 30.0–36.0)
MCV: 111.1 fL — ABNORMAL HIGH (ref 80.0–100.0)
Platelets: 175 10*3/uL (ref 150–400)
RBC: 2.97 MIL/uL — ABNORMAL LOW (ref 4.22–5.81)
RDW: 13.1 % (ref 11.5–15.5)
WBC: 9.8 10*3/uL (ref 4.0–10.5)
nRBC: 0 % (ref 0.0–0.2)

## 2021-08-01 LAB — COMPREHENSIVE METABOLIC PANEL
ALT: 17 U/L (ref 0–44)
AST: 26 U/L (ref 15–41)
Albumin: 2.8 g/dL — ABNORMAL LOW (ref 3.5–5.0)
Alkaline Phosphatase: 53 U/L (ref 38–126)
Anion gap: 3 — ABNORMAL LOW (ref 5–15)
BUN: 22 mg/dL (ref 8–23)
CO2: 27 mmol/L (ref 22–32)
Calcium: 9.1 mg/dL (ref 8.9–10.3)
Chloride: 109 mmol/L (ref 98–111)
Creatinine, Ser: 0.79 mg/dL (ref 0.61–1.24)
GFR, Estimated: >60 mL/min (ref >60)
Glucose, Bld: 90 mg/dL (ref 70–99)
Potassium: 3.8 mmol/L (ref 3.5–5.1)
Sodium: 139 mmol/L (ref 135–145)
Total Bilirubin: 0.8 mg/dL (ref 0.3–1.2)
Total Protein: 5.3 g/dL — ABNORMAL LOW (ref 6.5–8.1)

## 2021-08-01 LAB — PROTIME-INR
INR: 1.6 — ABNORMAL HIGH (ref 0.8–1.2)
Prothrombin Time: 19.4 seconds — ABNORMAL HIGH (ref 11.4–15.2)

## 2021-08-01 LAB — MAGNESIUM: Magnesium: 1.8 mg/dL (ref 1.7–2.4)

## 2021-08-01 LAB — PROCALCITONIN: Procalcitonin: 0.16 ng/mL

## 2021-08-01 MED ORDER — MIRABEGRON ER 25 MG PO TB24
25.0000 mg | ORAL_TABLET | Freq: Every day | ORAL | Status: DC
  Administered 2021-08-01 – 2021-08-06 (×6): 25 mg via ORAL
  Filled 2021-08-01 (×6): qty 1

## 2021-08-01 MED ORDER — WARFARIN SODIUM 3 MG PO TABS
3.0000 mg | ORAL_TABLET | Freq: Once | ORAL | Status: AC
  Administered 2021-08-01: 3 mg via ORAL
  Filled 2021-08-01: qty 1

## 2021-08-01 NOTE — Progress Notes (Signed)
PROGRESS NOTE  Craig Snow MWU:[id number] DOB: 05-20-1921 DOA: 07/29/2021 PCP: Pcp, No   LOS: 3 days   Brief Narrative / Interim history: 88 year old male with history of A. fib on Coumadin, remote history of palate cancer, HTN, HLD, recent bladder cancer with chronic Foley who comes in from ALF with increasing penile pain.  He was recently diagnosed with bladder cancer about 4 to 5 months ago, underwent TURP, his chronic indwelling Foley catheter and currently undergoing BCG therapy.  He has been having dysuria and lower abdominal pain.  Subjective / 24h Interval events: Continues to have intermittent bladder cramping, he is not sure whether it is getting any better  Assessment & Plan: Principal problem UTI due to chronic indwelling urinary catheter, history of bladder cancer-he failed outpatient therapy with Macrobid.  Patient was started on cefepime, continue, monitor urine cultures.  He was also found to have genital erythema with concern for Fournier's gangrene and urology consulted.  This is unlikely and physical exam and imaging most likely suggest a superficial infection such as fungal, treated with topical clotrimazole.  Urine cultures with multiple species, would keep on IV antibiotics for now transition to p.o. on discharge to SNF, will establish care with Dr. Claudia Desanctis next week as an outpatient -He has been having significant bladder cramping, discussed with Dr. Alinda Money, start Myrbetriq today.  If this does not help as much could potentially try to replace the Foley  Active problems Bilateral pneumonia, aspiration pneumonia-chest x-ray on admission showed patchy left greater than right basilar opacities concerning for pneumonia.  He complains of a cough but no significant other respiratory symptoms.  Continue antibiotics as above, cultures still negative  Penile candidiasis-continue clotrimazole.  Improving  Chronic atrial fibrillation-with RVR on admission.  Rates much improved,  continue metoprolol.  Continue Coumadin per pharmacy.  INR supratherapeutic on admission at 5.1 but improved since.  Hypothyroidism - he is on Synthroid and liothyronine.  Continue.  TSH unremarkable 1.4  Hyperlipidemia-continue statin  Hypomagnesemia-replete magnesium, keep potassium at 4  Coccyx pressure injury, stage I, POA Pressure Injury 07/30/21 Coccyx Mid Stage 1 -  Intact skin with non-blanchable redness of a localized area usually over a bony prominence. (Active)  07/30/21 2045  Location: Coccyx  Location Orientation: Mid  Staging: Stage 1 -  Intact skin with non-blanchable redness of a localized area usually over a bony prominence.  Wound Description (Comments):   Present on Admission: Yes   Scheduled Meds:  atorvastatin  10 mg Oral QHS   Chlorhexidine Gluconate Cloth  6 each Topical Daily   clotrimazole   Topical BID   levothyroxine  50 mcg Oral q morning   liothyronine  5 mcg Oral q morning   metoprolol tartrate  25 mg Oral BID   mirabegron ER  25 mg Oral Daily   mirtazapine  15 mg Oral QHS   pantoprazole  40 mg Oral q morning   polyethylene glycol  17 g Oral q morning   tamsulosin  0.4 mg Oral q morning   Warfarin - Pharmacist Dosing Inpatient   Does not apply q1600   Continuous Infusions:  azithromycin 500 mg (07/31/21 2301)   ceFEPime (MAXIPIME) IV 2 g (07/31/21 2207)   PRN Meds:.acetaminophen, food thickener, HYDROcodone-acetaminophen, melatonin, phenazopyridine, traMADol  Diet Orders (From admission, onward)     Start     Ordered   07/30/21 1223  DIET - DYS 1 Room service appropriate? Yes; Fluid consistency: Thin  Diet effective now  Comments: EXTRA GRAVY AND SAUCE ON SIDE WITH EVERY MEAL  Question Answer Comment  Room service appropriate? Yes   Fluid consistency: Thin      07/30/21 1222            DVT prophylaxis: SCDs Start: 07/29/21 2153   Lab Results  Component Value Date   PLT 175 08/01/2021      Code Status: DNR  Family  Communication: No family at bedside  Status is: Inpatient  Remains inpatient appropriate because: Persistent symptoms  Level of care: Telemetry  Consultants:  Urology  Procedures:  none  Microbiology  Urine cultures -multiple species Blood cultures 1/26-no growth at 2 days  Antimicrobials: Cefepime, azithromycin   Objective: Vitals:   07/31/21 1300 07/31/21 2139 07/31/21 2217 08/01/21 0427  BP: (!) 96/56 (!) 100/46 (!) 110/54 129/71  Pulse: 64 67 65 76  Resp: 20 17  16   Temp:  98.2 F (36.8 C)  97.9 F (36.6 C)  TempSrc:  Oral  Oral  SpO2: 97% 98% 98% 95%  Weight:      Height:        Intake/Output Summary (Last 24 hours) at 08/01/2021 1024 Last data filed at 08/01/2021 0420 Gross per 24 hour  Intake 658.65 ml  Output 700 ml  Net -41.35 ml    Wt Readings from Last 3 Encounters:  07/29/21 65.8 kg    Examination:  Constitutional: NAD Eyes: lids and conjunctivae normal, no scleral icterus ENMT: mmm Neck: normal, supple Respiratory: clear to auscultation bilaterally, no wheezing, no crackles. Normal respiratory effort.  Cardiovascular: Regular rate and rhythm, no murmurs / rubs / gallops. No LE edema. Abdomen: soft, no distention, no tenderness. Bowel sounds positive.  Skin: no rashes Neurologic: no focal deficits, equal strength   Data Reviewed: I have independently reviewed following labs and imaging studies   CBC Recent Labs  Lab 07/29/21 2000 07/30/21 0650 07/31/21 0325 08/01/21 0316  WBC 14.7* 10.0 9.2 9.8  HGB 13.4 12.5* 11.6* 10.7*  HCT 40.2 37.3* 36.4* 33.0*  PLT 204 192 188 175  MCV 108.9* 110.0* 112.3* 111.1*  MCH 36.3* 36.9* 35.8* 36.0*  MCHC 33.3 33.5 31.9 32.4  RDW 13.2 13.1 13.1 13.1  LYMPHSABS 1.5  --  1.9  --   MONOABS 1.3*  --  1.1*  --   EOSABS 0.0  --  0.3  --   BASOSABS 0.0  --  0.0  --      Recent Labs  Lab 07/29/21 2000 07/30/21 0650 07/31/21 0325 08/01/21 0316  NA 137  --  140 139  K 3.9  --  3.6 3.8  CL 100   --  108 109  CO2 26  --  27 27  GLUCOSE 152*  --  78 90  BUN 41*  --  22 22  CREATININE 1.11  --  0.71 0.79  CALCIUM 11.0*  --  9.6 9.1  AST 31  --   --  26  ALT 20  --   --  17  ALKPHOS 79  --   --  53  BILITOT 1.2  --   --  0.8  ALBUMIN 3.9  --   --  2.8*  MG  --   --  1.6* 1.8  PROCALCITON  --   --  0.24 0.16  LATICACIDVEN 1.8  --   --   --   INR 5.1* 5.0* 2.2* 1.6*  TSH 1.439  --   --   --      ------------------------------------------------------------------------------------------------------------------  No results for input(s): CHOL, HDL, LDLCALC, TRIG, CHOLHDL, LDLDIRECT in the last 72 hours.  No results found for: HGBA1C ------------------------------------------------------------------------------------------------------------------ Recent Labs    07/29/21 2000  TSH 1.439     Cardiac Enzymes No results for input(s): CKMB, TROPONINI, MYOGLOBIN in the last 168 hours.  Invalid input(s): CK ------------------------------------------------------------------------------------------------------------------ No results found for: BNP  CBG: No results for input(s): GLUCAP in the last 168 hours.  Recent Results (from the past 240 hour(s))  Culture, blood (Routine x 2)     Status: None (Preliminary result)   Collection Time: 07/29/21  7:55 PM   Specimen: BLOOD  Result Value Ref Range Status   Specimen Description   Final    BLOOD BLOOD LEFT FOREARM Performed at [facility] [address] [place], [place] [address]    Special Requests   Final    BOTTLES DRAWN AEROBIC AND ANAEROBIC Blood Culture results may not be optimal due to an excessive volume of blood received in culture bottles Performed at [facility] [address], [place], [place] [address]    Culture   Final    NO GROWTH 3 DAYS Performed at [facility], [place] [address] [place], [place] [address]    Report Status PENDING  Incomplete  Culture, blood  (Routine x 2)     Status: None (Preliminary result)   Collection Time: 07/29/21  8:00 PM   Specimen: BLOOD  Result Value Ref Range Status   Specimen Description   Final    BLOOD LEFT ANTECUBITAL Performed at [facility] [address], [place], [place] [address]    Special Requests   Final    BOTTLES DRAWN AEROBIC AND ANAEROBIC Blood Culture adequate volume Performed at [facility] [address], [place], [place] [address]    Culture   Final    NO GROWTH 3 DAYS Performed at [facility], [place] [address], [place], [place] [address]    Report Status PENDING  Incomplete  Urine Culture     Status: Abnormal   Collection Time: 07/29/21  8:14 PM   Specimen: Urine, Clean Catch  Result Value Ref Range Status   Specimen Description   Final    URINE, CLEAN CATCH Performed at [facility], [place] [address] [place], [place] [address]    Special Requests   Final    NONE Performed at [facility], [place] [address] [place], [place] [address]    Culture MULTIPLE SPECIES PRESENT, SUGGEST RECOLLECTION (A)  Final   Report Status 07/31/2021 FINAL  Final  Resp Panel by RT-PCR (Flu A&B, Covid) Urine, Clean Catch     Status: None   Collection Time: 07/29/21  9:15 PM   Specimen: Urine, Clean Catch; Nasopharyngeal(NP) swabs in vial transport medium  Result Value Ref Range Status   SARS Coronavirus 2 by RT PCR NEGATIVE NEGATIVE Final    Comment: (NOTE) SARS-CoV-2 target nucleic acids are NOT DETECTED.  The SARS-CoV-2 RNA is generally detectable in upper respiratory specimens during the acute phase of infection. The lowest concentration of SARS-CoV-2 viral copies this assay can detect is 138 copies/mL. A negative result does not preclude SARS-Cov-2 infection and should not be used as the sole basis for treatment or other patient management decisions. A negative result may occur with  improper specimen  collection/handling, submission of specimen other than nasopharyngeal swab, presence of viral mutation(s) within the areas targeted by this assay, and inadequate  number of viral copies(<138 copies/mL). A negative result must be combined with clinical observations, patient history, and epidemiological information. The expected result is Negative.  Fact Sheet for Patients:  EntrepreneurPulse.com.au  Fact Sheet for Healthcare Providers:  IncredibleEmployment.be  This test is no t yet approved or cleared by the [place] FDA and  has been authorized for detection and/or diagnosis of SARS-CoV-2 by FDA under an Emergency Use Authorization (EUA). This EUA will remain  in effect (meaning this test can be used) for the duration of the COVID-19 declaration under Section 564(b)(1) of the Act, 21 U.S.C.section 360bbb-3(b)(1), unless the authorization is terminated  or revoked sooner.       Influenza A by PCR NEGATIVE NEGATIVE Final   Influenza B by PCR NEGATIVE NEGATIVE Final    Comment: (NOTE) The Xpert Xpress SARS-CoV-2/FLU/RSV plus assay is intended as an aid in the diagnosis of influenza from Nasopharyngeal swab specimens and should not be used as a sole basis for treatment. Nasal washings and aspirates are unacceptable for Xpert Xpress SARS-CoV-2/FLU/RSV testing.  Fact Sheet for Patients: EntrepreneurPulse.com.au  Fact Sheet for Healthcare Providers: IncredibleEmployment.be  This test is not yet approved or cleared by the [place] FDA and has been authorized for detection and/or diagnosis of SARS-CoV-2 by FDA under an Emergency Use Authorization (EUA). This EUA will remain in effect (meaning this test can be used) for the duration of the COVID-19 declaration under Section 564(b)(1) of the Act, 21 U.S.C. section 360bbb-3(b)(1), unless the authorization is terminated or revoked.  Performed at [facility], [place] [address] [place], [place] [address]       Radiology Studies: No results found.   Marzetta Board, MD, PhD [facility]  Between 7 am - 7 pm I am available, please contact me via Amion (for emergencies) or Securechat (non urgent messages)  Between 7 pm - 7 am I am not available, please contact night coverage MD/APP via Amion

## 2021-08-01 NOTE — TOC Initial Note (Signed)
Transition of Care [facility]) - Initial/Assessment Note    Patient Details  Name: Craig Snow MRN: [id number] Date of Birth: 04/26/1921  Transition of [facility]) CM/SW Contact:    Ross Ludwig, LCSW Phone Number: 08/01/2021, 6:32 PM  Clinical Narrative:                  Patient is a 88 year old male who is a resident at [facility].  Patient has been living there for a few weeks per son report. Per patient's son they would like patient to go to a SNF for rehab, and may look at a different ALFs while he is in rehab.  Per patient's son, patient has not been to SNF before.  CSW explained the process and what to expect, along with how insurance will pay for stay.  CSW explained that once patient discharges from here, then the social worker at the facility can assist with trying to find a different ALF if that is what they want.  CSW was given permission to begin bed search in [facility].                                                                                                                Expected Discharge Plan: Skilled Nursing Facility Barriers to Discharge: Continued Medical Work up   Patient Goals and CMS Choice Patient states their goals for this hospitalization and ongoing recovery are:: To go to SNF for short term rehab then return to ALF. CMS Medicare.gov Compare Post Acute Care list provided to:: Patient Represenative (must comment) Choice offered to / list presented to : Adult Children  Expected Discharge Plan and Services Expected Discharge Plan: [place] In-house Referral: Clinical Social Work   Post Acute Care Choice: [place] Living arrangements for the past 2 months: [place]                                      Prior Living Arrangements/Services Living arrangements for the past 2 months: [place] Lives with:: Facility Resident   Do you feel safe going back to the place where you  live?: No   Patient's family feels he needs rehab first before returning.           Activities of Daily Living Home Assistive Devices/Equipment: None ADL Screening (condition at time of admission) Patient's cognitive ability adequate to safely complete daily activities?: Yes Is the patient deaf or have difficulty hearing?: Yes Does the patient have difficulty seeing, even when wearing glasses/contacts?: No Does the patient have difficulty concentrating, remembering, or making decisions?: No Patient able to express need for assistance with ADLs?: Yes Does the patient have difficulty dressing or bathing?: Yes Independently performs ADLs?: Yes (appropriate for developmental age) Does the patient have difficulty walking or climbing stairs?: Yes Weakness of Legs: Both Weakness of Arms/Hands: Both  Permission Sought/Granted Permission sought to share information with :  Case Manager, Customer service manager, Family Supports Permission granted to share information with : Yes, Release of Information Signed              Emotional Assessment              Admission diagnosis:  Pain [R52] Atrial fibrillation with RVR ([place]) [I48.91] Patient Active Problem List   Diagnosis Date Noted   Acute lower UTI 07/30/2021   Community acquired pneumonia 07/30/2021   Supratherapeutic INR 07/30/2021   Candidiasis of penis 07/30/2021   Stage 1 skin ulcer of sacral region [facility]) 07/30/2021   Bladder cancer ([place]) 07/30/2021   Atrial fibrillation with RVR ([place]) 07/29/2021   PCP:  Pcp, No Pharmacy:  No Pharmacies Listed    Social Determinants of Health (SDOH) Interventions    Readmission Risk Interventions No flowsheet data found.

## 2021-08-01 NOTE — NC FL2 (Signed)
[place] LEVEL OF CARE SCREENING TOOL     IDENTIFICATION  Patient Name: Craig Snow Birthdate: [birth year] Sex: male Admission Date (Current Location): 07/29/2021  [facility] and [place] Number:  Herbalist and Address:  [facility],  [place], [place]      Provider Number: [id number]  Attending Physician Name and Address:  Caren Griffins, MD  Relative Name and Phone Number:  Faye, Sanfilippo   [phone]  Kathryn, Linarez   [phone]    Current Level of Care: Hospital Recommended Level of Care: [place] Prior Approval Number:    Date Approved/Denied:   PASRR Number: [id number]  Discharge Plan: SNF    Current Diagnoses: Patient Active Problem List   Diagnosis Date Noted   Acute lower UTI 07/30/2021   Community acquired pneumonia 07/30/2021   Supratherapeutic INR 07/30/2021   Candidiasis of penis 07/30/2021   Stage 1 skin ulcer of sacral region [facility]) 07/30/2021   Bladder cancer ([place]) 07/30/2021   Atrial fibrillation with RVR ([place]) 07/29/2021    Orientation RESPIRATION BLADDER Height & Weight     Self, Time, Situation, Place  Normal Continent Weight: 145 lb (65.8 kg) Height:  5\' 11"  (180.3 cm)  BEHAVIORAL SYMPTOMS/MOOD NEUROLOGICAL BOWEL NUTRITION STATUS      Continent Diet (Regular diet)  AMBULATORY STATUS COMMUNICATION OF NEEDS Skin   Limited Assist Verbally PU Stage and Appropriate Care PU Stage 1 Dressing:  (PRN dressing change)                     Personal Care Assistance Level of Assistance  Feeding, Dressing, Bathing Bathing Assistance: Limited assistance Feeding assistance: Independent Dressing Assistance: Limited assistance     Functional Limitations Info  Sight, Hearing, Speech Sight Info: Adequate Hearing Info: Impaired Speech Info: Adequate    SPECIAL CARE FACTORS FREQUENCY  PT (By licensed PT), OT (By licensed OT)     PT Frequency: Minimum 5x a week OT  Frequency: Minimum 5x a week            Contractures Contractures Info: Not present    Additional Factors Info  Psychotropic, Code Status, Allergies Code Status Info: DNR Allergies Info: Aspirin   Clindamycin/lincomycin   Other   Sulfa Antibiotics Psychotropic Info: mirtazapine (REMERON) tablet 15 mg         Current Medications (08/01/2021):  This is the current hospital active medication list Current Facility-Administered Medications  Medication Dose Route Frequency Provider Last Rate Last Admin   acetaminophen (TYLENOL) tablet 650 mg  650 mg Oral Q6H PRN Tu, Ching T, DO       atorvastatin (LIPITOR) tablet 10 mg  10 mg Oral QHS Florencia Reasons, MD   10 mg at 07/31/21 2207   azithromycin (ZITHROMAX) 500 mg in sodium chloride 0.9 % 250 mL IVPB  500 mg Intravenous Q24H Tu, Ching T, DO 250 mL/hr at 07/31/21 2301 500 mg at 07/31/21 2301   ceFEPIme (MAXIPIME) 2 g in sodium chloride 0.9 % 100 mL IVPB  2 g Intravenous Q12H Lenis Noon, RPH 200 mL/hr at 08/01/21 1118 2 g at 08/01/21 1118   Chlorhexidine Gluconate Cloth 2 % PADS 6 each  6 each Topical Daily Florencia Reasons, MD   6 each at 08/01/21 1103   clotrimazole (LOTRIMIN) 1 % cream   Topical BID Kommor, Debe Coder, MD   Given at 08/01/21 1103   food thickener (SIMPLYTHICK (NECTAR/LEVEL 2/MILDLY THICK)) 1 packet  1 packet Oral  PRN Florencia Reasons, MD       HYDROcodone-acetaminophen (NORCO/VICODIN) 5-325 MG per tablet 1 tablet  1 tablet Oral Q6H PRN Tu, Ching T, DO   1 tablet at 07/30/21 2203   levothyroxine (SYNTHROID) tablet 50 mcg  50 mcg Oral q morning Florencia Reasons, MD   50 mcg at 08/01/21 6861   liothyronine (CYTOMEL) tablet 5 mcg  5 mcg Oral q morning Florencia Reasons, MD   5 mcg at 08/01/21 1103   melatonin tablet 10 mg  10 mg Oral QHS PRN Florencia Reasons, MD   10 mg at 07/31/21 2306   metoprolol tartrate (LOPRESSOR) tablet 25 mg  25 mg Oral BID Florencia Reasons, MD   25 mg at 07/31/21 0946   mirabegron ER (MYRBETRIQ) tablet 25 mg  25 mg Oral Daily Caren Griffins, MD   25 mg  at 08/01/21 1114   mirtazapine (REMERON) tablet 15 mg  15 mg Oral QHS Florencia Reasons, MD   15 mg at 07/31/21 2207   pantoprazole (PROTONIX) EC tablet 40 mg  40 mg Oral q morning Florencia Reasons, MD   40 mg at 08/01/21 1103   phenazopyridine (PYRIDIUM) tablet 200 mg  200 mg Oral Q8H PRN Florencia Reasons, MD   200 mg at 07/31/21 2022   polyethylene glycol (MIRALAX / GLYCOLAX) packet 17 g  17 g Oral q morning Florencia Reasons, MD   17 g at 08/01/21 1103   tamsulosin (FLOMAX) capsule 0.4 mg  0.4 mg Oral q morning Florencia Reasons, MD   0.4 mg at 08/01/21 1103   traMADol (ULTRAM) tablet 25 mg  25 mg Oral Q6H PRN Caren Griffins, MD   25 mg at 08/01/21 1321   Warfarin - Pharmacist Dosing Inpatient   Does not apply [id number] Lenis Noon, [facility]         Discharge Medications: Please see discharge summary for a list of discharge medications.  Relevant Imaging Results:  Relevant Lab Results:   Additional Information SSN [id number]  Ross Ludwig, LCSW

## 2021-08-01 NOTE — Progress Notes (Signed)
ANTICOAGULATION CONSULT NOTE - Initial Consult  Pharmacy Consult for warfarin Indication: atrial fibrillation  Allergies  Allergen Reactions   Aspirin Other (See Comments)    Unknown reaction - listed on [facility] 07/30/21   Clindamycin/Lincomycin Other (See Comments)    Unknown reaction - listed on [facility] 07/30/21   Other Other (See Comments)    Unknown reaction to opioids - listed on [facility] 07/30/21   Sulfa Antibiotics Other (See Comments)    Unknown reaction - listed on [facility] 07/30/21    Patient Measurements: Height: 5\' 11"  (180.3 cm) Weight: 65.8 kg (145 lb) IBW/kg (Calculated) : 75.3  Vital Signs: Temp: 97.9 F (36.6 C) (01/29 0427) Temp Source: Oral (01/29 0427) BP: 129/71 (01/29 0427) Pulse Rate: 76 (01/29 0427)  Labs: Recent Labs    07/29/21 2000 07/30/21 0650 07/31/21 0325 08/01/21 0316  HGB 13.4 12.5* 11.6* 10.7*  HCT 40.2 37.3* 36.4* 33.0*  PLT 204 192 188 175  LABPROT 47.2* 46.5* 24.8* 19.4*  INR 5.1* 5.0* 2.2* 1.6*  CREATININE 1.11  --  0.71 0.79     Estimated Creatinine Clearance: 48 mL/min (by C-G formula based on SCr of 0.79 mg/dL).   Medical History: Past Medical History:  Diagnosis Date   Atrial fibrillation ([place])    Hyperlipidemia    Hypertension    Thyrotoxicosis     Medications: Pt prescribed warfarin PTA for atrial fibrillation -Per med rec, pt was self-administering medications prior to admission at ALF. Unable to confirm time/date of last warfarin dose PTA. INR was supratherapeutic on admission -Most recent reported regimen from ALF: 2 mg Sun, Tues; 3 mg all other days. Unable to confirm compliance.   Significant Events: -INR 5.1 on admission; s/p vitamin K 2.5 mg PO on 1/27  Assessment: Pt is a 88 year old male presenting from ALF with UTI. Pt prescribed warfarin prior to admission for atrial fibrillation. No history of CVA noted. INR now therapeutic s/p vitamin K, pharmacy consulted to resume warfarin dosing inpatient. Note plan to discharge to  SNF.  Today, 08/01/21 INR 1.6 is subtherapeutic s/p vitamin K and holding warfarin.  CBC: Hgb slightly low and decreased; Plt WNL Diet: Dysphagia 1. 25% meal intake charted. Albumin low Cardiac rhythm: sinus brady No major DDI. Pt is on antibiotics which may enhance effects of warfarin. Confirmed with MD - no bridge necessary at this time.  Goal of Therapy:  INR 2-3 Monitor platelets by anticoagulation protocol: Yes   Plan:  Warfarin 3 mg PO again today since received vitamin K CBC with AM labs tomorrow; INR daily  Since unable to confirm warfarin regimen/dose/compliance PTA, will need close follow up at discharge. Would recommend INR check within 72 hours of discharge to further guide dose adjustments.   Lenis Noon, PharmD 08/01/2021,11:00 AM

## 2021-08-01 NOTE — Plan of Care (Signed)

## 2021-08-02 LAB — BASIC METABOLIC PANEL
Anion gap: 5 (ref 5–15)
BUN: 17 mg/dL (ref 8–23)
CO2: 27 mmol/L (ref 22–32)
Calcium: 9.1 mg/dL (ref 8.9–10.3)
Chloride: 106 mmol/L (ref 98–111)
Creatinine, Ser: 0.84 mg/dL (ref 0.61–1.24)
GFR, Estimated: >60 mL/min (ref >60)
Glucose, Bld: 96 mg/dL (ref 70–99)
Potassium: 3.6 mmol/L (ref 3.5–5.1)
Sodium: 138 mmol/L (ref 135–145)

## 2021-08-02 LAB — FOLATE: Folate: 10.9 ng/mL (ref >5.9)

## 2021-08-02 LAB — CBC
HCT: 32.7 % — ABNORMAL LOW (ref 39.0–52.0)
Hemoglobin: 10.6 g/dL — ABNORMAL LOW (ref 13.0–17.0)
MCH: 36.2 pg — ABNORMAL HIGH (ref 26.0–34.0)
MCHC: 32.4 g/dL (ref 30.0–36.0)
MCV: 111.6 fL — ABNORMAL HIGH (ref 80.0–100.0)
Platelets: 163 10*3/uL (ref 150–400)
RBC: 2.93 MIL/uL — ABNORMAL LOW (ref 4.22–5.81)
RDW: 13.1 % (ref 11.5–15.5)
WBC: 9.5 10*3/uL (ref 4.0–10.5)
nRBC: 0 % (ref 0.0–0.2)

## 2021-08-02 LAB — RETICULOCYTES
Immature Retic Fract: 18.2 % — ABNORMAL HIGH (ref 2.3–15.9)
RBC.: 2.91 MIL/uL — ABNORMAL LOW (ref 4.22–5.81)
Retic Count, Absolute: 42.5 10*3/uL (ref 19.0–186.0)
Retic Ct Pct: 1.5 % (ref 0.4–3.1)

## 2021-08-02 LAB — IRON AND TIBC
Iron: 38 ug/dL — ABNORMAL LOW (ref 45–182)
Saturation Ratios: 21 % (ref 17.9–39.5)
TIBC: 180 ug/dL — ABNORMAL LOW (ref 250–450)
UIBC: 142 ug/dL

## 2021-08-02 LAB — PROTIME-INR
INR: 2.1 — ABNORMAL HIGH (ref 0.8–1.2)
Prothrombin Time: 23.9 seconds — ABNORMAL HIGH (ref 11.4–15.2)

## 2021-08-02 LAB — FERRITIN: Ferritin: 127 ng/mL (ref 24–336)

## 2021-08-02 LAB — VITAMIN B12: Vitamin B-12: 753 pg/mL (ref 180–914)

## 2021-08-02 MED ORDER — WARFARIN SODIUM 2 MG PO TABS
2.0000 mg | ORAL_TABLET | Freq: Once | ORAL | Status: AC
Start: 2021-08-02 — End: 2021-08-02
  Administered 2021-08-02: 2 mg via ORAL
  Filled 2021-08-02: qty 1

## 2021-08-02 NOTE — Progress Notes (Addendum)
PROGRESS NOTE    Craig Snow  QQP:[id number] DOB: 1920-08-08 DOA: 07/29/2021 PCP: Pcp, No    Brief Narrative:  This 88 year old male with PMH significant of A. fib on Coumadin, remote history of palate cancer, HTN, HLD, recent bladder cancer with chronic Foley who comes in from ALF with increasing penile pain.  He was recently diagnosed with bladder cancer about 4 to 5 months ago, underwent TURP, has chronic indwelling Foley catheter and currently undergoing BCG therapy. He has been having dysuria and lower abdominal pain.  He is found to have bilateral pneumonia possible aspiration,  started on antibiotics.  Assessment & Plan:   Principal Problem:   Atrial fibrillation with RVR ([place]) Active Problems:   Acute lower UTI   Community acquired pneumonia   Supratherapeutic INR   Candidiasis of penis   Stage 1 skin ulcer of sacral region [facility])   Bladder cancer ([place])  UTI due to chronic indwelling urinary catheter: History of bladder cancer: He failed outpatient therapy with Macrobid, presented with dysuria and abdominal pain. Patient is started on cefepime, urine culture contaminated. He was found to have genital erythema with concern for Fournier's gangrene, urology was consulted. This is unlikely and physical exam and imaging most likely suggest superficial infection such as fungal, treated with topical clotrimazole.  We will continue IV antibiotics for now, transition to p.o. antibiotics on discharge to SNF.  Follow-up outpatient with Dr. Claudia Desanctis next week. Patient also reports significant bladder cramping, discussed with Dr. Alinda Money,  started patient on Myrbetriq.  If it does not help could potentially try to replace the Foley catheter.  Bilateral pneumonia, possible aspiration: Chest x-ray on admission showed patchy left greater than right basilar opacities concerning for pneumonia. He also complains of cough but no significant other respiratory symptoms. Continue IV cefepime and  Zithromax.  Penile candidiasis: Continue clotrimazole, rash is improving.  Chronic atrial fibrillation: Heart rate is well controlled.  Continue metoprolol. Continue Coumadin as per pharmacy. INR was supratherapeutic,  now improved.  Hypothyroidism: Continue levothyroxine and liothyronine.  Hyperlipidemia: Continue statins.  Hypomagnesemia: replaced and improved.  Coccyx pressure injury, stage I, POA Pressure Injury 07/30/21 Coccyx Mid Stage 1 -  Intact skin with non-blanchable redness of a localized area usually over a bony prominence. (Active)  07/30/21 2045  Location: Coccyx  Location Orientation: Mid  Staging: Stage 1 -  Intact skin with non-blanchable redness of a localized area usually over a bony prominence.  Wound Description (Comments):   Present on Admission: Yes   DVT prophylaxis: SCDs Code Status: DNR Family Communication: No family at bed side Disposition Plan:   Status is: Inpatient  Remains inpatient appropriate because: Persistent symptoms.  Consultants:  Urology Wound care  Procedures: None Antimicrobials:  Anti-infectives (From admission, onward)    Start     Dose/Rate Route Frequency Ordered Stop   07/31/21 2200  ceFEPIme (MAXIPIME) 1 g in sodium chloride 0.9 % 100 mL IVPB  Status:  Discontinued        1 g 200 mL/hr over 30 Minutes Intravenous Every 12 hours 07/31/21 1147 07/31/21 1150   07/31/21 2200  ceFEPIme (MAXIPIME) 2 g in sodium chloride 0.9 % 100 mL IVPB        2 g 200 mL/hr over 30 Minutes Intravenous Every 12 hours 07/31/21 1150     07/30/21 2200  azithromycin (ZITHROMAX) 500 mg in sodium chloride 0.9 % 250 mL IVPB        500 mg 250 mL/hr over 60 Minutes Intravenous  Every 24 hours 07/29/21 2156     07/30/21 0400  ceFEPIme (MAXIPIME) 2 g in sodium chloride 0.9 % 100 mL IVPB  Status:  Discontinued        2 g 200 mL/hr over 30 Minutes Intravenous Every 24 hours 07/29/21 2313 07/31/21 1147   07/29/21 2015  cefTRIAXone (ROCEPHIN) 1 g  in sodium chloride 0.9 % 100 mL IVPB        1 g 200 mL/hr over 30 Minutes Intravenous  Once 07/29/21 2000 07/29/21 2039   07/29/21 2015  azithromycin (ZITHROMAX) 500 mg in sodium chloride 0.9 % 250 mL IVPB        500 mg 250 mL/hr over 60 Minutes Intravenous  Once 07/29/21 2000 07/29/21 2114       Subjective: Patient was seen and examined at bedside.  Overnight events noted.  Patient is very hard of hearing.  Patient communicates by writing on the board.  He is alert and oriented following full commands.  Objective: Vitals:   08/01/21 2011 08/01/21 2235 08/02/21 0550 08/02/21 1018  BP: (!) 131/57 130/63 (!) 115/53 (!) 112/52  Pulse: 96 90 (!) 53 64  Resp: 16 18 18    Temp: 98.7 F (37.1 C)  98.5 F (36.9 C)   TempSrc: Oral  Oral   SpO2: 95%  97%   Weight:      Height:        Intake/Output Summary (Last 24 hours) at 08/02/2021 1331 Last data filed at 08/02/2021 0900 Gross per 24 hour  Intake 1358.88 ml  Output 1125 ml  Net 233.88 ml   Filed Weights   07/29/21 1856  Weight: 65.8 kg    Examination:  General exam: Appears comfortable, chronically ill looking, deconditioned, not in any distress. Respiratory system: Clear to auscultation bilaterally, respiratory effort normal, RR 15 Cardiovascular system: S1 & S2 heard, Irregular rhythm, no murmur.   Gastrointestinal system: Abdomen is soft, nontender, nondistended, BS+ Central nervous system: Alert and oriented x 2. No focal neurological deficits. Extremities: No edema, no cyanosis, no clubbing. Skin: No rashes, lesions or ulcers Psychiatry: . Mood & affect appropriate.     Data Reviewed: I have personally reviewed following labs and imaging studies  CBC: Recent Labs  Lab 07/29/21 2000 07/30/21 0650 07/31/21 0325 08/01/21 0316 08/02/21 0328  WBC 14.7* 10.0 9.2 9.8 9.5  NEUTROABS 11.8*  --  5.8  --   --   HGB 13.4 12.5* 11.6* 10.7* 10.6*  HCT 40.2 37.3* 36.4* 33.0* 32.7*  MCV 108.9* 110.0* 112.3* 111.1* 111.6*   PLT 204 192 188 175 157   Basic Metabolic Panel: Recent Labs  Lab 07/29/21 2000 07/31/21 0325 08/01/21 0316 08/02/21 0328  NA 137 140 139 138  K 3.9 3.6 3.8 3.6  CL 100 108 109 106  CO2 26 27 27 27   GLUCOSE 152* 78 90 96  BUN 41* 22 22 17   CREATININE 1.11 0.71 0.79 0.84  CALCIUM 11.0* 9.6 9.1 9.1  MG  --  1.6* 1.8  --    GFR: Estimated Creatinine Clearance: 45.7 mL/min (by C-G formula based on SCr of 0.84 mg/dL). Liver Function Tests: Recent Labs  Lab 07/29/21 2000 08/01/21 0316  AST 31 26  ALT 20 17  ALKPHOS 79 53  BILITOT 1.2 0.8  PROT 7.6 5.3*  ALBUMIN 3.9 2.8*   No results for input(s): LIPASE, AMYLASE in the last 168 hours. No results for input(s): AMMONIA in the last 168 hours. Coagulation Profile: Recent Labs  Lab  07/29/21 2000 07/30/21 0650 07/31/21 0325 08/01/21 0316 08/02/21 0328  INR 5.1* 5.0* 2.2* 1.6* 2.1*   Cardiac Enzymes: No results for input(s): CKTOTAL, CKMB, CKMBINDEX, TROPONINI in the last 168 hours. BNP (last 3 results) No results for input(s): PROBNP in the last 8760 hours. HbA1C: No results for input(s): HGBA1C in the last 72 hours. CBG: No results for input(s): GLUCAP in the last 168 hours. Lipid Profile: No results for input(s): CHOL, HDL, LDLCALC, TRIG, CHOLHDL, LDLDIRECT in the last 72 hours. Thyroid Function Tests: No results for input(s): TSH, T4TOTAL, FREET4, T3FREE, THYROIDAB in the last 72 hours. Anemia Panel: Recent Labs    08/02/21 0328  VITAMINB12 753  FOLATE 10.9  FERRITIN 127  TIBC 180*  IRON 38*  RETICCTPCT 1.5   Sepsis Labs: Recent Labs  Lab 07/29/21 2000 07/31/21 0325 08/01/21 0316  PROCALCITON  --  0.24 0.16  LATICACIDVEN 1.8  --   --     Recent Results (from the past 240 hour(s))  Culture, blood (Routine x 2)     Status: None (Preliminary result)   Collection Time: 07/29/21  7:55 PM   Specimen: BLOOD  Result Value Ref Range Status   Specimen Description   Final    BLOOD BLOOD LEFT  FOREARM Performed at [facility] [address] [place], [place] [address]    Special Requests   Final    BOTTLES DRAWN AEROBIC AND ANAEROBIC Blood Culture results may not be optimal due to an excessive volume of blood received in culture bottles Performed at [facility] [address] [place], [place] [address]    Culture   Final    NO GROWTH 4 DAYS Performed at [facility], [place] [address] [place], [place] [address]    Report Status PENDING  Incomplete  Culture, blood (Routine x 2)     Status: None (Preliminary result)   Collection Time: 07/29/21  8:00 PM   Specimen: BLOOD  Result Value Ref Range Status   Specimen Description   Final    BLOOD LEFT ANTECUBITAL Performed at [facility] [address] [place], [place] [address]    Special Requests   Final    BOTTLES DRAWN AEROBIC AND ANAEROBIC Blood Culture adequate volume Performed at [facility] [address] [place], [place] [address]    Culture   Final    NO GROWTH 4 DAYS Performed at [facility], [place] [address], [place], [place] [address]    Report Status PENDING  Incomplete  Urine Culture     Status: Abnormal   Collection Time: 07/29/21  8:14 PM   Specimen: Urine, Clean Catch  Result Value Ref Range Status   Specimen Description   Final    URINE, CLEAN CATCH Performed at [facility], [place] [address] [place], [place] [address]    Special Requests   Final    NONE Performed at [facility], [place] [address], [place],  [address]    Culture MULTIPLE SPECIES PRESENT, SUGGEST RECOLLECTION (A)  Final   Report Status 07/31/2021 FINAL  Final  Resp Panel by RT-PCR (Flu A&B, Covid) Urine, Clean Catch     Status: None   Collection Time: 07/29/21  9:15 PM   Specimen: Urine, Clean Catch; Nasopharyngeal(NP) swabs in vial transport medium  Result Value Ref Range Status   SARS Coronavirus  2 by RT PCR NEGATIVE NEGATIVE Final    Comment: (NOTE) SARS-CoV-2  target nucleic acids are NOT DETECTED.  The SARS-CoV-2 RNA is generally detectable in upper respiratory specimens during the acute phase of infection. The lowest concentration of SARS-CoV-2 viral copies this assay can detect is 138 copies/mL. A negative result does not preclude SARS-Cov-2 infection and should not be used as the sole basis for treatment or other patient management decisions. A negative result may occur with  improper specimen collection/handling, submission of specimen other than nasopharyngeal swab, presence of viral mutation(s) within the areas targeted by this assay, and inadequate number of viral copies(<138 copies/mL). A negative result must be combined with clinical observations, patient history, and epidemiological information. The expected result is Negative.  Fact Sheet for Patients:  EntrepreneurPulse.com.au  Fact Sheet for Healthcare Providers:  IncredibleEmployment.be  This test is no t yet approved or cleared by the [place] FDA and  has been authorized for detection and/or diagnosis of SARS-CoV-2 by FDA under an Emergency Use Authorization (EUA). This EUA will remain  in effect (meaning this test can be used) for the duration of the COVID-19 declaration under Section 564(b)(1) of the Act, 21 U.S.C.section 360bbb-3(b)(1), unless the authorization is terminated  or revoked sooner.       Influenza A by PCR NEGATIVE NEGATIVE Final   Influenza B by PCR NEGATIVE NEGATIVE Final    Comment: (NOTE) The Xpert Xpress SARS-CoV-2/FLU/RSV plus assay is intended as an aid in the diagnosis of influenza from Nasopharyngeal swab specimens and should not be used as a sole basis for treatment. Nasal washings and aspirates are unacceptable for Xpert Xpress SARS-CoV-2/FLU/RSV testing.  Fact Sheet for Patients: EntrepreneurPulse.com.au  Fact  Sheet for Healthcare Providers: IncredibleEmployment.be  This test is not yet approved or cleared by the [place] FDA and has been authorized for detection and/or diagnosis of SARS-CoV-2 by FDA under an Emergency Use Authorization (EUA). This EUA will remain in effect (meaning this test can be used) for the duration of the COVID-19 declaration under Section 564(b)(1) of the Act, 21 U.S.C. section 360bbb-3(b)(1), unless the authorization is terminated or revoked.  Performed at [facility], [place] [address], [place], [place] [address]     Radiology Studies: No results found.  Scheduled Meds:  atorvastatin  10 mg Oral QHS   Chlorhexidine Gluconate Cloth  6 each Topical Daily   clotrimazole   Topical BID   levothyroxine  50 mcg Oral q morning   liothyronine  5 mcg Oral q morning   metoprolol tartrate  25 mg Oral BID   mirabegron ER  25 mg Oral Daily   mirtazapine  15 mg Oral QHS   pantoprazole  40 mg Oral q morning   polyethylene glycol  17 g Oral q morning   tamsulosin  0.4 mg Oral q morning   warfarin  2 mg Oral ONCE-1600   Warfarin - Pharmacist Dosing Inpatient   Does not apply q1600   Continuous Infusions:  azithromycin 500 mg (08/01/21 2332)   ceFEPime (MAXIPIME) IV 2 g (08/02/21 1059)     LOS: 4 days    Time spent: 3 mins    Shawntelle Ungar, MD [facility]   If 7PM-7AM, please contact night-coverage

## 2021-08-02 NOTE — Progress Notes (Signed)
Speech Language Pathology Treatment: Dysphagia  Patient Details Name: Craig Snow MRN: [id number] DOB: 1921/04/11 Today's Date: 08/02/2021 Time: 5670-1410 SLP Time Calculation (min) (ACUTE ONLY): 30 min  Assessment / Plan / Recommendation Clinical Impression  Patient seen by SLP to address dysphagia goals, educate and discuss potential MBS. Patient was alert, breakfast tray in front of him. As he is very HOH, SLP wrote all statements and questions on dry erase board. Patient then able to verbally respond. He reported that his diet is mainly liquid nutrition like milk, ensure, etc and that he avoids citrus like orange juice because it is "too spicy". He did report that his overall PO intake is "probably not very good". 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 Patient consumed a couple sips (sippy cup) of milk and did not exhibit any overt s/s aspiration or penetration. During prolonged discussion regarding nature and reasoning behind x-ray swallow test (MBS), patient politely declined saying "I have a lot of other things going on" and that his main issue was his penile/bladder pain. MD entered room at end of session and patient discussed medical issues with him. As patient appears to be understanding of his own medical issues and needs, and dysphagia appears to be chronic, SLP in agreement with patient's decision to decline MBS at this time. SLP will plan to follow up at least one more time to ensure patient is on LRD and to complete any more swallow safety education.   HPI HPI: pt isa 88 yo male adm to [facility] with penile discomfort - found to have low BP and CXR indicating pna. 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 Pt recently moved to [place] and stays at [place].  Swallow eval ordered. He denies having prior swallow evaluation.      SLP Plan   Continue with current plan of care      Recommendations for follow up therapy are one component of a multi-disciplinary discharge planning process, led by the attending physician.  Recommendations may be updated based on patient status, additional functional criteria and insurance authorization.    Recommendations  Diet recommendations: Dysphagia 1 (puree);Thin liquid;Nectar-thick liquid Liquids provided via: Cup Medication Administration: Whole meds with liquid Supervision: Patient able to self feed;Intermittent supervision to cue for compensatory strategies Compensations: Slow rate;Small sips/bites Postural Changes and/or Swallow Maneuvers: Seated upright 90 degrees;Upright 30-60 min after meal                Oral Care Recommendations: Oral care before and after PO Follow Up Recommendations: Other (comment) (TBD) Assistance recommended at discharge: Intermittent Supervision/Assistance SLP Visit Diagnosis: Dysphagia, unspecified (R13.10);Dysphagia, oral phase (R13.11) Plan: Continue with current plan of care          Sonia Baller, MA, CCC-SLP Speech Therapy

## 2021-08-02 NOTE — Plan of Care (Signed)
°  Problem: Education: Goal: Knowledge of General Education information will improve Description: Including pain rating scale, medication(s)/side effects and non-pharmacologic comfort measures Outcome: Progressing   Problem: Clinical Measurements: Goal: Diagnostic test results will improve Outcome: Progressing   Problem: Pain Managment: Goal: General experience of comfort will improve Outcome: Progressing   Problem: Safety: Goal: Ability to remain free from injury will improve Outcome: Progressing

## 2021-08-02 NOTE — Progress Notes (Signed)
ANTICOAGULATION CONSULT NOTE  Pharmacy Consult for warfarin Indication: atrial fibrillation  Allergies  Allergen Reactions   Aspirin Other (See Comments)    Unknown reaction - listed on [facility] 07/30/21   Clindamycin/Lincomycin Other (See Comments)    Unknown reaction - listed on [facility] 07/30/21   Other Other (See Comments)    Unknown reaction to opioids - listed on [facility] 07/30/21   Sulfa Antibiotics Other (See Comments)    Unknown reaction - listed on [facility] 07/30/21    Patient Measurements: Height: 5\' 11"  (180.3 cm) Weight: 65.8 kg (145 lb) IBW/kg (Calculated) : 75.3  Vital Signs: Temp: 98.5 F (36.9 C) (01/30 0550) Temp Source: Oral (01/30 0550) BP: 115/53 (01/30 0550) Pulse Rate: 53 (01/30 0550)  Labs: Recent Labs    07/31/21 0325 08/01/21 0316 08/02/21 0328  HGB 11.6* 10.7* 10.6*  HCT 36.4* 33.0* 32.7*  PLT 188 175 163  LABPROT 24.8* 19.4* 23.9*  INR 2.2* 1.6* 2.1*  CREATININE 0.71 0.79 0.84     Estimated Creatinine Clearance: 45.7 mL/min (by C-G formula based on SCr of 0.84 mg/dL).   Medical History: Past Medical History:  Diagnosis Date   Atrial fibrillation ([place])    Hyperlipidemia    Hypertension    Thyrotoxicosis     Medications: Pt prescribed warfarin PTA for atrial fibrillation -Per med rec, pt was self-administering medications prior to admission at ALF. Unable to confirm time/date of last warfarin dose PTA. INR was supratherapeutic on admission -Most recent reported regimen from ALF: 2 mg Sun, Tues; 3 mg all other days. Unable to confirm compliance.   Significant Events: -INR 5.1 on admission; s/p vitamin K 2.5 mg PO on 1/27  Assessment: Pt is a 88 year old male presenting from ALF with UTI. Pt prescribed warfarin prior to admission for atrial fibrillation. No history of CVA noted. INR now therapeutic s/p vitamin K, pharmacy consulted to resume warfarin dosing inpatient. Note plan to discharge to SNF.  Today, 08/02/21 INR now therapeutic after resuming  warfarin x 2 doses  CBC: Hgb low but stable from yesterday; Plt WNL Diet: Dysphagia 1; <25% meal intake charted. Albumin low No major DDI. Pt is on antibiotics though, which may enhance effects of warfarin.  Goal of Therapy:  INR 2-3 Monitor platelets by anticoagulation protocol: Yes   Plan:  Warfarin 2 mg PO tonight INR daily CBC at least q72 hr while on warfarin inpatient  Since unable to confirm warfarin regimen/dose/compliance PTA, will need close follow up at discharge. At this point would recommend continuing home warfarin regimen and INR check within 72 hours of discharge to further guide dose adjustments.   Chrys Landgrebe A, PharmD 08/02/2021,9:17 AM

## 2021-08-02 NOTE — Progress Notes (Signed)
Physical Therapy Treatment Patient Details Name: Craig Snow Reason MRN: [id number] DOB: 22-Nov-1920 Today's Date: 08/02/2021   History of Present Illness Pt is a 88 y.o.  male sent from ALF to [facility] ED due to increase gential pain x2wks,UTI due to chronic indwelling urinary catheter- indwelling Foley last changed on 1/16, CXR indicated possible pneumonia. PMH significant for bladder cancer and chronic urinary retention, a-fib, HTN, and HLD,    PT Comments    Pt participated well. He tolerated activity well. He is a bit anxious about falling-prefers to have 2 people around him. Will need SNF for rehab.   Recommendations for follow up therapy are one component of a multi-disciplinary discharge planning process, led by the attending physician.  Recommendations may be updated based on patient status, additional functional criteria and insurance authorization.  Follow Up Recommendations  Skilled nursing-short term rehab (<3 hours/day)     Assistance Recommended at Discharge Frequent or constant Supervision/Assistance  Patient can return home with the following A little help with walking and/or transfers;A little help with bathing/dressing/bathroom;Assist for transportation;Help with stairs or ramp for entrance;Assistance with cooking/housework   Equipment Recommendations  None recommended by PT    Recommendations for Other Services       Precautions / Restrictions Precautions Precautions: Fall Precaution Comments: HOH-communicates with white board; severe bladder spams intermittently Restrictions Weight Bearing Restrictions: No     Mobility  Bed Mobility Overal bed mobility: Needs Assistance Bed Mobility: Supine to Sit     Supine to sit: Min guard, HOB elevated     General bed mobility comments: Increased time. Min guard for safety    Transfers Overall transfer level: Needs assistance Equipment used: Rolling walker (2 wheels) Transfers: Sit to/from Stand Sit to Stand: Min assist            General transfer comment: Assist to power up, stabilize, control descent. Cues for safety.    Ambulation/Gait Ambulation/Gait assistance: Min assist, +2 safety/equipment Gait Distance (Feet): 20 Feet Assistive device: Rolling walker (2 wheels) Gait Pattern/deviations: Step-through pattern, Decreased stride length       General Gait Details: Assist to stabilize and manage RW. +2 for safety at pt's request (son was present and stood on other side). Remained in room.   Stairs             Wheelchair Mobility    Modified Rankin (Stroke Patients Only)       Balance Overall balance assessment: Needs assistance, History of Falls         Standing balance support: Bilateral upper extremity supported, During functional activity, Reliant on assistive device for balance Standing balance-Leahy Scale: Poor                              Cognition Arousal/Alertness: Awake/alert Behavior During Therapy: WFL for tasks assessed/performed Overall Cognitive Status: Within Functional Limits for tasks assessed                                          Exercises      General Comments        Pertinent Vitals/Pain Pain Assessment Pain Assessment: Faces Faces Pain Scale: Hurts even more Pain Location: genitals Pain Descriptors / Indicators: Discomfort, Grimacing Pain Intervention(s): Limited activity within patient's tolerance, Monitored during session, Repositioned    Home Living  Prior Function            PT Goals (current goals can now be found in the care plan section) Progress towards PT goals: Progressing toward goals    Frequency    Min 3X/week      PT Plan Current plan remains appropriate    Co-evaluation              AM-PAC PT "6 Clicks" Mobility   Outcome Measure  Help needed turning from your back to your side while in a flat bed without using bedrails?: A Little Help  needed moving from lying on your back to sitting on the side of a flat bed without using bedrails?: A Little Help needed moving to and from a bed to a chair (including a wheelchair)?: A Little Help needed standing up from a chair using your arms (e.g., wheelchair or bedside chair)?: A Little Help needed to walk in hospital room?: A Little Help needed climbing 3-5 steps with a railing? : A Lot 6 Click Score: 17    End of Session Equipment Utilized During Treatment: Gait belt Activity Tolerance: Patient tolerated treatment well Patient left: in chair;with call bell/phone within reach;with chair alarm set;with family/visitor present   PT Visit Diagnosis: Unsteadiness on feet (R26.81);Muscle weakness (generalized) (M62.81);Difficulty in walking, not elsewhere classified (R26.2);Pain     Time: 0175-1025 PT Time Calculation (min) (ACUTE ONLY): 23 min  Charges:  $Gait Training: 23-37 mins                         Doreatha Massed, PT Acute Rehabilitation  Office: [phone] Pager: [phone]

## 2021-08-02 NOTE — Progress Notes (Addendum)
°   08/02/21 1642  ECG Monitoring  CV Strip Heart Rate 113  Cardiac Rhythm Atrial fibrillation;Other (Comment) (w/RVR x approx 3 min;  reported to [place], [place] @ [phone].    NGP   CMT)  Provider Notification  Provider Name/Title Shawna Clamp MD  Date Provider Notified 08/02/21  Time Provider Notified 1642  Notification Type Page  Notification Reason Other (Comment) (Afib with RVR)  Date Critical Result Received 08/02/21  Time Critical Result Received 1642  Provider response Other (Comment) (Awaiting orders)  Date of Provider Response 08/02/21  Time of Provider Response [id number]   PZZCK sign assessed and within normal range. Spoke with MB via phone will continue with plan of care and excalate intervention  per protocol. Charge Nurse, [place] notified.

## 2021-08-03 ENCOUNTER — Inpatient Hospital Stay (HOSPITAL_COMMUNITY): Payer: Medicare Other

## 2021-08-03 LAB — CULTURE, BLOOD (ROUTINE X 2)
Culture: NO GROWTH
Culture: NO GROWTH
Special Requests: ADEQUATE

## 2021-08-03 LAB — CBC
HCT: 33.2 % — ABNORMAL LOW (ref 39.0–52.0)
Hemoglobin: 10.9 g/dL — ABNORMAL LOW (ref 13.0–17.0)
MCH: 36.5 pg — ABNORMAL HIGH (ref 26.0–34.0)
MCHC: 32.8 g/dL (ref 30.0–36.0)
MCV: 111 fL — ABNORMAL HIGH (ref 80.0–100.0)
Platelets: 157 10*3/uL (ref 150–400)
RBC: 2.99 MIL/uL — ABNORMAL LOW (ref 4.22–5.81)
RDW: 13 % (ref 11.5–15.5)
WBC: 9.1 10*3/uL (ref 4.0–10.5)
nRBC: 0 % (ref 0.0–0.2)

## 2021-08-03 LAB — BASIC METABOLIC PANEL
Anion gap: 5 (ref 5–15)
BUN: 19 mg/dL (ref 8–23)
CO2: 25 mmol/L (ref 22–32)
Calcium: 9.3 mg/dL (ref 8.9–10.3)
Chloride: 108 mmol/L (ref 98–111)
Creatinine, Ser: 0.92 mg/dL (ref 0.61–1.24)
GFR, Estimated: >60 mL/min (ref >60)
Glucose, Bld: 97 mg/dL (ref 70–99)
Potassium: 4 mmol/L (ref 3.5–5.1)
Sodium: 138 mmol/L (ref 135–145)

## 2021-08-03 LAB — MAGNESIUM: Magnesium: 1.7 mg/dL (ref 1.7–2.4)

## 2021-08-03 LAB — PROTIME-INR
INR: 2.9 — ABNORMAL HIGH (ref 0.8–1.2)
Prothrombin Time: 30.1 seconds — ABNORMAL HIGH (ref 11.4–15.2)

## 2021-08-03 LAB — PHOSPHORUS: Phosphorus: 2.7 mg/dL (ref 2.5–4.6)

## 2021-08-03 MED ORDER — WARFARIN SODIUM 1 MG PO TABS
1.0000 mg | ORAL_TABLET | Freq: Once | ORAL | Status: AC
  Administered 2021-08-03: 1 mg via ORAL
  Filled 2021-08-03: qty 1

## 2021-08-03 NOTE — Progress Notes (Signed)
PROGRESS NOTE    Craig Snow  UMP:[id number] DOB: 06/12/1921 DOA: 07/29/2021 PCP: Pcp, No    Brief Narrative:  This 88 year old male with PMH significant of A. fib on Coumadin, remote history of palate cancer, HTN, HLD, recent bladder cancer with chronic Foley who comes in from ALF with increasing penile pain.  He was recently diagnosed with bladder cancer about 4 to 5 months ago, underwent TURP, has chronic indwelling Foley catheter and currently undergoing BCG therapy. He has been having dysuria and lower abdominal pain.  He is found to have bilateral pneumonia possible aspiration,  started on antibiotics.  Assessment & Plan:   Principal Problem:   Atrial fibrillation with RVR ([place]) Active Problems:   Acute lower UTI   Community acquired pneumonia   Supratherapeutic INR   Candidiasis of penis   Stage 1 skin ulcer of sacral region [facility])   Bladder cancer ([place])  UTI due to chronic indwelling urinary catheter: History of bladder cancer: He failed outpatient therapy with Macrobid, presented with dysuria and abdominal pain. Patient is started on cefepime, urine culture contaminated. He was found to have genital erythema with concern for Fournier's gangrene, urology was consulted. This is unlikely and physical exam and imaging most likely suggest superficial infection such as fungal, treated with topical clotrimazole.  We will discontinue antibiotics since patient does have chronic Foley catheter.  Follow-up outpatient with Dr. Claudia Desanctis next week. Patient also reports significant bladder cramping, discussed with Dr. Alinda Money,  started patient on Myrbetriq.  If it does not help could potentially try to replace the Foley catheter. Cramping improved.  Bilateral pneumonia, possible aspiration: Chest x-ray on admission showed patchy left greater than right basilar opacities concerning for pneumonia. He also complains of cough but no significant other respiratory symptoms. He has completed cefepime  and Zithromax for 5 days.  Penile candidiasis: Continue clotrimazole, rash is improving.  Chronic atrial fibrillation: Heart rate is well controlled.  Continue metoprolol. Continue Coumadin as per pharmacy. INR was supratherapeutic,  now improved.  Hypothyroidism: Continue levothyroxine and liothyronine.  Hyperlipidemia: Continue statins.  Hypomagnesemia: replaced and improved.  Coccyx pressure injury, stage I, POA Pressure Injury 07/30/21 Coccyx Mid Stage 1 -  Intact skin with non-blanchable redness of a localized area usually over a bony prominence. (Active)  07/30/21 2045  Location: Coccyx  Location Orientation: Mid  Staging: Stage 1 -  Intact skin with non-blanchable redness of a localized area usually over a bony prominence.  Wound Description (Comments):   Present on Admission: Yes   DVT prophylaxis: SCDs Code Status: DNR Family Communication: No family at bed side Disposition Plan:   Status is: Inpatient  Remains inpatient appropriate because: Persistent symptoms.  Consultants:  Urology Wound care  Procedures: None Antimicrobials:  Anti-infectives (From admission, onward)    Start     Dose/Rate Route Frequency Ordered Stop   07/31/21 2200  ceFEPIme (MAXIPIME) 1 g in sodium chloride 0.9 % 100 mL IVPB  Status:  Discontinued        1 g 200 mL/hr over 30 Minutes Intravenous Every 12 hours 07/31/21 1147 07/31/21 1150   07/31/21 2200  ceFEPIme (MAXIPIME) 2 g in sodium chloride 0.9 % 100 mL IVPB  Status:  Discontinued        2 g 200 mL/hr over 30 Minutes Intravenous Every 12 hours 07/31/21 1150 08/03/21 0712   07/30/21 2200  azithromycin (ZITHROMAX) 500 mg in sodium chloride 0.9 % 250 mL IVPB  Status:  Discontinued  500 mg 250 mL/hr over 60 Minutes Intravenous Every 24 hours 07/29/21 2156 08/03/21 0712   07/30/21 0400  ceFEPIme (MAXIPIME) 2 g in sodium chloride 0.9 % 100 mL IVPB  Status:  Discontinued        2 g 200 mL/hr over 30 Minutes Intravenous  Every 24 hours 07/29/21 2313 07/31/21 1147   07/29/21 2015  cefTRIAXone (ROCEPHIN) 1 g in sodium chloride 0.9 % 100 mL IVPB        1 g 200 mL/hr over 30 Minutes Intravenous  Once 07/29/21 2000 07/29/21 2039   07/29/21 2015  azithromycin (ZITHROMAX) 500 mg in sodium chloride 0.9 % 250 mL IVPB        500 mg 250 mL/hr over 60 Minutes Intravenous  Once 07/29/21 2000 07/29/21 2114       Subjective: Patient was seen and examined at bedside.  Overnight events noted.   Patient is very hard of hearing.  Patient communicates by writing on the board.   He is alert and oriented following full commands.  He still reports having burning urination.  Objective: Vitals:   08/02/21 1650 08/02/21 1957 08/03/21 0626 08/03/21 0943  BP: (!) 145/84 (!) 112/55 116/70 (!) 103/59  Pulse: 92 84 64 65  Resp:  16 18   Temp: 97.6 F (36.4 C) 98.6 F (37 C) 98.1 F (36.7 C)   TempSrc: Oral Oral Oral   SpO2:  97% 99%   Weight:      Height:        Intake/Output Summary (Last 24 hours) at 08/03/2021 1206 Last data filed at 08/03/2021 0827 Gross per 24 hour  Intake --  Output 1050 ml  Net -1050 ml   Filed Weights   07/29/21 1856  Weight: 65.8 kg    Examination:  General exam: Appears deconditioned, chronically ill looking, not in any distress,  appears comfortable. Respiratory system: Clear to auscultation bilaterally, respiratory effort normal, RR 16 Cardiovascular system: S1 & S2 heard, rate controlled,  Irregular rhythm, no murmur.   Gastrointestinal system: Abdomen is soft, nontender, nondistended, BS+ Central nervous system: Alert and oriented x 2. No focal neurological deficits. Extremities: No edema, no cyanosis, no clubbing. Skin: No rashes, lesions or ulcers Psychiatry: . Mood & affect appropriate.     Data Reviewed: I have personally reviewed following labs and imaging studies  CBC: Recent Labs  Lab 07/29/21 2000 07/30/21 0650 07/31/21 0325 08/01/21 0316 08/02/21 0328  08/03/21 0326  WBC 14.7* 10.0 9.2 9.8 9.5 9.1  NEUTROABS 11.8*  --  5.8  --   --   --   HGB 13.4 12.5* 11.6* 10.7* 10.6* 10.9*  HCT 40.2 37.3* 36.4* 33.0* 32.7* 33.2*  MCV 108.9* 110.0* 112.3* 111.1* 111.6* 111.0*  PLT 204 192 188 175 163 846   Basic Metabolic Panel: Recent Labs  Lab 07/29/21 2000 07/31/21 0325 08/01/21 0316 08/02/21 0328 08/03/21 0326  NA 137 140 139 138 138  K 3.9 3.6 3.8 3.6 4.0  CL 100 108 109 106 108  CO2 26 27 27 27 25   GLUCOSE 152* 78 90 96 97  BUN 41* 22 22 17 19   CREATININE 1.11 0.71 0.79 0.84 0.92  CALCIUM 11.0* 9.6 9.1 9.1 9.3  MG  --  1.6* 1.8  --  1.7  PHOS  --   --   --   --  2.7   GFR: Estimated Creatinine Clearance: 41.7 mL/min (by C-G formula based on SCr of 0.92 mg/dL). Liver Function Tests: Recent Labs  Lab 07/29/21 2000 08/01/21 0316  AST 31 26  ALT 20 17  ALKPHOS 79 53  BILITOT 1.2 0.8  PROT 7.6 5.3*  ALBUMIN 3.9 2.8*   No results for input(s): LIPASE, AMYLASE in the last 168 hours. No results for input(s): AMMONIA in the last 168 hours. Coagulation Profile: Recent Labs  Lab 07/30/21 0650 07/31/21 0325 08/01/21 0316 08/02/21 0328 08/03/21 0326  INR 5.0* 2.2* 1.6* 2.1* 2.9*   Cardiac Enzymes: No results for input(s): CKTOTAL, CKMB, CKMBINDEX, TROPONINI in the last 168 hours. BNP (last 3 results) No results for input(s): PROBNP in the last 8760 hours. HbA1C: No results for input(s): HGBA1C in the last 72 hours. CBG: No results for input(s): GLUCAP in the last 168 hours. Lipid Profile: No results for input(s): CHOL, HDL, LDLCALC, TRIG, CHOLHDL, LDLDIRECT in the last 72 hours. Thyroid Function Tests: No results for input(s): TSH, T4TOTAL, FREET4, T3FREE, THYROIDAB in the last 72 hours. Anemia Panel: Recent Labs    08/02/21 0328  VITAMINB12 753  FOLATE 10.9  FERRITIN 127  TIBC 180*  IRON 38*  RETICCTPCT 1.5   Sepsis Labs: Recent Labs  Lab 07/29/21 2000 07/31/21 0325 08/01/21 0316  PROCALCITON  --  0.24  0.16  LATICACIDVEN 1.8  --   --     Recent Results (from the past 240 hour(s))  Culture, blood (Routine x 2)     Status: None (Preliminary result)   Collection Time: 07/29/21  7:55 PM   Specimen: BLOOD  Result Value Ref Range Status   Specimen Description   Final    BLOOD BLOOD LEFT FOREARM Performed at [facility] [address], [place], [place] [address]    Special Requests   Final    BOTTLES DRAWN AEROBIC AND ANAEROBIC Blood Culture results may not be optimal due to an excessive volume of blood received in culture bottles Performed at [facility] [address], [place], [place] [address]    Culture   Final    NO GROWTH 4 DAYS Performed at [facility], [place] [address], [place], [place] [address]    Report Status PENDING  Incomplete  Culture, blood (Routine x 2)     Status: None (Preliminary result)   Collection Time: 07/29/21  8:00 PM   Specimen: BLOOD  Result Value Ref Range Status   Specimen Description   Final    BLOOD LEFT ANTECUBITAL Performed at [facility] [address] [place], [place] [address]    Special Requests   Final    BOTTLES DRAWN AEROBIC AND ANAEROBIC Blood Culture adequate volume Performed at [facility] [address], [place], [place] [address]    Culture   Final    NO GROWTH 4 DAYS Performed at [facility], [place] [address] [place], [place] [address]    Report Status PENDING  Incomplete  Urine Culture     Status: Abnormal   Collection Time: 07/29/21  8:14 PM   Specimen: Urine, Clean Catch  Result Value Ref Range Status   Specimen Description   Final    URINE, CLEAN CATCH Performed at [facility], [place] [address], [place], [place] [address]    Special Requests   Final    NONE Performed at [facility], [place] [address], [place], [place] [address]    Culture MULTIPLE SPECIES PRESENT, SUGGEST RECOLLECTION (A)   Final   Report Status 07/31/2021 FINAL  Final  Resp  Panel by RT-PCR (Flu A&B, Covid) Urine, Clean Catch     Status: None   Collection Time: 07/29/21  9:15 PM   Specimen: Urine, Clean Catch; Nasopharyngeal(NP) swabs in vial transport medium  Result Value Ref Range Status   SARS Coronavirus 2 by RT PCR NEGATIVE NEGATIVE Final    Comment: (NOTE) SARS-CoV-2 target nucleic acids are NOT DETECTED.  The SARS-CoV-2 RNA is generally detectable in upper respiratory specimens during the acute phase of infection. The lowest concentration of SARS-CoV-2 viral copies this assay can detect is 138 copies/mL. A negative result does not preclude SARS-Cov-2 infection and should not be used as the sole basis for treatment or other patient management decisions. A negative result may occur with  improper specimen collection/handling, submission of specimen other than nasopharyngeal swab, presence of viral mutation(s) within the areas targeted by this assay, and inadequate number of viral copies(<138 copies/mL). A negative result must be combined with clinical observations, patient history, and epidemiological information. The expected result is Negative.  Fact Sheet for Patients:  EntrepreneurPulse.com.au  Fact Sheet for Healthcare Providers:  IncredibleEmployment.be  This test is no t yet approved or cleared by the [place] FDA and  has been authorized for detection and/or diagnosis of SARS-CoV-2 by FDA under an Emergency Use Authorization (EUA). This EUA will remain  in effect (meaning this test can be used) for the duration of the COVID-19 declaration under Section 564(b)(1) of the Act, 21 U.S.C.section 360bbb-3(b)(1), unless the authorization is terminated  or revoked sooner.       Influenza A by PCR NEGATIVE NEGATIVE Final   Influenza B by PCR NEGATIVE NEGATIVE Final    Comment: (NOTE) The Xpert Xpress SARS-CoV-2/FLU/RSV plus assay is intended as an  aid in the diagnosis of influenza from Nasopharyngeal swab specimens and should not be used as a sole basis for treatment. Nasal washings and aspirates are unacceptable for Xpert Xpress SARS-CoV-2/FLU/RSV testing.  Fact Sheet for Patients: EntrepreneurPulse.com.au  Fact Sheet for Healthcare Providers: IncredibleEmployment.be  This test is not yet approved or cleared by the [place] FDA and has been authorized for detection and/or diagnosis of SARS-CoV-2 by FDA under an Emergency Use Authorization (EUA). This EUA will remain in effect (meaning this test can be used) for the duration of the COVID-19 declaration under Section 564(b)(1) of the Act, 21 U.S.C. section 360bbb-3(b)(1), unless the authorization is terminated or revoked.  Performed at [facility], [place] [address] [place], [place] [address]     Radiology Studies: DG CHEST PORT 1 VIEW  Result Date: 08/03/2021 CLINICAL DATA:  Follow up pneumonia. EXAM: PORTABLE CHEST 1 VIEW COMPARISON:  Chest radiographs 07/29/2021.  Abdominal CT 07/30/2021. FINDINGS: 0825 hours. Two views were obtained. The heart size and mediastinal contours are stable with aortic atherosclerosis. Patchy left greater than right basilar airspace opacities have mildly progressed in the interval. There may be a small amount of pleural fluid on the left. No evidence of pneumothorax. The bones appear unremarkable. Telemetry leads overlie the chest. IMPRESSION: Interval worsening of left greater than right basilar airspace opacities suspicious for progressive pneumonia, possibly on the basis of aspiration. Electronically Signed   By: Richardean Sale M.D.   On: 08/03/2021 09:02    Scheduled Meds:  atorvastatin  10 mg Oral QHS   Chlorhexidine Gluconate Cloth  6 each Topical Daily   clotrimazole   Topical BID   levothyroxine  50 mcg Oral q morning   liothyronine  5 mcg Oral q morning  metoprolol tartrate  25  mg Oral BID   mirabegron ER  25 mg Oral Daily   mirtazapine  15 mg Oral QHS   pantoprazole  40 mg Oral q morning   polyethylene glycol  17 g Oral q morning   tamsulosin  0.4 mg Oral q morning   Warfarin - Pharmacist Dosing Inpatient   Does not apply q1600   Continuous Infusions:     LOS: 5 days    Time spent: 35 mins    Salmaan Patchin, MD [facility]   If 7PM-7AM, please contact night-coverage

## 2021-08-03 NOTE — TOC Progression Note (Signed)
Transition of Care [facility]) - Progression Note    Patient Details  Name: Talbert Trembath MRN: [id number] Date of Birth: [birth year]  [facility] CM/SW Contact  Leeroy Cha, RN Phone Number: 08/03/2021, 8:18 AM  Clinical Narrative:    Ned Card the legal guardian/unable to talk on the phone right now wbc later today.   Expected Discharge Plan: [place] Barriers to Discharge: Continued Medical Work up  Expected Discharge Plan and Services Expected Discharge Plan: [place] In-house Referral: Clinical Social Work   Post Acute Care Choice: [place] Living arrangements for the past 2 months: [place]                                       Social Determinants of Health (SDOH) Interventions    Readmission Risk Interventions No flowsheet data found.

## 2021-08-03 NOTE — Plan of Care (Signed)

## 2021-08-03 NOTE — Progress Notes (Signed)
ANTICOAGULATION CONSULT NOTE  Pharmacy Consult for warfarin Indication: atrial fibrillation  Allergies  Allergen Reactions   Aspirin Other (See Comments)    Unknown reaction - listed on [facility] 07/30/21   Clindamycin/Lincomycin Other (See Comments)    Unknown reaction - listed on [facility] 07/30/21   Other Other (See Comments)    Unknown reaction to opioids - listed on [facility] 07/30/21   Sulfa Antibiotics Other (See Comments)    Unknown reaction - listed on [facility] 07/30/21    Patient Measurements: Height: 5\' 11"  (180.3 cm) Weight: 65.8 kg (145 lb) IBW/kg (Calculated) : 75.3  Vital Signs: Temp: 98.1 F (36.7 C) (01/31 0626) Temp Source: Oral (01/31 0626) BP: 103/59 (01/31 0943) Pulse Rate: 65 (01/31 0943)  Labs: Recent Labs    08/01/21 0316 08/02/21 0328 08/03/21 0326  HGB 10.7* 10.6* 10.9*  HCT 33.0* 32.7* 33.2*  PLT 175 163 157  LABPROT 19.4* 23.9* 30.1*  INR 1.6* 2.1* 2.9*  CREATININE 0.79 0.84 0.92     Estimated Creatinine Clearance: 41.7 mL/min (by C-G formula based on SCr of 0.92 mg/dL).   Medical History: Past Medical History:  Diagnosis Date   Atrial fibrillation ([place])    Hyperlipidemia    Hypertension    Thyrotoxicosis     Medications: Pt prescribed warfarin PTA for atrial fibrillation -Per med rec, pt was self-administering medications prior to admission at ALF. Unable to confirm time/date of last warfarin dose PTA. INR was supratherapeutic on admission -Most recent reported regimen from ALF: 2 mg Sun, Tues; 3 mg all other days. Unable to confirm compliance.   Significant Events: -INR 5.1 on admission; s/p vitamin K 2.5 mg PO on 1/27  Assessment: Pt is a 88 year old male presenting from ALF with UTI. Pt prescribed warfarin prior to admission for atrial fibrillation. No history of CVA noted. INR now therapeutic s/p vitamin K, pharmacy consulted to resume warfarin dosing inpatient. Note plan to discharge to SNF.  Today, 08/03/21 INR still therapeutic but jumped  from borderline low to borderline high overnight; possibly d/t boosted doses CBC: Hgb low but stable from yesterday; Plt WNL Diet: Dysphagia 1; meal intake increased with 60-100% charted No major DDI, though antibiotics may enhance effects of warfarin.  Goal of Therapy:  INR 2-3 Monitor platelets by anticoagulation protocol: Yes   Plan:  Warfarin 1 mg PO tonight INR daily CBC at least q72 hr while on warfarin inpatient  Since unable to confirm warfarin regimen/dose/compliance PTA, will need close follow up at discharge. At this point would recommend continuing home warfarin regimen and INR check within 72 hours of discharge to further guide dose adjustments.   [name] A, PharmD 08/03/2021,12:39 PM

## 2021-08-04 LAB — PROCALCITONIN: Procalcitonin: 0.16 ng/mL

## 2021-08-04 LAB — PROTIME-INR
INR: 2.9 — ABNORMAL HIGH (ref 0.8–1.2)
Prothrombin Time: 30 seconds — ABNORMAL HIGH (ref 11.4–15.2)

## 2021-08-04 MED ORDER — WARFARIN SODIUM 2 MG PO TABS
2.0000 mg | ORAL_TABLET | Freq: Once | ORAL | Status: AC
Start: 2021-08-04 — End: 2021-08-04
  Administered 2021-08-04: 2 mg via ORAL
  Filled 2021-08-04: qty 1

## 2021-08-04 MED ORDER — SODIUM CHLORIDE 0.9 % IV SOLN
1.5000 g | Freq: Four times a day (QID) | INTRAVENOUS | Status: DC
  Administered 2021-08-04 – 2021-08-06 (×9): 1.5 g via INTRAVENOUS
  Filled 2021-08-04 (×10): qty 4
  Filled 2021-08-04: qty 1.5

## 2021-08-04 MED ORDER — CHLORHEXIDINE GLUCONATE 0.12 % MT SOLN
15.0000 mL | Freq: Two times a day (BID) | OROMUCOSAL | Status: DC
  Administered 2021-08-04 – 2021-08-06 (×5): 15 mL via OROMUCOSAL
  Filled 2021-08-04 (×5): qty 15

## 2021-08-04 MED ORDER — SENNOSIDES-DOCUSATE SODIUM 8.6-50 MG PO TABS
1.0000 | ORAL_TABLET | Freq: Two times a day (BID) | ORAL | Status: DC
  Administered 2021-08-04 – 2021-08-06 (×4): 1 via ORAL
  Filled 2021-08-04 (×4): qty 1

## 2021-08-04 MED ORDER — ORAL CARE MOUTH RINSE
15.0000 mL | Freq: Two times a day (BID) | OROMUCOSAL | Status: DC
  Administered 2021-08-04 – 2021-08-06 (×3): 15 mL via OROMUCOSAL

## 2021-08-04 NOTE — TOC Progression Note (Signed)
Transition of Care [facility]) - Progression Note    Patient Details  Name: Craig Snow MRN: [id number] Date of Birth: [birth year]  Transition of Care [facility]) CM/SW Contact  Leeroy Cha, RN Phone Number: 08/04/2021, 10:54 AM  Clinical Narrative:    Tct-[name] the son/given the list of accepted snf providers.  Has [facility] to [facility].  He lives in the [place] part of town and father in the [place].  Will call me back with decision.    Expected Discharge Plan: [place] Barriers to Discharge: Continued Medical Work up  Expected Discharge Plan and Services Expected Discharge Plan: [place] In-house Referral: Clinical Social Work   Post Acute Care Choice: [place] Living arrangements for the past 2 months: [place]                                       Social Determinants of Health (SDOH) Interventions    Readmission Risk Interventions No flowsheet data found.

## 2021-08-04 NOTE — Progress Notes (Signed)
Speech Language Pathology Treatment: Dysphagia  Patient Details Name: Craig Snow MRN: [id number] DOB: 1920/08/09 Today's Date: 08/04/2021 Time: 4709-2957 SLP Time Calculation (min) (ACUTE ONLY): 34 min  Assessment / Plan / Recommendation Clinical Impression  Second session with Craig Snow for dysphagia goals. upon oral inspection - Craig Snow had retained orange colored coating on posterior left oral cavity without awareness.   SLP instructed Craig Snow to remove his obturator and set up cleaning. Obturator was completed covered with viscous secretions and Craig Snow reported that's the "worst I've ever seen that". SLP provided Craig Snow with education re: importance to oral care - using toothbrush and antibacterial non-alcohol rinse due to bacteria load.  Craig Snow reports understanding and asked what materials he would require.  Assisted to clean obturator removing copious thick secretions/? food with paper towel.  Introduced oral suction to Craig Snow having him demonstrate use x3 - he required extra time to return demonstration.  Using teach back, Craig Snow with understanding.    HPI HPI: Craig Snow isa 88 yo male adm to [facility] with penile discomfort - found to have low BP and CXR indicating pna. 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 Craig Snow recently moved to [place] and stays at [facility].  Swallow eval ordered. He denies having prior swallow evaluation.      SLP Plan  Continue with current plan of care      Recommendations for follow up therapy are one component of a multi-disciplinary discharge planning process, led by the attending physician.  Recommendations may be updated based on patient status, additional functional criteria and insurance authorization.    Recommendations  Diet recommendations: Nectar-thick liquid;Thin liquid;Dysphagia 1 (puree) Liquids provided via: Cup Medication Administration: Whole meds with liquid (or with nectar) Supervision: Patient able to self feed;Intermittent  supervision to cue for compensatory strategies Compensations: Slow rate;Small sips/bites Postural Changes and/or Swallow Maneuvers: Seated upright 90 degrees;Upright 30-60 min after meal                Oral Care Recommendations: Oral care before and after PO Follow Up Recommendations: Other (comment) Assistance recommended at discharge: Intermittent Supervision/Assistance SLP Visit Diagnosis: Dysphagia, unspecified (R13.10);Dysphagia, oral phase (R13.11) Plan: Continue with current plan of care         Kathleen Lime, MS [place] Office [phone] Cell [place], [place]  08/04/2021, 4:41 PM

## 2021-08-04 NOTE — Progress Notes (Signed)
PROGRESS NOTE    Craig Snow  YPP:[id number] DOB: 06-27-1921 DOA: 07/29/2021 PCP: Pcp, No    Brief Narrative:  This 88 year old male with PMH significant of A. fib on Coumadin, remote history of palate cancer, HTN, HLD, recent bladder cancer with chronic Foley who comes in from ALF with increasing penile pain.  He was recently diagnosed with bladder cancer about 4 to 5 months ago, underwent TURP, has chronic indwelling Foley catheter and currently undergoing BCG therapy. He has been having dysuria and lower abdominal pain.  He is found to have bilateral pneumonia possible aspiration,  started on antibiotics.  Assessment & Plan:   Principal Problem:   Atrial fibrillation with RVR ([place]) Active Problems:   Acute lower UTI   Community acquired pneumonia   Supratherapeutic INR   Candidiasis of penis   Stage 1 skin ulcer of sacral region [facility])   Bladder cancer ([place])  UTI due to chronic indwelling urinary catheter: History of bladder cancer: He failed outpatient therapy with Macrobid, presented with dysuria and abdominal pain. Patient was started on cefepime, urine culture contaminated. He was found to have genital erythema with concern for Fournier's gangrene, urology was consulted. This is unlikely and physical exam and imaging most likely suggest superficial infection such as fungal, treated with topical clotrimazole.  We will discontinue antibiotics since patient does have chronic Foley catheter.  Follow-up outpatient with Dr. Claudia Desanctis next week. Patient also reports significant bladder cramping, discussed with Dr. Alinda Money,  started patient on Myrbetriq.  If it does not help could potentially try to replace the Foley catheter. Cramping improved.  Bilateral pneumonia, possible aspiration: Chest x-ray on admission showed patchy left greater than right basilar opacities concerning for pneumonia. He also complains of cough but no significant other respiratory symptoms. He has completed cefepime  and Zithromax for 5 days. Repeat chest x-ray showed worsening pneumonia possible aspiration. Consider Unasyn for 3 days, speech and swallow eval.  Penile candidiasis: Continue clotrimazole, rash is improving.  Chronic atrial fibrillation: Heart rate is well controlled.  Continue metoprolol. Continue Coumadin as per pharmacy. INR was supratherapeutic,  now improved.  Hypothyroidism: Continue levothyroxine and liothyronine.  Hyperlipidemia: Continue statins.  Hypomagnesemia: replaced and improved.  Coccyx pressure injury, stage I, POA Pressure Injury 07/30/21 Coccyx Mid Stage 1 -  Intact skin with non-blanchable redness of a localized area usually over a bony prominence. (Active)  07/30/21 2045  Location: Coccyx  Location Orientation: Mid  Staging: Stage 1 -  Intact skin with non-blanchable redness of a localized area usually over a bony prominence.  Wound Description (Comments):   Present on Admission: Yes   DVT prophylaxis: SCDs Code Status: DNR Family Communication: No family at bed side Disposition Plan:   Status is: Inpatient  Remains inpatient appropriate because: Persistent symptoms.  Consultants:  Urology Wound care  Procedures: None Antimicrobials:  Anti-infectives (From admission, onward)    Start     Dose/Rate Route Frequency Ordered Stop   08/04/21 1415  ampicillin-sulbactam (UNASYN) 1.5 g in sodium chloride 0.9 % 100 mL IVPB        1.5 g 200 mL/hr over 30 Minutes Intravenous Every 6 hours 08/04/21 1327 08/07/21 1414   07/31/21 2200  ceFEPIme (MAXIPIME) 1 g in sodium chloride 0.9 % 100 mL IVPB  Status:  Discontinued        1 g 200 mL/hr over 30 Minutes Intravenous Every 12 hours 07/31/21 1147 07/31/21 1150   07/31/21 2200  ceFEPIme (MAXIPIME) 2 g in sodium chloride 0.9 %  100 mL IVPB  Status:  Discontinued        2 g 200 mL/hr over 30 Minutes Intravenous Every 12 hours 07/31/21 1150 08/03/21 0712   07/30/21 2200  azithromycin (ZITHROMAX) 500 mg in  sodium chloride 0.9 % 250 mL IVPB  Status:  Discontinued        500 mg 250 mL/hr over 60 Minutes Intravenous Every 24 hours 07/29/21 2156 08/03/21 0712   07/30/21 0400  ceFEPIme (MAXIPIME) 2 g in sodium chloride 0.9 % 100 mL IVPB  Status:  Discontinued        2 g 200 mL/hr over 30 Minutes Intravenous Every 24 hours 07/29/21 2313 07/31/21 1147   07/29/21 2015  cefTRIAXone (ROCEPHIN) 1 g in sodium chloride 0.9 % 100 mL IVPB        1 g 200 mL/hr over 30 Minutes Intravenous  Once 07/29/21 2000 07/29/21 2039   07/29/21 2015  azithromycin (ZITHROMAX) 500 mg in sodium chloride 0.9 % 250 mL IVPB        500 mg 250 mL/hr over 60 Minutes Intravenous  Once 07/29/21 2000 07/29/21 2114       Subjective: Patient was seen and examined at bedside.  Overnight events noted.   Patient is very hard of hearing.  Patient communicates by writing on the board.   Patient was sitting on the chair, reports still having a lot of penile pain. Patient still reports having cough, drooling of food while swallowing.  Objective: Vitals:   08/04/21 0532 08/04/21 0600 08/04/21 0630 08/04/21 1024  BP:   (!) 99/54 (!) 104/55  Pulse:   64 80  Resp: 19 17 18 19   Temp:   98.4 F (36.9 C) 97.8 F (36.6 C)  TempSrc:   Oral Oral  SpO2:   95% 100%  Weight:      Height:        Intake/Output Summary (Last 24 hours) at 08/04/2021 1332 Last data filed at 08/04/2021 1102 Gross per 24 hour  Intake 360 ml  Output 900 ml  Net -540 ml   Filed Weights   07/29/21 1856  Weight: 65.8 kg    Examination:  General exam: Appears chronically ill looking, deconditioned, not in any distress.  Comfortable Respiratory system: Clear to auscultation bilaterally, respiratory effort normal, RR 15 Cardiovascular system: S1 & S2 heard, rate controlled,  Irregular rhythm, no murmur.   Gastrointestinal system: Abdomen is soft, nontender, non distended, BS+ Central nervous system: Alert and oriented x 2. No focal neurological  deficits. Extremities: No edema, no cyanosis, no clubbing. Skin: No rashes, lesions or ulcers Psychiatry: . Mood & affect appropriate.     Data Reviewed: I have personally reviewed following labs and imaging studies  CBC: Recent Labs  Lab 07/29/21 2000 07/30/21 0650 07/31/21 0325 08/01/21 0316 08/02/21 0328 08/03/21 0326  WBC 14.7* 10.0 9.2 9.8 9.5 9.1  NEUTROABS 11.8*  --  5.8  --   --   --   HGB 13.4 12.5* 11.6* 10.7* 10.6* 10.9*  HCT 40.2 37.3* 36.4* 33.0* 32.7* 33.2*  MCV 108.9* 110.0* 112.3* 111.1* 111.6* 111.0*  PLT 204 192 188 175 163 194   Basic Metabolic Panel: Recent Labs  Lab 07/29/21 2000 07/31/21 0325 08/01/21 0316 08/02/21 0328 08/03/21 0326  NA 137 140 139 138 138  K 3.9 3.6 3.8 3.6 4.0  CL 100 108 109 106 108  CO2 26 27 27 27 25   GLUCOSE 152* 78 90 96 97  BUN 41* 22 22 17  19  CREATININE 1.11 0.71 0.79 0.84 0.92  CALCIUM 11.0* 9.6 9.1 9.1 9.3  MG  --  1.6* 1.8  --  1.7  PHOS  --   --   --   --  2.7   GFR: Estimated Creatinine Clearance: 41.7 mL/min (by C-G formula based on SCr of 0.92 mg/dL). Liver Function Tests: Recent Labs  Lab 07/29/21 2000 08/01/21 0316  AST 31 26  ALT 20 17  ALKPHOS 79 53  BILITOT 1.2 0.8  PROT 7.6 5.3*  ALBUMIN 3.9 2.8*   No results for input(s): LIPASE, AMYLASE in the last 168 hours. No results for input(s): AMMONIA in the last 168 hours. Coagulation Profile: Recent Labs  Lab 07/31/21 0325 08/01/21 0316 08/02/21 0328 08/03/21 0326 08/04/21 0339  INR 2.2* 1.6* 2.1* 2.9* 2.9*   Cardiac Enzymes: No results for input(s): CKTOTAL, CKMB, CKMBINDEX, TROPONINI in the last 168 hours. BNP (last 3 results) No results for input(s): PROBNP in the last 8760 hours. HbA1C: No results for input(s): HGBA1C in the last 72 hours. CBG: No results for input(s): GLUCAP in the last 168 hours. Lipid Profile: No results for input(s): CHOL, HDL, LDLCALC, TRIG, CHOLHDL, LDLDIRECT in the last 72 hours. Thyroid Function  Tests: No results for input(s): TSH, T4TOTAL, FREET4, T3FREE, THYROIDAB in the last 72 hours. Anemia Panel: Recent Labs    08/02/21 0328  VITAMINB12 753  FOLATE 10.9  FERRITIN 127  TIBC 180*  IRON 38*  RETICCTPCT 1.5   Sepsis Labs: Recent Labs  Lab 07/29/21 2000 07/31/21 0325 08/01/21 0316 08/04/21 0339  PROCALCITON  --  0.24 0.16 0.16  LATICACIDVEN 1.8  --   --   --     Recent Results (from the past 240 hour(s))  Culture, blood (Routine x 2)     Status: None   Collection Time: 07/29/21  7:55 PM   Specimen: BLOOD  Result Value Ref Range Status   Specimen Description   Final    BLOOD BLOOD LEFT FOREARM Performed at [facility] [address], [place], [place] [address]    Special Requests   Final    BOTTLES DRAWN AEROBIC AND ANAEROBIC Blood Culture results may not be optimal due to an excessive volume of blood received in culture bottles Performed at [facility] [address], [place], [place] [address]    Culture   Final    NO GROWTH 5 DAYS Performed at [facility], [place] [address], [place], [place] [address]    Report Status 08/03/2021 FINAL  Final  Culture, blood (Routine x 2)     Status: None   Collection Time: 07/29/21  8:00 PM   Specimen: BLOOD  Result Value Ref Range Status   Specimen Description   Final    BLOOD LEFT ANTECUBITAL Performed at [facility] [address] [place], [place] [address]    Special Requests   Final    BOTTLES DRAWN AEROBIC AND ANAEROBIC Blood Culture adequate volume Performed at [facility] [address] [place], [place] [address]    Culture   Final    NO GROWTH 5 DAYS Performed at [facility], [place] [address], [place], [place] [address]    Report Status 08/03/2021 FINAL  Final  Urine Culture     Status: Abnormal   Collection Time: 07/29/21  8:14 PM   Specimen: Urine, Clean Catch  Result Value Ref Range Status   Specimen  Description  Final    URINE, CLEAN CATCH Performed at [facility], [place] [address] [place], [place] [address]    Special Requests   Final    NONE Performed at [facility], [place] [address], [place], [place] [address]    Culture MULTIPLE SPECIES PRESENT, SUGGEST RECOLLECTION (A)  Final   Report Status 07/31/2021 FINAL  Final  Resp Panel by RT-PCR (Flu A&B, Covid) Urine, Clean Catch     Status: None   Collection Time: 07/29/21  9:15 PM   Specimen: Urine, Clean Catch; Nasopharyngeal(NP) swabs in vial transport medium  Result Value Ref Range Status   SARS Coronavirus 2 by RT PCR NEGATIVE NEGATIVE Final    Comment: (NOTE) SARS-CoV-2 target nucleic acids are NOT DETECTED.  The SARS-CoV-2 RNA is generally detectable in upper respiratory specimens during the acute phase of infection. The lowest concentration of SARS-CoV-2 viral copies this assay can detect is 138 copies/mL. A negative result does not preclude SARS-Cov-2 infection and should not be used as the sole basis for treatment or other patient management decisions. A negative result may occur with  improper specimen collection/handling, submission of specimen other than nasopharyngeal swab, presence of viral mutation(s) within the areas targeted by this assay, and inadequate number of viral copies(<138 copies/mL). A negative result must be combined with clinical observations, patient history, and epidemiological information. The expected result is Negative.  Fact Sheet for Patients:  EntrepreneurPulse.com.au  Fact Sheet for Healthcare Providers:  IncredibleEmployment.be  This test is no t yet approved or cleared by the [place] FDA and  has been authorized for detection and/or diagnosis of SARS-CoV-2 by FDA under an Emergency Use Authorization (EUA). This EUA will remain  in effect (meaning this test can be used) for the duration of the COVID-19  declaration under Section 564(b)(1) of the Act, 21 U.S.C.section 360bbb-3(b)(1), unless the authorization is terminated  or revoked sooner.       Influenza A by PCR NEGATIVE NEGATIVE Final   Influenza B by PCR NEGATIVE NEGATIVE Final    Comment: (NOTE) The Xpert Xpress SARS-CoV-2/FLU/RSV plus assay is intended as an aid in the diagnosis of influenza from Nasopharyngeal swab specimens and should not be used as a sole basis for treatment. Nasal washings and aspirates are unacceptable for Xpert Xpress SARS-CoV-2/FLU/RSV testing.  Fact Sheet for Patients: EntrepreneurPulse.com.au  Fact Sheet for Healthcare Providers: IncredibleEmployment.be  This test is not yet approved or cleared by the [place] FDA and has been authorized for detection and/or diagnosis of SARS-CoV-2 by FDA under an Emergency Use Authorization (EUA). This EUA will remain in effect (meaning this test can be used) for the duration of the COVID-19 declaration under Section 564(b)(1) of the Act, 21 U.S.C. section 360bbb-3(b)(1), unless the authorization is terminated or revoked.  Performed at [facility], [place] [address] [place],  [address]     Radiology Studies: DG CHEST PORT 1 VIEW  Result Date: 08/03/2021 CLINICAL DATA:  Follow up pneumonia. EXAM: PORTABLE CHEST 1 VIEW COMPARISON:  Chest radiographs 07/29/2021.  Abdominal CT 07/30/2021. FINDINGS: 0825 hours. Two views were obtained. The heart size and mediastinal contours are stable with aortic atherosclerosis. Patchy left greater than right basilar airspace opacities have mildly progressed in the interval. There may be a small amount of pleural fluid on the left. No evidence of pneumothorax. The bones appear unremarkable. Telemetry leads overlie the chest. IMPRESSION: Interval worsening of left greater than right basilar airspace opacities suspicious for progressive pneumonia, possibly  on the basis  of aspiration. Electronically Signed   By: Richardean Sale M.D.   On: 08/03/2021 09:02    Scheduled Meds:  atorvastatin  10 mg Oral QHS   chlorhexidine  15 mL Mouth Rinse BID   Chlorhexidine Gluconate Cloth  6 each Topical Daily   clotrimazole   Topical BID   levothyroxine  50 mcg Oral q morning   liothyronine  5 mcg Oral q morning   mouth rinse  15 mL Mouth Rinse q12n4p   metoprolol tartrate  25 mg Oral BID   mirabegron ER  25 mg Oral Daily   mirtazapine  15 mg Oral QHS   pantoprazole  40 mg Oral q morning   polyethylene glycol  17 g Oral q morning   tamsulosin  0.4 mg Oral q morning   Warfarin - Pharmacist Dosing Inpatient   Does not apply q1600   Continuous Infusions:  ampicillin-sulbactam (UNASYN) IV        LOS: 6 days    Time spent: 35 mins    Alliya Marcon, MD [facility]   If 7PM-7AM, please contact night-coverage

## 2021-08-04 NOTE — Care Management Important Message (Signed)
Important Message  Patient Details IM Letter placed in Patients room. Name: Craig Snow MRN: [id number] Date of Birth: Jul 04, 1921   Medicare Important Message Given:  Yes     Kerin Salen 08/04/2021, 11:18 AM

## 2021-08-04 NOTE — Progress Notes (Signed)
ANTICOAGULATION CONSULT NOTE  Pharmacy Consult for warfarin Indication: atrial fibrillation  Allergies  Allergen Reactions   Aspirin Other (See Comments)    Unknown reaction - listed on [facility] 07/30/21   Clindamycin/Lincomycin Other (See Comments)    Unknown reaction - listed on [facility] 07/30/21   Other Other (See Comments)    Unknown reaction to opioids - listed on [facility] 07/30/21   Sulfa Antibiotics Other (See Comments)    Unknown reaction - listed on [facility] 07/30/21    Patient Measurements: Height: 5\' 11"  (180.3 cm) Weight: 65.8 kg (145 lb) IBW/kg (Calculated) : 75.3  Vital Signs: Temp: 97.8 F (36.6 C) (02/01 1024) Temp Source: Oral (02/01 1024) BP: 104/55 (02/01 1024) Pulse Rate: 80 (02/01 1024)  Labs: Recent Labs    08/02/21 0328 08/03/21 0326 08/04/21 0339  HGB 10.6* 10.9*  --   HCT 32.7* 33.2*  --   PLT 163 157  --   LABPROT 23.9* 30.1* 30.0*  INR 2.1* 2.9* 2.9*  CREATININE 0.84 0.92  --      Estimated Creatinine Clearance: 41.7 mL/min (by C-G formula based on SCr of 0.92 mg/dL).  Medications: Pt prescribed warfarin PTA for atrial fibrillation -Per med rec, pt was self-administering medications prior to admission at ALF. Unable to confirm time/date of last warfarin dose PTA. INR was supratherapeutic on admission -Most recent reported regimen from ALF: 2 mg Sun, Tues; 3 mg all other days. Unable to confirm compliance.   Significant Events: -INR 5.1 on admission; s/p vitamin K 2.5 mg PO on 1/27  Assessment: Pt is a 88 year old male presenting from ALF with UTI. Pt prescribed warfarin prior to admission for atrial fibrillation. No history of CVA noted. INR now therapeutic s/p vitamin K, pharmacy consulted to resume warfarin dosing inpatient. Note plan to discharge to SNF.  Today, 08/04/21 INR remains therapeutic - borderline high but stable from yesterday CBC (1/31): Hgb low but stable; Plt WNL Diet: Dysphagia 1; 50-95% meals charted No major DDI, though antibiotics  may enhance effects of warfarin.  Goal of Therapy:  INR 2-3 Monitor platelets by anticoagulation protocol: Yes   Plan:  Warfarin 2 mg PO tonight INR daily CBC at least q72 hr while on warfarin inpatient  Since unable to confirm warfarin regimen/dose/compliance PTA, will need close follow up at discharge. At this point would recommend continuing home warfarin regimen and INR check within 72 hours of discharge to further guide dose adjustments.   Nikolaj Geraghty, Cindie Laroche, PharmD 08/04/2021,2:05 PM

## 2021-08-04 NOTE — Progress Notes (Addendum)
Speech Language Pathology Treatment: Dysphagia  Patient Details Name: Craig Snow MRN: [id number] DOB: [birth year] Today's Date: 08/04/2021 Time: 2426-8341 SLP Time Calculation (min) (ACUTE ONLY): 34 min  Assessment / Plan / Recommendation Clinical Impression  Session focused on addressing dysphagia goals and reviewing that MD re-ordered swallow evaluation due to concerns for potential pulmonary impacts/effects of aspiration.  SLP has worked with pt on the previous Friday re: his dysphagia- chronicity of aspiration and at that time he declined MBS.  Currently pt is willing to undergo MBS after explaining that it may help [place] to determine if any postures may be helpful to protect his airway.  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 Also reviewed increased pna risk given his decreased mobility, current illness and age.  After discussion of benefit/risk, pt willing to undergo testing.  SLP phoned xray but unable to conduct test today due pt needing extra time with his hearing loss.  MBS planned for morning 08/04/2021.     HPI HPI: pt isa 88 yo male adm to [facility] with penile discomfort - found to have low BP and CXR indicating pna. 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 Pt recently moved to [place] and stays at [facility].  Swallow eval ordered. He denies having prior swallow evaluation.      SLP Plan  Continue with current plan of care      Recommendations for follow up therapy are one component of a multi-disciplinary discharge planning process, led by the attending physician.  Recommendations may be updated based on patient status, additional functional criteria and insurance authorization.    Recommendations  Diet recommendations: Nectar-thick liquid;Thin liquid;Dysphagia 1 (puree) Liquids provided via: Cup Medication Administration: Whole meds with liquid (or with  nectar) Supervision: Patient able to self feed;Intermittent supervision to cue for compensatory strategies Compensations: Slow rate;Small sips/bites Postural Changes and/or Swallow Maneuvers: Seated upright 90 degrees;Upright 30-60 min after meal                Oral Care Recommendations: Oral care before and after PO Follow Up Recommendations: Other (comment) Assistance recommended at discharge: Intermittent Supervision/Assistance SLP Visit Diagnosis: Dysphagia, unspecified (R13.10);Dysphagia, oral phase (R13.11) Plan: Continue with current plan of care         Kathleen Lime, MS [place] Office [phone] Cell [place], [place]  08/04/2021, 4:41 PM

## 2021-08-05 ENCOUNTER — Inpatient Hospital Stay (HOSPITAL_COMMUNITY): Payer: Medicare Other

## 2021-08-05 LAB — PROTIME-INR
INR: 2.6 — ABNORMAL HIGH (ref 0.8–1.2)
Prothrombin Time: 27.9 seconds — ABNORMAL HIGH (ref 11.4–15.2)

## 2021-08-05 MED ORDER — WARFARIN SODIUM 2 MG PO TABS
2.0000 mg | ORAL_TABLET | Freq: Once | ORAL | Status: AC
  Administered 2021-08-05: 2 mg via ORAL
  Filled 2021-08-05: qty 1

## 2021-08-05 NOTE — Progress Notes (Signed)
Patient reports only one episode of pain since catheter exchange, and per patient, "wasn't as bad."  Patient reported pain ceasing within less than 1 minute.  Patient claims he has not had a BM "in a month."  No evidence of having had BM since admission.  MD aware.  Angie Fava, RN

## 2021-08-05 NOTE — Progress Notes (Signed)
[place] for warfarin Indication: atrial fibrillation  Allergies  Allergen Reactions   Aspirin Other (See Comments)    Unknown reaction - listed on [facility] 07/30/21   Clindamycin/Lincomycin Other (See Comments)    Unknown reaction - listed on [facility] 07/30/21   Other Other (See Comments)    Unknown reaction to opioids - listed on [facility] 07/30/21   Sulfa Antibiotics Other (See Comments)    Unknown reaction - listed on [facility] 07/30/21    Patient Measurements: Height: 5\' 11"  (180.3 cm) Weight: 65.8 kg (145 lb) IBW/kg (Calculated) : 75.3  Vital Signs:    Labs: Recent Labs    08/03/21 0326 08/04/21 0339 08/05/21 0340  HGB 10.9*  --   --   HCT 33.2*  --   --   PLT 157  --   --   LABPROT 30.1* 30.0* 27.9*  INR 2.9* 2.9* 2.6*  CREATININE 0.92  --   --      Estimated Creatinine Clearance: 41.7 mL/min (by C-G formula based on SCr of 0.92 mg/dL).  Medications: Pt prescribed warfarin PTA for atrial fibrillation -Per med rec, pt was self-administering medications prior to admission at ALF. Unable to confirm time/date of last warfarin dose PTA. INR was supratherapeutic on admission -Most recent reported regimen from ALF: 2 mg Sun, Tues; 3 mg all other days. Unable to confirm compliance.   Significant Events: -INR 5.1 on admission; s/p vitamin K 2.5 mg PO on 1/27  Assessment: Pt is a 88 year old male presenting from ALF with UTI. Pt prescribed warfarin prior to admission for atrial fibrillation. No history of CVA noted. Pharmacy consulted to dose/monitor warfarin. Note plan to discharge to SNF.  Today, 08/05/21 INR = 2.6 remains therapeutic  CBC (1/31): Hgb low but stable; Plt WNL Diet: Dysphagia 1; 50% meal intake charted No major DDI, though antibiotics may enhance effects of warfarin.  Goal of Therapy:  INR 2-3 Monitor platelets by anticoagulation protocol: Yes   Plan:  Warfarin 2 mg PO today INR daily CBC at least q72 hr while on warfarin  inpatient Monitor for signs of bleeding  If patient were to discharge today, I would recommend discharging him on warfarin 2 mg PO daily with INR check within 72 hours of discharge to guide further dose adjustments.   Lenis Noon, PharmD 08/05/2021,9:38 AM

## 2021-08-05 NOTE — Progress Notes (Signed)
Removed patient's indwelling catheter and replaced per MD order.  Patient tolerated well.  Angie Fava, RN

## 2021-08-05 NOTE — Progress Notes (Signed)
Modified Barium Swallow Progress Note  Patient Details  Name: Craig Snow MRN: [id number] Date of Birth: [birth year]  Today's Date: 08/05/2021  Modified Barium Swallow completed.  Full report located under Chart Review in the Imaging Section.  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  Decreased oral propulsive abilities and impaired mastication due to palatal surgery noted resulting in delayed swallow, premature spillage and retention of pudding on uvula. Pt does piecemeal and suspect this is compensation.  Pharyngeal and cervical esophageal swallow marked by impaired motiltiy resulting in diminished clearance of boluses - even secretions-, and impaired airway closure.  These above factors result in gross retention and significant aspiration of thin - even with head of chair reclined.  Aspiration occurs both during swallow and after due to impaired laryngeal closure and also from pyriform sinus retention spilling into open airway with cued dry swallow.  Pt had subtle cough with aspiration x1 with large amount but this was not effective for airway clearance.  Silent aspiration note with less aspiration.  Postures and compensation strategies including tiled head of chair to approx 45* with neck extended did assist with boluses being retained in pharynx and slightly thicker *nectar* liquids were retained due to improved viscocity.  Dry swallows, following solids with liquids. cough and expectorate helped maximize airway protection.  SLP suspects pt's dysphagia may be contributing to his weight loss and deconditioning.  Given level of dysphagia, pt has been aspirating chronically for years and able to manage.  Purpose of MBS was to determine if specific strategies may be helpful. Pt will have ongoing aspiration but mitigation strategies help to improve airway  protection.  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  Will follow briefly to support pt's use of compensations for airway protection and comfort.   Swallow Evaluation Recommendations       SLP Diet Recommendations: Nectar thick liquid (frazier water protocol)   Liquid Administration via: Cup;No straw;Spoon   Medication Administration: Crushed with puree; start and follow with nectar   Supervision: Patient able to self feed   Compensations: Slow rate;Small sips/bites;Multiple dry swallows after each bite/sip;Follow solids with liquid;Other (Comment) ("cough and hock" intermittently), Effortful swallow   Postural Changes: Other (Comment) Head of bed reclined to approx 45* with all po   Oral Care Recommendations: Oral care QID   Other Recommendations: Order thickener from pharmacy;Have oral suction available   Kathleen Lime, MS [facility] SLP Acute Rehab Services Office [phone] Cell [phone]  Macario Golds 08/05/2021,9:44 AM

## 2021-08-05 NOTE — Progress Notes (Signed)
PROGRESS NOTE    Craig Snow  ZOX:[id number] DOB: Nov 17, 1920 DOA: 07/29/2021 PCP: Pcp, No    Brief Narrative:  This 88 year old male with PMH significant of A. fib on Coumadin, remote history of palate cancer, HTN, HLD, recent bladder cancer with chronic Foley who comes in from ALF with increasing penile pain.  He was recently diagnosed with bladder cancer about 4 to 5 months ago, underwent TURP, has chronic indwelling Foley catheter and currently undergoing BCG therapy. He has been having dysuria and lower abdominal pain.  He is found to have bilateral pneumonia possible aspiration,  started on antibiotics.  Assessment & Plan:   Principal Problem:   Atrial fibrillation with RVR ([place]) Active Problems:   Acute lower UTI   Community acquired pneumonia   Supratherapeutic INR   Candidiasis of penis   Stage 1 skin ulcer of sacral region [facility])   Bladder cancer ([place])  UTI due to chronic indwelling urinary catheter: History of bladder cancer: He failed outpatient therapy with Macrobid, presented with dysuria and abdominal pain. Patient was started on cefepime, urine culture contaminated. He was found to have genital erythema with concern for Fournier's gangrene, urology was consulted. This is unlikely and physical exam and imaging most likely suggest superficial infection such as fungal, treated with topical clotrimazole.  We will discontinue antibiotics since patient does have chronic Foley catheter.  Follow-up outpatient with Dr. Claudia Desanctis next week. Patient also reports significant bladder cramping, discussed with Dr. Alinda Money,  started patient on Myrbetriq.  If it does not help could potentially try to replace the Foley catheter. Cramping improved. Patient continued to complain about bladder cramping.  Will exchange Foley catheter.  Bilateral pneumonia, possible aspiration: Chest x-ray on admission showed patchy left greater than right basilar opacities concerning for pneumonia. He also  complains of cough but no significant other respiratory symptoms. He has completed cefepime and Zithromax for 5 days. Repeat chest x-ray showed worsening pneumonia possible aspiration. Consider Unasyn for 3 days, speech and swallow eval recommended thick liquids.  Penile candidiasis: Continue clotrimazole, rash is improving.  Chronic atrial fibrillation: Heart rate is well controlled.  Continue metoprolol. Continue Coumadin as per pharmacy. INR was supratherapeutic,  now improved.  Hypothyroidism: Continue levothyroxine and liothyronine.  Hyperlipidemia: Continue statins.  Hypomagnesemia: replaced and improved.  Coccyx pressure injury, stage I, POA Pressure Injury 07/30/21 Coccyx Mid Stage 1 -  Intact skin with non-blanchable redness of a localized area usually over a bony prominence. (Active)  07/30/21 2045  Location: Coccyx  Location Orientation: Mid  Staging: Stage 1 -  Intact skin with non-blanchable redness of a localized area usually over a bony prominence.  Wound Description (Comments):   Present on Admission: Yes   DVT prophylaxis: SCDs Code Status: DNR Family Communication: No family at bed side Disposition Plan:   Status is: Inpatient  Remains inpatient appropriate because: Persistent symptoms.  Consultants:  Urology Wound care  Procedures: None Antimicrobials:  Anti-infectives (From admission, onward)    Start     Dose/Rate Route Frequency Ordered Stop   08/04/21 1400  ampicillin-sulbactam (UNASYN) 1.5 g in sodium chloride 0.9 % 100 mL IVPB        1.5 g 200 mL/hr over 30 Minutes Intravenous Every 6 hours 08/04/21 1327 08/07/21 1559   07/31/21 2200  ceFEPIme (MAXIPIME) 1 g in sodium chloride 0.9 % 100 mL IVPB  Status:  Discontinued        1 g 200 mL/hr over 30 Minutes Intravenous Every 12 hours 07/31/21 1147  07/31/21 1150   07/31/21 2200  ceFEPIme (MAXIPIME) 2 g in sodium chloride 0.9 % 100 mL IVPB  Status:  Discontinued        2 g 200 mL/hr over 30  Minutes Intravenous Every 12 hours 07/31/21 1150 08/03/21 0712   07/30/21 2200  azithromycin (ZITHROMAX) 500 mg in sodium chloride 0.9 % 250 mL IVPB  Status:  Discontinued        500 mg 250 mL/hr over 60 Minutes Intravenous Every 24 hours 07/29/21 2156 08/03/21 0712   07/30/21 0400  ceFEPIme (MAXIPIME) 2 g in sodium chloride 0.9 % 100 mL IVPB  Status:  Discontinued        2 g 200 mL/hr over 30 Minutes Intravenous Every 24 hours 07/29/21 2313 07/31/21 1147   07/29/21 2015  cefTRIAXone (ROCEPHIN) 1 g in sodium chloride 0.9 % 100 mL IVPB        1 g 200 mL/hr over 30 Minutes Intravenous  Once 07/29/21 2000 07/29/21 2039   07/29/21 2015  azithromycin (ZITHROMAX) 500 mg in sodium chloride 0.9 % 250 mL IVPB        500 mg 250 mL/hr over 60 Minutes Intravenous  Once 07/29/21 2000 07/29/21 2114       Subjective: Patient was seen and examined at bedside.  Overnight events noted.   Patient is very hard of hearing.  Patient communicates by writing on the board. Patient still complains of having intermittent penile pain.  Objective: Vitals:   08/04/21 0600 08/04/21 0630 08/04/21 1024 08/04/21 2129  BP:  (!) 99/54 (!) 104/55 117/72  Pulse:  64 80 67  Resp: 17 18 19 20   Temp:  98.4 F (36.9 C) 97.8 F (36.6 C) 98 F (36.7 C)  TempSrc:  Oral Oral Oral  SpO2:  95% 100% 96%  Weight:      Height:        Intake/Output Summary (Last 24 hours) at 08/05/2021 1145 Last data filed at 08/05/2021 0600 Gross per 24 hour  Intake 419.87 ml  Output 950 ml  Net -530.13 ml   Filed Weights   07/29/21 1856  Weight: 65.8 kg    Examination:  General exam: Appears very deconditioned, not in any acute distress, comfortable. Respiratory system: Clear to auscultation bilaterally, respiratory effort normal, RR 15 Cardiovascular system: S1 & S2 heard, rate controlled,  Irregular rhythm, no murmur.   Gastrointestinal system: Abdomen is soft, nontender, non distended, BS+ Central nervous system: Alert and  oriented x 2. No focal neurological deficits. Extremities: No edema, no cyanosis, no clubbing. Skin: No rashes, lesions or ulcers Psychiatry: . Mood & affect appropriate.     Data Reviewed: I have personally reviewed following labs and imaging studies  CBC: Recent Labs  Lab 07/29/21 2000 07/30/21 0650 07/31/21 0325 08/01/21 0316 08/02/21 0328 08/03/21 0326  WBC 14.7* 10.0 9.2 9.8 9.5 9.1  NEUTROABS 11.8*  --  5.8  --   --   --   HGB 13.4 12.5* 11.6* 10.7* 10.6* 10.9*  HCT 40.2 37.3* 36.4* 33.0* 32.7* 33.2*  MCV 108.9* 110.0* 112.3* 111.1* 111.6* 111.0*  PLT 204 192 188 175 163 761   Basic Metabolic Panel: Recent Labs  Lab 07/29/21 2000 07/31/21 0325 08/01/21 0316 08/02/21 0328 08/03/21 0326  NA 137 140 139 138 138  K 3.9 3.6 3.8 3.6 4.0  CL 100 108 109 106 108  CO2 26 27 27 27 25   GLUCOSE 152* 78 90 96 97  BUN 41* 22 22 17  19  CREATININE 1.11 0.71 0.79 0.84 0.92  CALCIUM 11.0* 9.6 9.1 9.1 9.3  MG  --  1.6* 1.8  --  1.7  PHOS  --   --   --   --  2.7   GFR: Estimated Creatinine Clearance: 41.7 mL/min (by C-G formula based on SCr of 0.92 mg/dL). Liver Function Tests: Recent Labs  Lab 07/29/21 2000 08/01/21 0316  AST 31 26  ALT 20 17  ALKPHOS 79 53  BILITOT 1.2 0.8  PROT 7.6 5.3*  ALBUMIN 3.9 2.8*   No results for input(s): LIPASE, AMYLASE in the last 168 hours. No results for input(s): AMMONIA in the last 168 hours. Coagulation Profile: Recent Labs  Lab 08/01/21 0316 08/02/21 0328 08/03/21 0326 08/04/21 0339 08/05/21 0340  INR 1.6* 2.1* 2.9* 2.9* 2.6*   Cardiac Enzymes: No results for input(s): CKTOTAL, CKMB, CKMBINDEX, TROPONINI in the last 168 hours. BNP (last 3 results) No results for input(s): PROBNP in the last 8760 hours. HbA1C: No results for input(s): HGBA1C in the last 72 hours. CBG: No results for input(s): GLUCAP in the last 168 hours. Lipid Profile: No results for input(s): CHOL, HDL, LDLCALC, TRIG, CHOLHDL, LDLDIRECT in the last  72 hours. Thyroid Function Tests: No results for input(s): TSH, T4TOTAL, FREET4, T3FREE, THYROIDAB in the last 72 hours. Anemia Panel: No results for input(s): VITAMINB12, FOLATE, FERRITIN, TIBC, IRON, RETICCTPCT in the last 72 hours.  Sepsis Labs: Recent Labs  Lab 07/29/21 2000 07/31/21 0325 08/01/21 0316 08/04/21 0339  PROCALCITON  --  0.24 0.16 0.16  LATICACIDVEN 1.8  --   --   --     Recent Results (from the past 240 hour(s))  Culture, blood (Routine x 2)     Status: None   Collection Time: 07/29/21  7:55 PM   Specimen: BLOOD  Result Value Ref Range Status   Specimen Description   Final    BLOOD BLOOD LEFT FOREARM Performed at [facility] [address], [place], [place] [address]    Special Requests   Final    BOTTLES DRAWN AEROBIC AND ANAEROBIC Blood Culture results may not be optimal due to an excessive volume of blood received in culture bottles Performed at [facility] [address] [place], [place] [address]    Culture   Final    NO GROWTH 5 DAYS Performed at [facility], [place] [address] [place], [place] [address]    Report Status 08/03/2021 FINAL  Final  Culture, blood (Routine x 2)     Status: None   Collection Time: 07/29/21  8:00 PM   Specimen: BLOOD  Result Value Ref Range Status   Specimen Description   Final    BLOOD LEFT ANTECUBITAL Performed at [facility] [address] [place], [place] [address]    Special Requests   Final    BOTTLES DRAWN AEROBIC AND ANAEROBIC Blood Culture adequate volume Performed at [facility] [address], [place], [place] [address]    Culture   Final    NO GROWTH 5 DAYS Performed at [facility], [place] [address], [place], [place] [address]    Report Status 08/03/2021 FINAL  Final  Urine Culture     Status: Abnormal   Collection Time: 07/29/21  8:14 PM   Specimen: Urine, Clean Catch  Result Value Ref Range Status    Specimen Description   Final    URINE, CLEAN CATCH Performed at [facility], [place] [address] [place], [place] [address]    Special Requests   Final    NONE Performed at [facility], [place] [address], [place], [place] [address]    Culture MULTIPLE SPECIES PRESENT, SUGGEST RECOLLECTION (A)  Final   Report Status 07/31/2021 FINAL  Final  Resp Panel by RT-PCR (Flu A&B, Covid) Urine, Clean Catch     Status: None   Collection Time: 07/29/21  9:15 PM   Specimen: Urine, Clean Catch; Nasopharyngeal(NP) swabs in vial transport medium  Result Value Ref Range Status   SARS Coronavirus 2 by RT PCR NEGATIVE NEGATIVE Final    Comment: (NOTE) SARS-CoV-2 target nucleic acids are NOT DETECTED.  The SARS-CoV-2 RNA is generally detectable in upper respiratory specimens during the acute phase of infection. The lowest concentration of SARS-CoV-2 viral copies this assay can detect is 138 copies/mL. A negative result does not preclude SARS-Cov-2 infection and should not be used as the sole basis for treatment or other patient management decisions. A negative result may occur with  improper specimen collection/handling, submission of specimen other than nasopharyngeal swab, presence of viral mutation(s) within the areas targeted by this assay, and inadequate number of viral copies(<138 copies/mL). A negative result must be combined with clinical observations, patient history, and epidemiological information. The expected result is Negative.  Fact Sheet for Patients:  EntrepreneurPulse.com.au  Fact Sheet for Healthcare Providers:  IncredibleEmployment.be  This test is no t yet approved or cleared by the [place] FDA and  has been authorized for detection and/or diagnosis of SARS-CoV-2 by FDA under an Emergency Use Authorization (EUA). This EUA will remain  in effect (meaning this test can be used) for the duration of  the COVID-19 declaration under Section 564(b)(1) of the Act, 21 U.S.C.section 360bbb-3(b)(1), unless the authorization is terminated  or revoked sooner.       Influenza A by PCR NEGATIVE NEGATIVE Final   Influenza B by PCR NEGATIVE NEGATIVE Final    Comment: (NOTE) The Xpert Xpress SARS-CoV-2/FLU/RSV plus assay is intended as an aid in the diagnosis of influenza from Nasopharyngeal swab specimens and should not be used as a sole basis for treatment. Nasal washings and aspirates are unacceptable for Xpert Xpress SARS-CoV-2/FLU/RSV testing.  Fact Sheet for Patients: EntrepreneurPulse.com.au  Fact Sheet for Healthcare Providers: IncredibleEmployment.be  This test is not yet approved or cleared by the [place] FDA and has been authorized for detection and/or diagnosis of SARS-CoV-2 by FDA under an Emergency Use Authorization (EUA). This EUA will remain in effect (meaning this test can be used) for the duration of the COVID-19 declaration under Section 564(b)(1) of the Act, 21 U.S.C. section 360bbb-3(b)(1), unless the authorization is terminated or revoked.  Performed at [facility], [place] [address], [place], [place] [address]     Radiology Studies: DG Swallowing Func-Speech Pathology  Result Date: 08/05/2021 Table formatting from the original result was not included. Objective Swallowing Evaluation: Type of Study: MBS-Modified Barium Swallow Study  Patient Details Name: Craig Snow MRN: [id number] Date of Birth: 1920-09-29 Today's Date: 08/05/2021 Time: SLP Start Time (ACUTE ONLY): 0825 -SLP Stop Time (ACUTE ONLY): 3500 SLP Time Calculation (min) (ACUTE ONLY): 30 min Past Medical History: Past Medical History: Diagnosis Date  Atrial fibrillation ([place])   Hyperlipidemia   Hypertension   Thyrotoxicosis  Past Surgical History: No past surgical history on file. HPI: pt isa 88 yo male adm to [facility] with penile discomfort - found to have  low BP and  CXR indicating pna. 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 Pt recently moved to [place] and stays at [facility].  Swallow eval ordered. He denies having prior swallow evaluations.  Pt has been in the hospital for six days and CXR now concerning for pna.  He underwent BSE and has h/o known aspiration. Initially decided he did not want swallow test in xray - however after MD reordered evaluation - Pt agreeable to proceed with instrumental evaluation.  Subjective: pt awake in chair  Recommendations for follow up therapy are one component of a multi-disciplinary discharge planning process, led by the attending physician.  Recommendations may be updated based on patient status, additional functional criteria and insurance authorization. Assessment / Plan / Recommendation Clinical Impressions 08/05/2021 Clinical Impression Patient presents with severe oropharyngeal=cervical esophageal dysphagia due to iatrogenic effects of head and neck cancer.  Decreased oral propulsive abilities and impaired mastication due to palatal surgery noted resulting in delayed swallow, premature spillage and retention of pudding on uvula. Pt does piecemeal and suspect this is compensation.  Pharyngeal and cervical esophageal swallow marked by impaired motiltiy resulting in diminished clearance of boluses - even secretions-, and impaired airway closure.  These above factors result in gross retention and significant aspiration of thin - even with head of chair reclined.  Aspiration occurs both during swallow and after due to impaired laryngeal closure and also from pyriform sinus retention spilling into open airway with cued dry swallow.  Pt had subtle cough with aspiration x1 with large amount but this was not effective for airway clearance.  Silent aspiration note with less aspiration.  Postures and compensation strategies including tiled head of chair to approx 45* with neck  extended did assist with boluses being retained in pharynx and slightly thicker *nectar* liquids were retained due to improved viscocity.  Dry swallows, following solids with liquids. cough and expectorate helped maximize airway protection.  SLP suspects pt's dysphagia may be contributing to his weight loss and deconditioning.  Given level of dysphagia, pt has been aspirating chronically for years and able to manage.  Purpose of MBS was to determine if specific strategies may be helpful. Pt will have ongoing aspiration but mitigation strategies help to improve airway protection.  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  Will follow briefly to support pt's use of compensations for airway protection and comfort. SLP Visit Diagnosis Dysphagia, pharyngoesophageal phase (R13.14);Dysphagia, oropharyngeal phase (R13.12) Attention and concentration deficit following -- Frontal lobe and executive function deficit following -- Impact on safety and function Severe aspiration risk;Risk for inadequate nutrition/hydration   Treatment Recommendations 07/30/2021 Treatment Recommendations F/U MBS in --- days (Comment);Other (Comment)   Prognosis 08/05/2021 Prognosis for Safe Diet [place] Fair Barriers to Reach Goals Time post onset Barriers/Prognosis Comment --  SLP Diet Recommendations: Nectar thick liquid (frazier water protocol)  Liquid Administration via: Cup;No straw;Spoon  Medication Administration: Crushed with puree; start and follow with nectar  Supervision: Patient able to self feed  Compensations: Slow rate;Small sips/bites;Multiple dry swallows after each bite/sip;Follow solids with liquid;Other (Comment) ("cough and hock" intermittently), Effortful swallow  Postural Changes: Other (Comment) Head of bed reclined to approx 45* with all po  Oral Care Recommendations: Oral care QID  Other Recommendations: Order thickener from pharmacy;Have oral suction available Diet Recommendations 08/05/2021 SLP  Diet Recommendations Nectar thick liquid Liquid Administration via Cup;No straw;Spoon Medication Administration Crushed with puree Compensations Slow rate;Small sips/bites;Multiple dry swallows after  each bite/sip;Follow solids with liquid;Other (Comment) Postural Changes Other (Comment)   Other Recommendations 08/05/2021 Recommended Consults -- Oral Care Recommendations Oral care QID Other Recommendations Order thickener from pharmacy;Have oral suction available Follow Up Recommendations Skilled nursing-short term rehab (<3 hours/day) Assistance recommended at discharge Intermittent Supervision/Assistance Functional Status Assessment Patient has had a recent decline in their functional status and demonstrates the ability to make significant improvements in function in a reasonable and predictable amount of time. Frequency and Duration  08/05/2021 Speech Therapy Frequency (ACUTE ONLY) min 1 x/week Treatment Duration 1 week   Oral Phase 08/05/2021 Oral Phase Impaired Oral - Pudding Teaspoon -- Oral - Pudding Cup -- Oral - Honey Teaspoon -- Oral - Honey Cup -- Oral - Nectar Teaspoon Weak lingual manipulation;Piecemeal swallowing;Lingual pumping;Reduced posterior propulsion Oral - Nectar Cup Premature spillage;Piecemeal swallowing;Reduced posterior propulsion;Lingual pumping;Weak lingual manipulation;Decreased bolus cohesion Oral - Nectar Straw -- Oral - Thin Teaspoon Premature spillage;Weak lingual manipulation;Piecemeal swallowing;Reduced posterior propulsion;Lingual pumping;Decreased bolus cohesion Oral - Thin Cup Premature spillage;Weak lingual manipulation;Piecemeal swallowing;Reduced posterior propulsion;Lingual pumping;Decreased bolus cohesion Oral - Thin Straw -- Oral - Puree Premature spillage;Weak lingual manipulation;Delayed oral transit;Reduced posterior propulsion Oral - Mech Soft Premature spillage;Weak lingual manipulation;Delayed oral transit;Reduced posterior propulsion Oral - Regular -- Oral -  Multi-Consistency -- Oral - Pill -- Oral Phase - Comment significantly impaired mastication of cracker noted as pt states "I don't have teeth to chew that" - essentially swallowed "whole" , piecemealing appears to be compensatory for pt; cracker and puree spills to pyriform sinus without adequate mastication  Pharyngeal Phase 08/05/2021 Pharyngeal Phase Impaired Pharyngeal- Pudding Teaspoon -- Pharyngeal -- Pharyngeal- Pudding Cup -- Pharyngeal -- Pharyngeal- Honey Teaspoon -- Pharyngeal -- Pharyngeal- Honey Cup -- Pharyngeal -- Pharyngeal- Nectar Teaspoon Reduced pharyngeal peristalsis;Reduced epiglottic inversion;Reduced anterior laryngeal mobility;Reduced laryngeal elevation;Reduced airway/laryngeal closure;Reduced tongue base retraction;Pharyngeal residue - valleculae;Pharyngeal residue - pyriform;Lateral channel residue Pharyngeal Material does not enter airway Pharyngeal- Nectar Cup Reduced pharyngeal peristalsis;Reduced epiglottic inversion;Reduced anterior laryngeal mobility;Reduced laryngeal elevation;Reduced airway/laryngeal closure;Reduced tongue base retraction;Pharyngeal residue - pyriform;Pharyngeal residue - valleculae;Penetration/Aspiration before swallow;Penetration/Apiration after swallow Pharyngeal Material enters airway, remains ABOVE vocal cords and not ejected out;Material enters airway, CONTACTS cords and not ejected out Pharyngeal- Nectar Straw -- Pharyngeal -- Pharyngeal- Thin Teaspoon Reduced pharyngeal peristalsis;Reduced epiglottic inversion;Reduced anterior laryngeal mobility;Reduced laryngeal elevation;Reduced airway/laryngeal closure;Reduced tongue base retraction;Penetration/Apiration after swallow;Penetration/Aspiration during swallow;Trace aspiration Pharyngeal Material enters airway, passes BELOW cords without attempt by patient to eject out (silent aspiration) Pharyngeal- Thin Cup Pharyngeal residue - pyriform;Pharyngeal residue - valleculae;Lateral channel residue;Reduced  airway/laryngeal closure;Reduced laryngeal elevation;Reduced anterior laryngeal mobility;Reduced epiglottic inversion;Reduced pharyngeal peristalsis Pharyngeal Material enters airway, passes BELOW cords without attempt by patient to eject out (silent aspiration);Material enters airway, passes BELOW cords and not ejected out despite cough attempt by patient Pharyngeal- Thin Straw -- Pharyngeal -- Pharyngeal- Puree Reduced pharyngeal peristalsis;Reduced epiglottic inversion;Pharyngeal residue - pyriform;Reduced laryngeal elevation;Reduced anterior laryngeal mobility;Reduced airway/laryngeal closure;Reduced tongue base retraction Pharyngeal Material does not enter airway Pharyngeal- Mechanical Soft Reduced pharyngeal peristalsis;Reduced epiglottic inversion;Reduced anterior laryngeal mobility;Reduced laryngeal elevation;Reduced airway/laryngeal closure;Reduced tongue base retraction;Penetration/Aspiration before swallow;Penetration/Aspiration during swallow;Pharyngeal residue - pyriform Pharyngeal Material does not enter airway Pharyngeal- Regular -- Pharyngeal -- Pharyngeal- Multi-consistency -- Pharyngeal -- Pharyngeal- Pill -- Pharyngeal -- Pharyngeal Comment --  Cervical Esophageal Phase  08/05/2021 Cervical Esophageal Phase Impaired Pudding Teaspoon -- Pudding Cup -- Honey Teaspoon -- Honey Cup -- Nectar Teaspoon -- Nectar Cup -- Nectar Straw -- Thin Teaspoon -- Thin Cup -- Thin Straw -- Puree -- Mechanical Soft --  Regular -- Multi-consistency -- Pill -- Cervical Esophageal Comment Pt appeared with retention at pyriform sinus/UES - due to iatrogenic effects of XRT and also obstructive due to appearance of cervical osteophyte at C5-C6, Poor UES opening also allows retention - UES contraction improved with heavier bolus Kathleen Lime, MS [facility] SLP Acute Rehab Services Office [phone] Cell [phone] Macario Golds 08/05/2021, 9:46 AM                      Scheduled Meds:  atorvastatin  10 mg Oral QHS    chlorhexidine  15 mL Mouth Rinse BID   Chlorhexidine Gluconate Cloth  6 each Topical Daily   clotrimazole   Topical BID   levothyroxine  50 mcg Oral q morning   liothyronine  5 mcg Oral q morning   mouth rinse  15 mL Mouth Rinse q12n4p   metoprolol tartrate  25 mg Oral BID   mirabegron ER  25 mg Oral Daily   mirtazapine  15 mg Oral QHS   pantoprazole  40 mg Oral q morning   polyethylene glycol  17 g Oral q morning   senna-docusate  1 tablet Oral BID   tamsulosin  0.4 mg Oral q morning   warfarin  2 mg Oral ONCE-1600   Warfarin - Pharmacist Dosing Inpatient   Does not apply q1600   Continuous Infusions:  ampicillin-sulbactam (UNASYN) IV 1.5 g (08/05/21 0410)      LOS: 7 days    Time spent: 35 mins    Keely Drennan, MD [facility]   If 7PM-7AM, please contact night-coverage

## 2021-08-05 NOTE — Progress Notes (Signed)
Speech Language Pathology Treatment: Dysphagia  Patient Details Name: Craig Snow MRN: [id number] DOB: Dec 18, 1920 Today's Date: 08/05/2021 Time: 7672-0947 SLP Time Calculation (min) (ACUTE ONLY): 35 min  Assessment / Plan / Recommendation Clinical Impression  Session focused on detailed review of MBS and clinical reasoning for compensation strategies.  Pt requires extra time due to his hearing loss.  Reviewed MBS in detail - showing him 2 flouro loops to help aid understanding.  Explained clinical reasoning for water protocol to help hydration and QOL.  Advised pt that he will continue to aspiration but strategies are to help minimize aspiration as much as able.    Using teach back and demonstration of strategies - pt able to return demonstration of bed position and strategy, pt reports understanding. SLP simplified his swallow strategies for him to follow step by step with strategies.  Will follow up with pt for swallow management.  Recommend consider a palliative consult given pt's level of dysphagia, progressive weight loss, deconditioning and now concern for pna.    HPI HPI: pt isa 88 yo male adm to [facility] with penile discomfort - found to have low BP and CXR indicating pna. 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 Pt recently moved to [place] and stays at [facility].  Swallow eval ordered. He denies having prior swallow evaluations.  Pt has been in the hospital for six days and CXR now concerning for pna.  He underwent BSE and has h/o known aspiration. Initially decided he did not want swallow test in xray - however after MD reordered evaluation - Pt agreeable to proceed with instrumental evaluation.      SLP Plan  Continue with current plan of care      Recommendations for follow up therapy are one component of a multi-disciplinary discharge planning process, led by the attending physician.  Recommendations may be updated based on  patient status, additional functional criteria and insurance authorization.    Recommendations  Diet recommendations: Nectar-thick liquid (thin water between meals) Liquids provided via: Cup Medication Administration: Whole meds with puree Supervision: Patient able to self feed;Intermittent supervision to cue for compensatory strategies Compensations: Slow rate;Small sips/bites;Multiple dry swallows after each bite/sip;Follow solids with liquid;Other (Comment) ("cough and hock" intermittently) Postural Changes and/or Swallow Maneuvers:  (head of bed down approx 45*)                General recommendations: Other(comment) (palliative) Oral Care Recommendations: Oral care before and after PO (pt to clean mouth after meals!) Follow Up Recommendations: Skilled nursing-short term rehab (<3 hours/day) Assistance recommended at discharge: Intermittent Supervision/Assistance SLP Visit Diagnosis: Dysphagia, pharyngoesophageal phase (R13.14);Dysphagia, oropharyngeal phase (R13.12) Plan: Continue with current plan of care         Kathleen Lime, MS [place] Office [phone] Cell [phone]   Macario Golds  08/05/2021, 11:05 AM

## 2021-08-05 NOTE — Progress Notes (Signed)
Patient continues to c/o of intermittent penile pain, lasting "about 5 minutes" at a time.  In between, he has "very little if any" pain, and is "comfortable."    Continues to receive tramadol q6 as available.  When asked if he thought the pain medicine helped, patient responded, "I don't know."  Angie Fava, RN

## 2021-08-06 ENCOUNTER — Inpatient Hospital Stay (HOSPITAL_COMMUNITY): Payer: Medicare Other

## 2021-08-06 LAB — CBC
HCT: 31.2 % — ABNORMAL LOW (ref 39.0–52.0)
Hemoglobin: 10.5 g/dL — ABNORMAL LOW (ref 13.0–17.0)
MCH: 36.8 pg — ABNORMAL HIGH (ref 26.0–34.0)
MCHC: 33.7 g/dL (ref 30.0–36.0)
MCV: 109.5 fL — ABNORMAL HIGH (ref 80.0–100.0)
Platelets: 186 10*3/uL (ref 150–400)
RBC: 2.85 MIL/uL — ABNORMAL LOW (ref 4.22–5.81)
RDW: 12.9 % (ref 11.5–15.5)
WBC: 9.3 10*3/uL (ref 4.0–10.5)
nRBC: 0 % (ref 0.0–0.2)

## 2021-08-06 LAB — PROTIME-INR
INR: 2.7 — ABNORMAL HIGH (ref 0.8–1.2)
Prothrombin Time: 29 seconds — ABNORMAL HIGH (ref 11.4–15.2)

## 2021-08-06 LAB — RESP PANEL BY RT-PCR (FLU A&B, COVID) ARPGX2
Influenza A by PCR: NEGATIVE
Influenza B by PCR: NEGATIVE
SARS Coronavirus 2 by RT PCR: NEGATIVE

## 2021-08-06 MED ORDER — METOPROLOL TARTRATE 25 MG PO TABS: 25.0000 mg | ORAL_TABLET | Freq: Two times a day (BID) | ORAL | 1 refills | Status: DC

## 2021-08-06 MED ORDER — MIRABEGRON ER 25 MG PO TB24: 25.0000 mg | ORAL_TABLET | Freq: Every day | ORAL | 1 refills | Status: DC

## 2021-08-06 MED ORDER — WARFARIN SODIUM 2 MG PO TABS
2.0000 mg | ORAL_TABLET | Freq: Once | ORAL | Status: AC
  Administered 2021-08-06: 2 mg via ORAL
  Filled 2021-08-06: qty 1

## 2021-08-06 NOTE — TOC Progression Note (Addendum)
Transition of Care [facility]) - Progression Note    Patient Details  Name: Craig Snow MRN: [id number] Date of Birth: [birth year]  [facility] of [facility]) CM/SW Contact  Leeroy Cha, RN Phone Number: 08/06/2021, 1:34 PM  Clinical Narrative:    Patient dcd to go to [facility].  Son [name] is aware.  Awaiting dc summary and covid test. DC SUMMARY AND SNF TRANSFER REPORT SENT VIA THE [facility]. Dc packet given to the nurse attending the patient.  Expected Discharge Plan: [place] Barriers to Discharge: Continued Medical Work up  Expected Discharge Plan and Services Expected Discharge Plan: [place] In-house Referral: Clinical Social Work   Post Acute Care Choice: [place] arrangements for the past 2 months: [place] Expected Discharge Date: 08/06/21                                     Social Determinants of Health (SDOH) Interventions    Readmission Risk Interventions No flowsheet data found.

## 2021-08-06 NOTE — Discharge Summary (Addendum)
Physician Discharge Summary  Craig Snow NWG:[id number] DOB: [birth year] DOA: 07/29/2021  PCP: Pcp, No  Admit date: 07/29/2021  Discharge date: 08/06/2021  Admitted From: Home. Disposition:  SNF  Recommendations for Outpatient Follow-up:  Follow up with PCP in 1-2 weeks Please obtain BMP/CBC in one week Advised to follow up with urology Dr. Claudia Desanctis in one week. Patient is being discharged with indwelling Foley catheter. Advised to take Myrbetriq daily.  Home Health:None Equipment/Devices:None  Discharge Condition: Stable CODE STATUS:DNR Diet recommendation: Dysphagia III diet  Brief Summary/ Hospital Course: This 88 year old male with PMH significant of A. fib on Coumadin, remote history of palate cancer, HTN, HLD, recent bladder cancer with chronic Foley who comes in from ALF with increasing penile pain.  He was recently diagnosed with bladder cancer about 4 to 5 months ago, underwent TURP, has chronic indwelling Foley catheter and currently undergoing BCG therapy. He has been having dysuria and lower abdominal pain. He was found to have bilateral pneumonia possible aspiration,  started on antibiotics.  Patient was continued on IV antibiotics , He has completed the course.  Patient had speech and swallow evaluation, recommended dysphagia 3 diet.  Patient continued to have cramping pain in the penis, discussed with urology,  started on Myrbetriq , He tolerated well.  Indwelling Foley catheter was exchanged which resolved the cramping.  Patient feels better , PT recommended SNF.  Patient is being discharged to SNF for rehab.   He was managed for below problem  Discharge Diagnoses:  Principal Problem:   Atrial fibrillation with RVR ([place]) Active Problems:   Acute lower UTI   Community acquired pneumonia   Supratherapeutic INR   Candidiasis of penis   Stage 1 skin ulcer of sacral region [facility])   Bladder cancer ([place])  UTI due to chronic indwelling urinary catheter: History of bladder  cancer: He failed outpatient therapy with Macrobid, presented with dysuria and abdominal pain. Patient was started on cefepime, urine culture contaminated. He was found to have genital erythema with concern for Fournier's gangrene, urology was consulted. This is unlikely and physical exam and imaging most likely suggest superficial infection such as fungal, treated with topical clotrimazole.  We will discontinue antibiotics since patient does have chronic Foley catheter.  Follow-up outpatient with Dr. Claudia Desanctis next week. Patient also reports significant bladder cramping, discussed with Dr. Alinda Money,  started patient on Myrbetriq.  If it does not help could potentially try to replace the Foley catheter. Cramping improved. Patient continued to complain about bladder cramping which resolved after exchange of Foley catheter.   Bilateral pneumonia, possible aspiration: Chest x-ray on admission showed patchy left greater than right basilar opacities concerning for pneumonia. He also complains of cough but no significant other respiratory symptoms. He has completed cefepime and Zithromax for 5 days. Repeat chest x-ray showed worsening pneumonia possible aspiration. Patient completed Unasyn for 3 more days.  Speech and swallow recommended dysphagia 3 diet.   Penile candidiasis: Continue clotrimazole, rash has improved.   Chronic atrial fibrillation: Heart rate is well controlled.  Continue metoprolol. Continue Coumadin as per pharmacy. INR was supratherapeutic,  now improved.   Hypothyroidism: Continue levothyroxine and liothyronine.   Hyperlipidemia: Continue statins.   Hypomagnesemia: replaced and improved.   Coccyx pressure injury, stage I, POA Pressure Injury 07/30/21 Coccyx Mid Stage 1 -  Intact skin with non-blanchable redness of a localized area usually over a bony prominence. (Active)  07/30/21 2045  Location: Coccyx  Location Orientation: Mid  Staging: Stage 1 -  Intact  skin with  non-blanchable redness of a localized area usually over a bony prominence.  Wound Description (Comments):   Present on Admission: Yes    Discharge Instructions  Discharge Instructions     Call MD for:  difficulty breathing, headache or visual disturbances   Complete by: As directed    Call MD for:  persistant dizziness or light-headedness   Complete by: As directed    Call MD for:  persistant nausea and vomiting   Complete by: As directed    Diet - low sodium heart healthy   Complete by: As directed    Discharge instructions   Complete by: As directed    Advised to follow up with PCP in one week. Advised to follow up Urologist as scheduled. Advised to continue mybriteq daily. Patient has chronic foley catheter.   Discharge wound care:   Complete by: As directed    Follow up Wound care at Nursing home.   Increase activity slowly   Complete by: As directed       Allergies as of 08/06/2021       Reactions   Aspirin Other (See Comments)   Unknown reaction - listed on [facility] 07/30/21   Clindamycin/lincomycin Other (See Comments)   Unknown reaction - listed on [facility] 07/30/21   Other Other (See Comments)   Unknown reaction to opioids - listed on [facility] 07/30/21   Sulfa Antibiotics Other (See Comments)   Unknown reaction - listed on [facility] 07/30/21        Medication List     STOP taking these medications    nitrofurantoin 100 MG capsule Commonly known as: MACRODANTIN       TAKE these medications    acetaminophen 500 MG tablet Commonly known as: TYLENOL Take 1,000 mg by mouth every 6 (six) hours as needed (pain).   atorvastatin 10 MG tablet Commonly known as: LIPITOR Take 10 mg by mouth at bedtime.   COLACE PO Take 2 capsules by mouth daily as needed (constipation).   furosemide 20 MG tablet Commonly known as: LASIX Take 20 mg by mouth every morning.   levothyroxine 50 MCG tablet Commonly known as: SYNTHROID Take 50 mcg by mouth every morning.   liothyronine 5 MCG  tablet Commonly known as: CYTOMEL Take 5 mcg by mouth every morning.   Melatonin 10 MG Tabs Take 10 mg by mouth at bedtime as needed (sleep).   metoprolol tartrate 25 MG tablet Commonly known as: LOPRESSOR Take 1 tablet (25 mg total) by mouth 2 (two) times daily. What changed:  medication strength how much to take when to take this   mirabegron ER 25 MG Tb24 tablet Commonly known as: MYRBETRIQ Take 1 tablet (25 mg total) by mouth daily. Start taking on: August 07, 2021   mirtazapine 15 MG tablet Commonly known as: REMERON Take 15 mg by mouth at bedtime.   pantoprazole 40 MG tablet Commonly known as: PROTONIX Take 40 mg by mouth every morning.   phenazopyridine 200 MG tablet Commonly known as: PYRIDIUM Take 200 mg by mouth every 8 (eight) hours as needed for pain.   polyethylene glycol 17 g packet Commonly known as: MIRALAX / GLYCOLAX Take 17 g by mouth every morning.   potassium chloride 10 MEQ tablet Commonly known as: KLOR-CON M Take 10 mEq by mouth every morning.   tamsulosin 0.4 MG Caps capsule Commonly known as: FLOMAX Take 0.4 mg by mouth every morning.   traMADol 50 MG tablet Commonly known as: ULTRAM Take 50  mg by mouth every 8 (eight) hours as needed for moderate pain or severe pain.   warfarin 2 MG tablet Commonly known as: COUMADIN Take 2 mg by mouth See admin instructions. Take one tablet (2 mg) by mouth in the morning on Sunday and Tuesday morning (take 3 mg on Monday, Wednesday, Thursday, Friday)   warfarin 3 MG tablet Commonly known as: COUMADIN Take 3 mg by mouth See admin instructions. Take one tablet (3 mg) by mouth on Monday, Wednesday, Thursday, Friday morning (take 2 mg on Sunday and Tuesday)               Discharge Care Instructions  (From admission, onward)           Start     Ordered   08/06/21 0000  Discharge wound care:       Comments: Follow up Wound care at Nursing home.   08/06/21 1121            Follow-up  Information     Robley Fries, MD Follow up in 1 week(s).   Specialty: Urology Contact information: [address] [place] [address] [phone]                Allergies  Allergen Reactions   Aspirin Other (See Comments)    Unknown reaction - listed on [facility] 07/30/21   Clindamycin/Lincomycin Other (See Comments)    Unknown reaction - listed on [facility] 07/30/21   Other Other (See Comments)    Unknown reaction to opioids - listed on [facility] 07/30/21   Sulfa Antibiotics Other (See Comments)    Unknown reaction - listed on [facility] 07/30/21    Consultations: Urology   Procedures/Studies: DG Chest 2 View  Result Date: 07/29/2021 CLINICAL DATA:  Suspected Sepsis Lethargy.  Fever. EXAM: CHEST - 2 VIEW COMPARISON:  None. FINDINGS: The heart is normal in size. Normal mediastinal contours. Aortic atherosclerosis. Patchy left greater than right basilar opacities with increased retrocardiac density on the lateral view. No pulmonary edema, pneumothorax, or significant pleural effusion. No acute osseous abnormalities are seen IMPRESSION: Patchy left greater than right basilar opacities concerning for pneumonia in the setting of fever. Electronically Signed   By: Keith Rake M.D.   On: 07/29/2021 19:35   CT ABDOMEN PELVIS W CONTRAST  Result Date: 07/30/2021 CLINICAL DATA:  Complicated urinary tract infection. Clinical concern for Fournier's gangrene. Chronic low pelvic pain. History of bladder cancer. EXAM: CT ABDOMEN AND PELVIS WITH CONTRAST TECHNIQUE: Multidetector CT imaging of the abdomen and pelvis was performed using the standard protocol following bolus administration of intravenous contrast. RADIATION DOSE REDUCTION: This exam was performed according to the departmental dose-optimization program which includes automated exposure control, adjustment of the mA and/or kV according to patient size and/or use of iterative reconstruction technique. CONTRAST:  142mL OMNIPAQUE IOHEXOL  300 MG/ML  SOLN COMPARISON:  Chest radiographs 07/29/2021. FINDINGS: Lower chest: Images through the lung bases are degraded by breathing artifact. There are patchy dependent ground-glass opacities at both lung bases which remain suspicious for infection. There is a moderate size hiatal hernia. Atherosclerosis of the aorta and coronary arteries noted. No significant pleural or pericardial effusion. Hepatobiliary: The liver is normal in density without suspicious focal abnormality. No evidence of gallstones, gallbladder wall thickening or biliary dilatation. Pancreas: Diffusely atrophied. No evidence of focal mass lesion, ductal dilatation or surrounding inflammation. Spleen: Normal in size without focal abnormality. Adrenals/Urinary Tract: Both adrenal glands appear normal. Mild renal cortical thinning bilaterally.  No evidence of hydronephrosis, renal or ureteral calculus. A Foley catheter is in place with non dependent air in the bladder. There is irregular bladder wall thickening without surrounding inflammation. Dependently in the right side of the bladder, there is a 7 mm calcification on image 76/2, likely a bladder calculus. Stomach/Bowel: No enteric contrast administered. As above, there is a moderate size hiatal hernia. The stomach otherwise appears unremarkable for its degree of distention. No evidence of bowel wall thickening, significant distention or surrounding inflammation. There is a diverticulum of the 2nd portion of the duodenum. The appendix appears normal. There are mild diverticular changes and mildly prominent stool throughout the colon. A descending colonic anastomosis is noted. Vascular/Lymphatic: There are no enlarged abdominal or pelvic lymph nodes. Diffuse aortic and branch vessel atherosclerosis without evidence of aneurysm or large vessel occlusion. Reproductive: The lower pelvis is partially obscured by beam hardening artifact from the right total hip arthroplasty. The prostate gland is  moderately enlarged. No definite inflammatory changes or soft tissue emphysema identified within the perineum. There may be a very small amount of gas along the posterior aspect of the penis which could be located between the penis and scrotum, especially if the patient is not circumcised. Other: No evidence of abdominal wall mass or hernia. No ascites. Musculoskeletal: No acute or significant osseous findings. There is multilevel lumbar spondylosis associated with a convex right scoliosis. Patient is status post right total hip arthroplasty. No inflammatory changes, fluid collections or soft tissue emphysema identified in the visualized proximal right thigh. IMPRESSION: 1. No definite inflammatory changes identified within the anterior perineum or visualized proximal right thigh. Assessment is mildly limited by artifact from the right total hip arthroplasty. A small amount of gas along the posterior aspect of the penis is not definitely pathologic, but requires clinical correlation. 2. Bladder wall thickening which may correspond with reported urinary tract infection and/or chronic bladder outlet obstruction. Small right-sided bladder calculus. Foley catheter in place. 3. No hydronephrosis or delayed contrast excretion. Mild renal cortical thinning bilaterally. 4. Patchy ground-glass opacities at both lung bases as seen on chest radiographs yesterday, consistent with pneumonia. Radiographic follow up recommended. 5. Additional incidental findings including a moderate size hiatal hernia, diffuse diverticular changes throughout the colon, moderate enlargement of the prostate gland and Aortic Atherosclerosis (ICD10-I70.0). Electronically Signed   By: Richardean Sale M.D.   On: 07/30/2021 12:23   DG CHEST PORT 1 VIEW  Result Date: 08/06/2021 CLINICAL DATA:  Encounter for pneumonia EXAM: PORTABLE CHEST 1 VIEW COMPARISON:  Radiograph 08/03/2021 FINDINGS: Unchanged cardiomediastinal silhouette. There are left  persistent bibasilar airspace opacities, left greater than right. No visible pneumothorax. Bilateral shoulder degenerative changes. No acute osseous abnormality. IMPRESSION: Persistent bibasilar airspace opacities, left greater than right, consistent with pneumonia/possibly aspiration. Electronically Signed   By: Maurine Simmering M.D.   On: 08/06/2021 09:36   DG CHEST PORT 1 VIEW  Result Date: 08/03/2021 CLINICAL DATA:  Follow up pneumonia. EXAM: PORTABLE CHEST 1 VIEW COMPARISON:  Chest radiographs 07/29/2021.  Abdominal CT 07/30/2021. FINDINGS: 0825 hours. Two views were obtained. The heart size and mediastinal contours are stable with aortic atherosclerosis. Patchy left greater than right basilar airspace opacities have mildly progressed in the interval. There may be a small amount of pleural fluid on the left. No evidence of pneumothorax. The bones appear unremarkable. Telemetry leads overlie the chest. IMPRESSION: Interval worsening of left greater than right basilar airspace opacities suspicious for progressive pneumonia, possibly on the basis of aspiration. Electronically  Signed   By: Richardean Sale M.D.   On: 08/03/2021 09:02   DG Swallowing Func-Speech Pathology  Result Date: 08/05/2021 Table formatting from the original result was not included. Objective Swallowing Evaluation: Type of Study: MBS-Modified Barium Swallow Study  Patient Details Name: Evelyn Moch MRN: [id number] Date of Birth: [birth year] Today's Date: 08/05/2021 Time: SLP Start Time (ACUTE ONLY): 0825 -SLP Stop Time (ACUTE ONLY): 9373 SLP Time Calculation (min) (ACUTE ONLY): 30 min Past Medical History: Past Medical History: Diagnosis Date  Atrial fibrillation ([place])   Hyperlipidemia   Hypertension   Thyrotoxicosis  Past Surgical History: No past surgical history on file. HPI: pt isa 88 yo male adm to [facility] with penile discomfort - found to have low BP and CXR indicating pna. PMH + for bladder cancer, palatal cancer s/p surgery with an obturator  and chemoradiation approx 15 years ago. Pt recently moved to [place] and stays at [facility].  Swallow eval ordered. He denies having prior swallow evaluations.  Pt has been in the hospital for six days and CXR now concerning for pna.  He underwent BSE and has h/o known aspiration. Initially decided he did not want swallow test in xray - however after MD reordered evaluation - Pt agreeable to proceed with instrumental evaluation.  Subjective: pt awake in chair  Recommendations for follow up therapy are one component of a multi-disciplinary discharge planning process, led by the attending physician.  Recommendations may be updated based on patient status, additional functional criteria and insurance authorization. Assessment / Plan / Recommendation Clinical Impressions 08/05/2021 Clinical Impression Patient presents with severe oropharyngeal=cervical esophageal dysphagia due to iatrogenic effects of head and neck cancer.  Decreased oral propulsive abilities and impaired mastication due to palatal surgery noted resulting in delayed swallow, premature spillage and retention of pudding on uvula. Pt does piecemeal and suspect this is compensation.  Pharyngeal and cervical esophageal swallow marked by impaired motiltiy resulting in diminished clearance of boluses - even secretions-, and impaired airway closure.  These above factors result in gross retention and significant aspiration of thin - even with head of chair reclined.  Aspiration occurs both during swallow and after due to impaired laryngeal closure and also from pyriform sinus retention spilling into open airway with cued dry swallow.  Pt had subtle cough with aspiration x1 with large amount but this was not effective for airway clearance.  Silent aspiration note with less aspiration.  Postures and compensation strategies including tiled head of chair to approx 45* with neck extended did assist with boluses being retained in pharynx and slightly thicker *nectar* liquids were  retained due to improved viscocity.  Dry swallows, following solids with liquids. cough and expectorate helped maximize airway protection.  SLP suspects pt's dysphagia may be contributing to his weight loss and deconditioning.  Given level of dysphagia, pt has been aspirating chronically for years and able to manage.  Purpose of MBS was to determine if specific strategies may be helpful. Pt will have ongoing aspiration but mitigation strategies help to improve airway protection.  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  Will follow briefly to support pt's use of compensations for airway protection and comfort. SLP Visit Diagnosis Dysphagia, pharyngoesophageal phase (R13.14);Dysphagia, oropharyngeal phase (R13.12) Attention and concentration deficit following -- Frontal lobe and executive function deficit following -- Impact on safety and function Severe aspiration risk;Risk for inadequate nutrition/hydration   Treatment Recommendations 07/30/2021 Treatment Recommendations F/U MBS in --- days (Comment);Other (Comment)   Prognosis 08/05/2021  Prognosis for Safe Diet Advancement Fair Barriers to Reach Goals Time post onset Barriers/Prognosis Comment --  SLP Diet Recommendations: Nectar thick liquid (frazier water protocol)  Liquid Administration via: Cup;No straw;Spoon  Medication Administration: Crushed with puree; start and follow with nectar  Supervision: Patient able to self feed  Compensations: Slow rate;Small sips/bites;Multiple dry swallows after each bite/sip;Follow solids with liquid;Other (Comment) ("cough and hock" intermittently), Effortful swallow  Postural Changes: Other (Comment) Head of bed reclined to approx 45* with all po  Oral Care Recommendations: Oral care QID  Other Recommendations: Order thickener from pharmacy;Have oral suction available Diet Recommendations 08/05/2021 SLP Diet Recommendations Nectar thick liquid Liquid Administration via Cup;No straw;Spoon Medication  Administration Crushed with puree Compensations Slow rate;Small sips/bites;Multiple dry swallows after each bite/sip;Follow solids with liquid;Other (Comment) Postural Changes Other (Comment)   Other Recommendations 08/05/2021 Recommended Consults -- Oral Care Recommendations Oral care QID Other Recommendations Order thickener from pharmacy;Have oral suction available Follow Up Recommendations Skilled nursing-short term rehab (<3 hours/day) Assistance recommended at discharge Intermittent Supervision/Assistance Functional Status Assessment Patient has had a recent decline in their functional status and demonstrates the ability to make significant improvements in function in a reasonable and predictable amount of time. Frequency and Duration  08/05/2021 Speech Therapy Frequency (ACUTE ONLY) min 1 x/week Treatment Duration 1 week   Oral Phase 08/05/2021 Oral Phase Impaired Oral - Pudding Teaspoon -- Oral - Pudding Cup -- Oral - Honey Teaspoon -- Oral - Honey Cup -- Oral - Nectar Teaspoon Weak lingual manipulation;Piecemeal swallowing;Lingual pumping;Reduced posterior propulsion Oral - Nectar Cup Premature spillage;Piecemeal swallowing;Reduced posterior propulsion;Lingual pumping;Weak lingual manipulation;Decreased bolus cohesion Oral - Nectar Straw -- Oral - Thin Teaspoon Premature spillage;Weak lingual manipulation;Piecemeal swallowing;Reduced posterior propulsion;Lingual pumping;Decreased bolus cohesion Oral - Thin Cup Premature spillage;Weak lingual manipulation;Piecemeal swallowing;Reduced posterior propulsion;Lingual pumping;Decreased bolus cohesion Oral - Thin Straw -- Oral - Puree Premature spillage;Weak lingual manipulation;Delayed oral transit;Reduced posterior propulsion Oral - Mech Soft Premature spillage;Weak lingual manipulation;Delayed oral transit;Reduced posterior propulsion Oral - Regular -- Oral - Multi-Consistency -- Oral - Pill -- Oral Phase - Comment significantly impaired mastication of cracker noted  as pt states "I don't have teeth to chew that" - essentially swallowed "whole" , piecemealing appears to be compensatory for pt; cracker and puree spills to pyriform sinus without adequate mastication  Pharyngeal Phase 08/05/2021 Pharyngeal Phase Impaired Pharyngeal- Pudding Teaspoon -- Pharyngeal -- Pharyngeal- Pudding Cup -- Pharyngeal -- Pharyngeal- Honey Teaspoon -- Pharyngeal -- Pharyngeal- Honey Cup -- Pharyngeal -- Pharyngeal- Nectar Teaspoon Reduced pharyngeal peristalsis;Reduced epiglottic inversion;Reduced anterior laryngeal mobility;Reduced laryngeal elevation;Reduced airway/laryngeal closure;Reduced tongue base retraction;Pharyngeal residue - valleculae;Pharyngeal residue - pyriform;Lateral channel residue Pharyngeal Material does not enter airway Pharyngeal- Nectar Cup Reduced pharyngeal peristalsis;Reduced epiglottic inversion;Reduced anterior laryngeal mobility;Reduced laryngeal elevation;Reduced airway/laryngeal closure;Reduced tongue base retraction;Pharyngeal residue - pyriform;Pharyngeal residue - valleculae;Penetration/Aspiration before swallow;Penetration/Apiration after swallow Pharyngeal Material enters airway, remains ABOVE vocal cords and not ejected out;Material enters airway, CONTACTS cords and not ejected out Pharyngeal- Nectar Straw -- Pharyngeal -- Pharyngeal- Thin Teaspoon Reduced pharyngeal peristalsis;Reduced epiglottic inversion;Reduced anterior laryngeal mobility;Reduced laryngeal elevation;Reduced airway/laryngeal closure;Reduced tongue base retraction;Penetration/Apiration after swallow;Penetration/Aspiration during swallow;Trace aspiration Pharyngeal Material enters airway, passes BELOW cords without attempt by patient to eject out (silent aspiration) Pharyngeal- Thin Cup Pharyngeal residue - pyriform;Pharyngeal residue - valleculae;Lateral channel residue;Reduced airway/laryngeal closure;Reduced laryngeal elevation;Reduced anterior laryngeal mobility;Reduced epiglottic  inversion;Reduced pharyngeal peristalsis Pharyngeal Material enters airway, passes BELOW cords without attempt by patient to eject out (silent aspiration);Material enters airway, passes BELOW cords and not ejected out despite  cough attempt by patient Pharyngeal- Thin Straw -- Pharyngeal -- Pharyngeal- Puree Reduced pharyngeal peristalsis;Reduced epiglottic inversion;Pharyngeal residue - pyriform;Reduced laryngeal elevation;Reduced anterior laryngeal mobility;Reduced airway/laryngeal closure;Reduced tongue base retraction Pharyngeal Material does not enter airway Pharyngeal- Mechanical Soft Reduced pharyngeal peristalsis;Reduced epiglottic inversion;Reduced anterior laryngeal mobility;Reduced laryngeal elevation;Reduced airway/laryngeal closure;Reduced tongue base retraction;Penetration/Aspiration before swallow;Penetration/Aspiration during swallow;Pharyngeal residue - pyriform Pharyngeal Material does not enter airway Pharyngeal- Regular -- Pharyngeal -- Pharyngeal- Multi-consistency -- Pharyngeal -- Pharyngeal- Pill -- Pharyngeal -- Pharyngeal Comment --  Cervical Esophageal Phase  08/05/2021 Cervical Esophageal Phase Impaired Pudding Teaspoon -- Pudding Cup -- Honey Teaspoon -- Honey Cup -- Nectar Teaspoon -- Nectar Cup -- Nectar Straw -- Thin Teaspoon -- Thin Cup -- Thin Straw -- Puree -- Mechanical Soft -- Regular -- Multi-consistency -- Pill -- Cervical Esophageal Comment Pt appeared with retention at pyriform sinus/UES - due to iatrogenic effects of XRT and also obstructive due to appearance of cervical osteophyte at C5-C6, Poor UES opening also allows retention - UES contraction improved with heavier bolus Kathleen Lime, MS [facility] SLP Acute Rehab Services Office [phone] Cell [phone] Macario Golds 08/05/2021, 9:46 AM                     DG HIP PORT UNILAT WITH PELVIS 1V RIGHT  Result Date: 07/29/2021 CLINICAL DATA:  Right-sided groin pain. EXAM: DG HIP (WITH OR WITHOUT PELVIS) 1V PORT RIGHT COMPARISON:   None. 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 The arthroplasty components appear intact and aligned. No acute fracture or dislocation the bones are osteopenic. Vascular calcifications. Degenerative changes of the lower lumbar spine. IMPRESSION: No acute fracture or dislocation. Electronically Signed   By: Anner Crete M.D.   On: 07/29/2021 22:36      Subjective: Patient was seen and examined at bedside.  Overnight events noted.   Patient reports feeling improved, muscular cramping is improved after exchange of Foley catheter.   Patient is being discharged to rehab.  Discharge Exam: Vitals:   08/06/21 1259 08/06/21 1309  BP: (!) 92/45 (!) 107/43  Pulse: 62 73  Resp: 18   Temp: 97.7 F (36.5 C)   SpO2: 95%    Vitals:   08/06/21 0533 08/06/21 0947 08/06/21 1259 08/06/21 1309  BP: (!) 102/51 (!) 105/57 (!) 92/45 (!) 107/43  Pulse: 63 72 62 73  Resp:   18   Temp: 98.1 F (36.7 C)  97.7 F (36.5 C)   TempSrc: Oral  Oral   SpO2: 98%  95%   Weight:      Height:        General: Pt is alert, awake, not in acute distress Cardiovascular: RRR, S1/S2 +, no rubs, no gallops Respiratory: CTA bilaterally, no wheezing, no rhonchi Abdominal: Soft, NT, ND, bowel sounds + Extremities: no edema, no cyanosis    The results of significant diagnostics from this hospitalization (including imaging, microbiology, ancillary and laboratory) are listed below for reference.     Microbiology: Recent Results (from the past 240 hour(s))  Culture, blood (Routine x 2)     Status: None   Collection Time: 07/29/21  7:55 PM   Specimen: BLOOD  Result Value Ref Range Status   Specimen Description   Final    BLOOD BLOOD LEFT FOREARM Performed at [place] [address], [place], [place] [address]    Special Requests   Final    BOTTLES DRAWN AEROBIC AND ANAEROBIC Blood Culture results may not be optimal due to  an excessive volume of blood received in culture  bottles Performed at [place] [address] [place], [place] [address]    Culture   Final    NO GROWTH 5 DAYS Performed at [facility], [place] [address], [place], [place] [address]    Report Status 08/03/2021 FINAL  Final  Culture, blood (Routine x 2)     Status: None   Collection Time: 07/29/21  8:00 PM   Specimen: BLOOD  Result Value Ref Range Status   Specimen Description   Final    BLOOD LEFT ANTECUBITAL Performed at [place] [address] [place], [place] [address]    Special Requests   Final    BOTTLES DRAWN AEROBIC AND ANAEROBIC Blood Culture adequate volume Performed at [place] [address] [place], [place] [address]    Culture   Final    NO GROWTH 5 DAYS Performed at [facility], [place] [address] [place], [place] [address]    Report Status 08/03/2021 FINAL  Final  Urine Culture     Status: Abnormal   Collection Time: 07/29/21  8:14 PM   Specimen: Urine, Clean Catch  Result Value Ref Range Status   Specimen Description   Final    URINE, CLEAN CATCH Performed at [facility], [place] [address] [place], [place] [address]    Special Requests   Final    NONE Performed at [facility], [place] [address], [place],  [address]    Culture MULTIPLE SPECIES PRESENT, SUGGEST RECOLLECTION (A)  Final   Report Status 07/31/2021 FINAL  Final  Resp Panel by RT-PCR (Flu A&B, Covid) Urine, Clean Catch     Status: None   Collection Time: 07/29/21  9:15 PM   Specimen: Urine, Clean Catch; Nasopharyngeal(NP) swabs in vial transport medium  Result Value Ref Range Status   SARS Coronavirus 2 by RT PCR NEGATIVE NEGATIVE Final    Comment: (NOTE) SARS-CoV-2 target nucleic acids are NOT DETECTED.  The SARS-CoV-2 RNA is generally detectable in upper respiratory specimens during the acute phase of infection. The lowest concentration of SARS-CoV-2 viral copies  this assay can detect is 138 copies/mL. A negative result does not preclude SARS-Cov-2 infection and should not be used as the sole basis for treatment or other patient management decisions. A negative result may occur with  improper specimen collection/handling, submission of specimen other than nasopharyngeal swab, presence of viral mutation(s) within the areas targeted by this assay, and inadequate number of viral copies(<138 copies/mL). A negative result must be combined with clinical observations, patient history, and epidemiological information. The expected result is Negative.  Fact Sheet for Patients:  EntrepreneurPulse.com.au  Fact Sheet for Healthcare Providers:  IncredibleEmployment.be  This test is no t yet approved or cleared by the [place] FDA and  has been authorized for detection and/or diagnosis of SARS-CoV-2 by FDA under an Emergency Use Authorization (EUA). This EUA will remain  in effect (meaning this test can be used) for the duration of the COVID-19 declaration under Section 564(b)(1) of the Act, 21 U.S.C.section 360bbb-3(b)(1), unless the authorization is terminated  or revoked sooner.       Influenza A by PCR NEGATIVE NEGATIVE Final   Influenza B by PCR NEGATIVE NEGATIVE Final    Comment: (NOTE) The Xpert Xpress SARS-CoV-2/FLU/RSV plus assay is intended as an aid in the diagnosis of influenza from Nasopharyngeal swab specimens and should not be used as  a sole basis for treatment. Nasal washings and aspirates are unacceptable for Xpert Xpress SARS-CoV-2/FLU/RSV testing.  Fact Sheet for Patients: EntrepreneurPulse.com.au  Fact Sheet for Healthcare Providers: IncredibleEmployment.be  This test is not yet approved or cleared by the [place] FDA and has been authorized for detection and/or diagnosis of SARS-CoV-2 by FDA under an Emergency Use Authorization (EUA). This EUA  will remain in effect (meaning this test can be used) for the duration of the COVID-19 declaration under Section 564(b)(1) of the Act, 21 U.S.C. section 360bbb-3(b)(1), unless the authorization is terminated or revoked.  Performed at [facility], [place] [address] [place], [place] [address]      Labs: BNP (last 3 results) No results for input(s): BNP in the last 8760 hours. Basic Metabolic Panel: Recent Labs  Lab 07/31/21 0325 08/01/21 0316 08/02/21 0328 08/03/21 0326  NA 140 139 138 138  K 3.6 3.8 3.6 4.0  CL 108 109 106 108  CO2 27 27 27 25   GLUCOSE 78 90 96 97  BUN 22 22 17 19   CREATININE 0.71 0.79 0.84 0.92  CALCIUM 9.6 9.1 9.1 9.3  MG 1.6* 1.8  --  1.7  PHOS  --   --   --  2.7   Liver Function Tests: Recent Labs  Lab 08/01/21 0316  AST 26  ALT 17  ALKPHOS 53  BILITOT 0.8  PROT 5.3*  ALBUMIN 2.8*   No results for input(s): LIPASE, AMYLASE in the last 168 hours. No results for input(s): AMMONIA in the last 168 hours. CBC: Recent Labs  Lab 07/31/21 0325 08/01/21 0316 08/02/21 0328 08/03/21 0326 08/06/21 0332  WBC 9.2 9.8 9.5 9.1 9.3  NEUTROABS 5.8  --   --   --   --   HGB 11.6* 10.7* 10.6* 10.9* 10.5*  HCT 36.4* 33.0* 32.7* 33.2* 31.2*  MCV 112.3* 111.1* 111.6* 111.0* 109.5*  PLT 188 175 163 157 186   Cardiac Enzymes: No results for input(s): CKTOTAL, CKMB, CKMBINDEX, TROPONINI in the last 168 hours. BNP: Invalid input(s): POCBNP CBG: No results for input(s): GLUCAP in the last 168 hours. D-Dimer No results for input(s): DDIMER in the last 72 hours. Hgb A1c No results for input(s): HGBA1C in the last 72 hours. Lipid Profile No results for input(s): CHOL, HDL, LDLCALC, TRIG, CHOLHDL, LDLDIRECT in the last 72 hours. Thyroid function studies No results for input(s): TSH, T4TOTAL, T3FREE, THYROIDAB in the last 72 hours.  Invalid input(s): FREET3 Anemia work up No results for input(s): VITAMINB12, FOLATE, FERRITIN, TIBC,  IRON, RETICCTPCT in the last 72 hours. Urinalysis    Component Value Date/Time   COLORURINE YELLOW 07/29/2021 2014   APPEARANCEUR CLEAR 07/29/2021 2014   LABSPEC 1.010 07/29/2021 2014   PHURINE 6.5 07/29/2021 2014   GLUCOSEU 100 (A) 07/29/2021 2014   HGBUR SMALL (A) 07/29/2021 2014   BILIRUBINUR SMALL (A) 07/29/2021 2014   [place] NEGATIVE 07/29/2021 2014   PROTEINUR 30 (A) 07/29/2021 2014   NITRITE POSITIVE (A) 07/29/2021 2014   LEUKOCYTESUR LARGE (A) 07/29/2021 2014   Sepsis Labs Invalid input(s): PROCALCITONIN,  WBC,  LACTICIDVEN Microbiology Recent Results (from the past 240 hour(s))  Culture, blood (Routine x 2)     Status: None   Collection Time: 07/29/21  7:55 PM   Specimen: BLOOD  Result Value Ref Range Status   Specimen Description   Final    BLOOD BLOOD LEFT FOREARM Performed at [facility], [place] [address], [place], [place] [address]  Special Requests   Final    BOTTLES DRAWN AEROBIC AND ANAEROBIC Blood Culture results may not be optimal due to an excessive volume of blood received in culture bottles Performed at [place] [address] [place], [place] [address]    Culture   Final    NO GROWTH 5 DAYS Performed at [facility], [place] [address] [place], [place] [address]    Report Status 08/03/2021 FINAL  Final  Culture, blood (Routine x 2)     Status: None   Collection Time: 07/29/21  8:00 PM   Specimen: BLOOD  Result Value Ref Range Status   Specimen Description   Final    BLOOD LEFT ANTECUBITAL Performed at [place] [address] [place], [place] [address]    Special Requests   Final    BOTTLES DRAWN AEROBIC AND ANAEROBIC Blood Culture adequate volume Performed at [place] [address] [place], [place] [address]    Culture   Final    NO GROWTH 5 DAYS Performed at [facility], [place] [address] [place], [place] [address]    Report Status  08/03/2021 FINAL  Final  Urine Culture     Status: Abnormal   Collection Time: 07/29/21  8:14 PM   Specimen: Urine, Clean Catch  Result Value Ref Range Status   Specimen Description   Final    URINE, CLEAN CATCH Performed at [facility], [place] [address], [place], [place] [address]    Special Requests   Final    NONE Performed at [facility], [place] [address], [place], [place] [address]    Culture MULTIPLE SPECIES PRESENT, SUGGEST RECOLLECTION (A)  Final   Report Status 07/31/2021 FINAL  Final  Resp Panel by RT-PCR (Flu A&B, Covid) Urine, Clean Catch     Status: None   Collection Time: 07/29/21  9:15 PM   Specimen: Urine, Clean Catch; Nasopharyngeal(NP) swabs in vial transport medium  Result Value Ref Range Status   SARS Coronavirus 2 by RT PCR NEGATIVE NEGATIVE Final    Comment: (NOTE) SARS-CoV-2 target nucleic acids are NOT DETECTED.  The SARS-CoV-2 RNA is generally detectable in upper respiratory specimens during the acute phase of infection. The lowest concentration of SARS-CoV-2 viral copies this assay can detect is 138 copies/mL. A negative result does not preclude SARS-Cov-2 infection and should not be used as the sole basis for treatment or other patient management decisions. A negative result may occur with  improper specimen collection/handling, submission of specimen other than nasopharyngeal swab, presence of viral mutation(s) within the areas targeted by this assay, and inadequate number of viral copies(<138 copies/mL). A negative result must be combined with clinical observations, patient history, and epidemiological information. The expected result is Negative.  Fact Sheet for Patients:  EntrepreneurPulse.com.au  Fact Sheet for Healthcare Providers:  IncredibleEmployment.be  This test is no t yet approved or cleared by the [place] FDA and  has been authorized for detection and/or  diagnosis of SARS-CoV-2 by FDA under an Emergency Use Authorization (EUA). This EUA will remain  in effect (meaning this test can be used) for the duration of the COVID-19 declaration under Section 564(b)(1) of the Act, 21 U.S.C.section 360bbb-3(b)(1), unless the authorization is terminated  or revoked sooner.       Influenza A by PCR NEGATIVE NEGATIVE Final   Influenza B by PCR NEGATIVE NEGATIVE Final    Comment: (NOTE) The Xpert Xpress SARS-CoV-2/FLU/RSV  plus assay is intended as an aid in the diagnosis of influenza from Nasopharyngeal swab specimens and should not be used as a sole basis for treatment. Nasal washings and aspirates are unacceptable for Xpert Xpress SARS-CoV-2/FLU/RSV testing.  Fact Sheet for Patients: EntrepreneurPulse.com.au  Fact Sheet for Healthcare Providers: IncredibleEmployment.be  This test is not yet approved or cleared by the [place] FDA and has been authorized for detection and/or diagnosis of SARS-CoV-2 by FDA under an Emergency Use Authorization (EUA). This EUA will remain in effect (meaning this test can be used) for the duration of the COVID-19 declaration under Section 564(b)(1) of the Act, 21 U.S.C. section 360bbb-3(b)(1), unless the authorization is terminated or revoked.  Performed at [facility], [place] [address] [place], [place] [address]      Time coordinating discharge: Over 30 minutes  SIGNED:   Shawna Clamp, MD  [facility] 08/06/2021, 1:43 PM Pager   If 7PM-7AM, please contact night-coverage

## 2021-08-06 NOTE — Discharge Instructions (Signed)
Advised to follow up PCP in one week. Advised to follow up Dr. Claudia Desanctis in one week. Advised to continue foley catheter.

## 2021-08-06 NOTE — TOC Progression Note (Addendum)
Transition of Care [facility]) - Progression Note    Patient Details  Name: Craig Snow MRN: [id number] Date of Birth: 03-08-1921  Transition of Care [facility]) CM/SW Contact  Leeroy Cha, RN Phone Number: 08/06/2021, 8:37 AM  Clinical Narrative:    Tcf-to son at [phone].  Has chosen [facility] to go to when ready.  May be [id number] after afternoon dose of iv abx.  Will follow md for expected dc date. Text to [facility] with patient information and hopeful dc date.  Expected Discharge Plan: [place] Barriers to Discharge: Continued Medical Work up  Expected Discharge Plan and Services Expected Discharge Plan: [place] In-house Referral: Clinical Social Work   Post Acute Care Choice: [place] Living arrangements for the past 2 months: [place]                                       Social Determinants of Health (SDOH) Interventions    Readmission Risk Interventions No flowsheet data found.

## 2021-08-06 NOTE — Progress Notes (Signed)
[place] for warfarin Indication: atrial fibrillation  Allergies  Allergen Reactions   Aspirin Other (See Comments)    Unknown reaction - listed on [facility] 07/30/21   Clindamycin/Lincomycin Other (See Comments)    Unknown reaction - listed on [facility] 07/30/21   Other Other (See Comments)    Unknown reaction to opioids - listed on [facility] 07/30/21   Sulfa Antibiotics Other (See Comments)    Unknown reaction - listed on [facility] 07/30/21    Patient Measurements: Height: 5\' 11"  (180.3 cm) Weight: 65.8 kg (145 lb) IBW/kg (Calculated) : 75.3  Vital Signs: Temp: 98.1 F (36.7 C) (02/03 0533) Temp Source: Oral (02/03 0533) BP: 105/57 (02/03 0947) Pulse Rate: 72 (02/03 0947)  Labs: Recent Labs    08/04/21 0339 08/05/21 0340 08/06/21 0332  HGB  --   --  10.5*  HCT  --   --  31.2*  PLT  --   --  186  LABPROT 30.0* 27.9* 29.0*  INR 2.9* 2.6* 2.7*     Estimated Creatinine Clearance: 41.7 mL/min (by C-G formula based on SCr of 0.92 mg/dL).  Medications: Pt prescribed warfarin PTA for atrial fibrillation -Per med rec, pt was self-administering medications prior to admission at ALF. Unable to confirm time/date of last warfarin dose PTA. INR was supratherapeutic on admission -Most recent reported regimen from ALF: 2 mg Sun, Tues; 3 mg all other days. Unable to confirm compliance.   Significant Events: -INR 5.1 on admission; s/p vitamin K 2.5 mg PO on 1/27  Assessment: Pt is a 88 year old male presenting from ALF with UTI. Pt prescribed warfarin prior to admission for atrial fibrillation. No history of CVA noted. Pharmacy consulted to dose/monitor warfarin. Note plan to discharge to SNF.  Today, 08/06/21 INR remains therapeutic  CBC (1/31): Hgb low but stable; Plt WNL Diet: Dysphagia 1; 50-100% meal intake charted No major DDI, though antibiotics may enhance effects of warfarin.  Goal of Therapy:  INR 2-3 Monitor platelets by anticoagulation protocol:  Yes   Plan:  Warfarin 2 mg PO today INR daily CBC at least q72 hr while on warfarin inpatient Monitor for signs of bleeding For discharge, at this point I would recommend reducing warfarin to 2 mg PO daily with INR check within 72 hours  Reagyn Facemire A, PharmD 08/06/2021,10:50 AM

## 2021-08-06 NOTE — Progress Notes (Signed)
Physical Therapy Treatment Patient Details Name: Craig Snow MRN: [id number] DOB: August 10, 1920 Today's Date: 08/06/2021   History of Present Illness Pt is a 88 y.o.  male sent from ALF to [facility] ED due to increase gential pain x2wks,UTI due to chronic indwelling urinary catheter- indwelling Foley last changed on 1/16, CXR indicated possible pneumonia. PMH significant for bladder cancer and chronic urinary retention, a-fib, HTN, and HLD,    PT Comments    Patient making good progress with mobility and appears less fearful of ambulating and falling today. Pt required min assist for transfers and 2 short bouts of gait (30', 30') with RW. He has a tendency to look down while ambulating and assist needed to prevent pt from walking into walls and hallway obstacles. VSS throughout mobility today. He will continue to benefit from skilled PT interventions at SNF setting to progress mobility and independence as able.    Recommendations for follow up therapy are one component of a multi-disciplinary discharge planning process, led by the attending physician.  Recommendations may be updated based on patient status, additional functional criteria and insurance authorization.  Follow Up Recommendations  Skilled nursing-short term rehab (<3 hours/day)     Assistance Recommended at Discharge Frequent or constant Supervision/Assistance  Patient can return home with the following A little help with walking and/or transfers;A little help with bathing/dressing/bathroom;Assist for transportation;Help with stairs or ramp for entrance;Assistance with cooking/housework   Equipment Recommendations  None recommended by PT    Recommendations for Other Services       Precautions / Restrictions Precautions Precautions: Fall Precaution Comments: HOH-communicates with white board; severe bladder spams intermittently Restrictions Weight Bearing Restrictions: No     Mobility  Bed Mobility Overal bed mobility: Needs  Assistance Bed Mobility: Supine to Sit     Supine to sit: Min guard, HOB elevated     General bed mobility comments: Increased time. Min guard for safety.    Transfers Overall transfer level: Needs assistance Equipment used: Rolling walker (2 wheels) Transfers: Sit to/from Stand Sit to Stand: Min assist           General transfer comment: cues for hand placement, pt using bil UE to power up from EOB and recliner. assist to steady balance with hand transition to RW.    Ambulation/Gait Ambulation/Gait assistance: Min assist, +2 safety/equipment Gait Distance (Feet): 75 Feet (30, 45) Assistive device: Rolling walker (2 wheels) Gait Pattern/deviations: Step-through pattern, Decreased stride length, Trunk flexed Gait velocity: decr     General Gait Details: assist to steady and manage walker. pt with poor posture and cues needed for navigating/directing in hallway. pt's knee flexing as he fatigued with gait and seated rest provided at pt request. VSS throughout with HR ~100-115 bpm.   Stairs             Wheelchair Mobility    Modified Rankin (Stroke Patients Only)       Balance Overall balance assessment: Needs assistance, History of Falls Sitting-balance support: Feet supported Sitting balance-Leahy Scale: Good     Standing balance support: Bilateral upper extremity supported, During functional activity, Reliant on assistive device for balance Standing balance-Leahy Scale: Poor                              Cognition Arousal/Alertness: Awake/alert Behavior During Therapy: WFL for tasks assessed/performed Overall Cognitive Status: Within Functional Limits for tasks assessed  General Comments: pleasant and agreeable to mobilize, limited by [employer]        Exercises      General Comments        Pertinent Vitals/Pain Pain Assessment Pain Assessment: No/denies pain    Home Living                           Prior Function            PT Goals (current goals can now be found in the care plan section) Acute Rehab PT Goals Patient Stated Goal: Get back to feeling better and walking PT Goal Formulation: With patient Time For Goal Achievement: 08/14/21 Potential to Achieve Goals: Good Progress towards PT goals: Progressing toward goals    Frequency    Min 3X/week      PT Plan Current plan remains appropriate    Co-evaluation              AM-PAC PT "6 Clicks" Mobility   Outcome Measure  Help needed turning from your back to your side while in a flat bed without using bedrails?: A Little Help needed moving from lying on your back to sitting on the side of a flat bed without using bedrails?: A Little Help needed moving to and from a bed to a chair (including a wheelchair)?: A Little Help needed standing up from a chair using your arms (e.g., wheelchair or bedside chair)?: A Little Help needed to walk in hospital room?: A Little Help needed climbing 3-5 steps with a railing? : A Lot 6 Click Score: 17    End of Session Equipment Utilized During Treatment: Gait belt Activity Tolerance: Patient tolerated treatment well Patient left: in chair;with call bell/phone within reach;with chair alarm set;with family/visitor present Nurse Communication: Mobility status PT Visit Diagnosis: Unsteadiness on feet (R26.81);Muscle weakness (generalized) (M62.81);Difficulty in walking, not elsewhere classified (R26.2);Pain     Time: 2778-2423 PT Time Calculation (min) (ACUTE ONLY): 24 min  Charges:  $Gait Training: 8-22 mins $Therapeutic Activity: 8-22 mins                     Verner Mould, DPT Acute Rehabilitation Services Office [phone] Pager [phone]    Jacques Navy 08/06/2021, 12:26 PM

## 2021-08-06 NOTE — Plan of Care (Signed)
Patient alert, very hard of hearing, follows command, tolerated tap water enema with large amount of BM, patient comfortably resting in bed and no c/o of pain. Will continue plan of care.  Problem: Clinical Measurements: Goal: Ability to maintain clinical measurements within normal limits will improve Outcome: Progressing   Problem: Activity: Goal: Risk for activity intolerance will decrease Outcome: Progressing   Problem: Nutrition: Goal: Adequate nutrition will be maintained Outcome: Progressing   Problem: Pain Managment: Goal: General experience of comfort will improve Outcome: Progressing   Problem: Elimination: Goal: Will not experience complications related to bowel motility Outcome: Progressing

## 2021-10-22 ENCOUNTER — Emergency Department (HOSPITAL_COMMUNITY): Payer: Medicare Other

## 2021-10-22 ENCOUNTER — Encounter (HOSPITAL_COMMUNITY): Payer: Self-pay | Admitting: Internal Medicine

## 2021-10-22 ENCOUNTER — Inpatient Hospital Stay (HOSPITAL_COMMUNITY)
Admission: EM | Admit: 2021-10-22 | Discharge: 2021-10-27 | DRG: 522 | Disposition: A | Discharge status: Skilled Nursing Facility | Payer: Medicare Other | Attending: Internal Medicine | Admitting: Internal Medicine

## 2021-10-22 DIAGNOSIS — Z882 Allergy status to sulfonamides status: Secondary | ICD-10-CM | POA: Diagnosis not present

## 2021-10-22 DIAGNOSIS — R451 Restlessness and agitation: Secondary | ICD-10-CM | POA: Diagnosis not present

## 2021-10-22 DIAGNOSIS — D638 Anemia in other chronic diseases classified elsewhere: Secondary | ICD-10-CM | POA: Diagnosis not present

## 2021-10-22 DIAGNOSIS — S72012A Unspecified intracapsular fracture of left femur, initial encounter for closed fracture: Secondary | ICD-10-CM | POA: Diagnosis present

## 2021-10-22 DIAGNOSIS — I10 Essential (primary) hypertension: Secondary | ICD-10-CM | POA: Diagnosis present

## 2021-10-22 DIAGNOSIS — Y92009 Unspecified place in unspecified non-institutional (private) residence as the place of occurrence of the external cause: Secondary | ICD-10-CM | POA: Diagnosis not present

## 2021-10-22 DIAGNOSIS — I480 Paroxysmal atrial fibrillation: Secondary | ICD-10-CM | POA: Diagnosis present

## 2021-10-22 DIAGNOSIS — S42302A Unspecified fracture of shaft of humerus, left arm, initial encounter for closed fracture: Secondary | ICD-10-CM | POA: Diagnosis present

## 2021-10-22 DIAGNOSIS — S72009A Fracture of unspecified part of neck of unspecified femur, initial encounter for closed fracture: Secondary | ICD-10-CM | POA: Diagnosis present

## 2021-10-22 DIAGNOSIS — R296 Repeated falls: Secondary | ICD-10-CM | POA: Diagnosis present

## 2021-10-22 DIAGNOSIS — D72829 Elevated white blood cell count, unspecified: Secondary | ICD-10-CM | POA: Diagnosis present

## 2021-10-22 DIAGNOSIS — Y9301 Activity, walking, marching and hiking: Secondary | ICD-10-CM | POA: Diagnosis present

## 2021-10-22 DIAGNOSIS — I4891 Unspecified atrial fibrillation: Secondary | ICD-10-CM | POA: Diagnosis not present

## 2021-10-22 DIAGNOSIS — Z66 Do not resuscitate: Secondary | ICD-10-CM | POA: Diagnosis present

## 2021-10-22 DIAGNOSIS — E559 Vitamin D deficiency, unspecified: Secondary | ICD-10-CM | POA: Diagnosis present

## 2021-10-22 DIAGNOSIS — Z87891 Personal history of nicotine dependence: Secondary | ICD-10-CM | POA: Diagnosis not present

## 2021-10-22 DIAGNOSIS — D539 Nutritional anemia, unspecified: Secondary | ICD-10-CM | POA: Diagnosis present

## 2021-10-22 DIAGNOSIS — Z79899 Other long term (current) drug therapy: Secondary | ICD-10-CM

## 2021-10-22 DIAGNOSIS — Z7901 Long term (current) use of anticoagulants: Secondary | ICD-10-CM

## 2021-10-22 DIAGNOSIS — Z886 Allergy status to analgesic agent status: Secondary | ICD-10-CM | POA: Diagnosis not present

## 2021-10-22 DIAGNOSIS — E039 Hypothyroidism, unspecified: Secondary | ICD-10-CM | POA: Diagnosis present

## 2021-10-22 DIAGNOSIS — R410 Disorientation, unspecified: Secondary | ICD-10-CM | POA: Diagnosis not present

## 2021-10-22 DIAGNOSIS — E785 Hyperlipidemia, unspecified: Secondary | ICD-10-CM | POA: Diagnosis present

## 2021-10-22 DIAGNOSIS — Z888 Allergy status to other drugs, medicaments and biological substances status: Secondary | ICD-10-CM | POA: Diagnosis not present

## 2021-10-22 DIAGNOSIS — Z8551 Personal history of malignant neoplasm of bladder: Secondary | ICD-10-CM | POA: Diagnosis not present

## 2021-10-22 DIAGNOSIS — Z881 Allergy status to other antibiotic agents status: Secondary | ICD-10-CM

## 2021-10-22 DIAGNOSIS — Z79890 Hormone replacement therapy (postmenopausal): Secondary | ICD-10-CM

## 2021-10-22 DIAGNOSIS — E538 Deficiency of other specified B group vitamins: Secondary | ICD-10-CM | POA: Diagnosis present

## 2021-10-22 DIAGNOSIS — Z96641 Presence of right artificial hip joint: Secondary | ICD-10-CM | POA: Diagnosis present

## 2021-10-22 DIAGNOSIS — D62 Acute posthemorrhagic anemia: Secondary | ICD-10-CM | POA: Diagnosis present

## 2021-10-22 DIAGNOSIS — Z23 Encounter for immunization: Secondary | ICD-10-CM

## 2021-10-22 DIAGNOSIS — S72002A Fracture of unspecified part of neck of left femur, initial encounter for closed fracture: Secondary | ICD-10-CM | POA: Diagnosis not present

## 2021-10-22 DIAGNOSIS — W1830XA Fall on same level, unspecified, initial encounter: Secondary | ICD-10-CM | POA: Diagnosis present

## 2021-10-22 DIAGNOSIS — S51012A Laceration without foreign body of left elbow, initial encounter: Secondary | ICD-10-CM

## 2021-10-22 LAB — COMPREHENSIVE METABOLIC PANEL
ALT: 11 U/L (ref 0–44)
AST: 30 U/L (ref 15–41)
Albumin: 3.2 g/dL — ABNORMAL LOW (ref 3.5–5.0)
Alkaline Phosphatase: 42 U/L (ref 38–126)
Anion gap: 8 (ref 5–15)
BUN: 17 mg/dL (ref 8–23)
CO2: 25 mmol/L (ref 22–32)
Calcium: 9.3 mg/dL (ref 8.9–10.3)
Chloride: 103 mmol/L (ref 98–111)
Creatinine, Ser: 1.01 mg/dL (ref 0.61–1.24)
GFR, Estimated: >60 mL/min (ref >60)
Glucose, Bld: 197 mg/dL — ABNORMAL HIGH (ref 70–99)
Potassium: 4.8 mmol/L (ref 3.5–5.1)
Sodium: 136 mmol/L (ref 135–145)
Total Bilirubin: 0.9 mg/dL (ref 0.3–1.2)
Total Protein: 5.9 g/dL — ABNORMAL LOW (ref 6.5–8.1)

## 2021-10-22 LAB — CBC WITH DIFFERENTIAL/PLATELET
Abs Immature Granulocytes: 0.07 10*3/uL (ref 0.00–0.07)
Basophils Absolute: 0 10*3/uL (ref 0.0–0.1)
Basophils Relative: 0 %
Eosinophils Absolute: 0 10*3/uL (ref 0.0–0.5)
Eosinophils Relative: 0 %
HCT: 32.2 % — ABNORMAL LOW (ref 39.0–52.0)
Hemoglobin: 10.6 g/dL — ABNORMAL LOW (ref 13.0–17.0)
Immature Granulocytes: 1 %
Lymphocytes Relative: 8 %
Lymphs Abs: 0.9 10*3/uL (ref 0.7–4.0)
MCH: 37.3 pg — ABNORMAL HIGH (ref 26.0–34.0)
MCHC: 32.9 g/dL (ref 30.0–36.0)
MCV: 113.4 fL — ABNORMAL HIGH (ref 80.0–100.0)
Monocytes Absolute: 1 10*3/uL (ref 0.1–1.0)
Monocytes Relative: 9 %
Neutro Abs: 9.1 10*3/uL — ABNORMAL HIGH (ref 1.7–7.7)
Neutrophils Relative %: 82 %
Platelets: 163 10*3/uL (ref 150–400)
RBC: 2.84 MIL/uL — ABNORMAL LOW (ref 4.22–5.81)
RDW: 13.5 % (ref 11.5–15.5)
WBC: 11.2 10*3/uL — ABNORMAL HIGH (ref 4.0–10.5)
nRBC: 0 % (ref 0.0–0.2)

## 2021-10-22 LAB — PROTIME-INR
INR: 1.8 — ABNORMAL HIGH (ref 0.8–1.2)
Prothrombin Time: 20.4 seconds — ABNORMAL HIGH (ref 11.4–15.2)

## 2021-10-22 MED ORDER — TETANUS-DIPHTH-ACELL PERTUSSIS 5-2.5-18.5 LF-MCG/0.5 IM SUSY
0.5000 mL | PREFILLED_SYRINGE | Freq: Once | INTRAMUSCULAR | Status: AC
Start: 2021-10-22 — End: 2021-10-22
  Administered 2021-10-22: 0.5 mL via INTRAMUSCULAR
  Filled 2021-10-22: qty 0.5

## 2021-10-22 MED ORDER — HYDROMORPHONE HCL 1 MG/ML IJ SOLN: 0.5000 mg | INTRAMUSCULAR | Status: DC | PRN

## 2021-10-22 MED ORDER — FENTANYL CITRATE PF 50 MCG/ML IJ SOSY
50.0000 ug | PREFILLED_SYRINGE | Freq: Once | INTRAMUSCULAR | Status: AC
  Administered 2021-10-22: 50 ug via INTRAVENOUS
  Filled 2021-10-22: qty 1

## 2021-10-22 MED ORDER — FUROSEMIDE 20 MG PO TABS
20.0000 mg | ORAL_TABLET | Freq: Every morning | ORAL | Status: DC
  Administered 2021-10-23 – 2021-10-24 (×2): 20 mg via ORAL
  Filled 2021-10-22 (×3): qty 1

## 2021-10-22 MED ORDER — LIOTHYRONINE SODIUM 5 MCG PO TABS
5.0000 ug | ORAL_TABLET | Freq: Every morning | ORAL | Status: DC
  Administered 2021-10-23 – 2021-10-27 (×5): 5 ug via ORAL
  Filled 2021-10-22 (×6): qty 1

## 2021-10-22 MED ORDER — POLYETHYLENE GLYCOL 3350 17 G PO PACK: 17.0000 g | PACK | Freq: Every day | ORAL | Status: DC | PRN

## 2021-10-22 MED ORDER — ATORVASTATIN CALCIUM 10 MG PO TABS
10.0000 mg | ORAL_TABLET | Freq: Every day | ORAL | Status: DC
  Filled 2021-10-22: qty 1

## 2021-10-22 MED ORDER — TAMSULOSIN HCL 0.4 MG PO CAPS
0.4000 mg | ORAL_CAPSULE | Freq: Every morning | ORAL | Status: DC
  Administered 2021-10-23 – 2021-10-27 (×5): 0.4 mg via ORAL
  Filled 2021-10-22 (×5): qty 1

## 2021-10-22 MED ORDER — LIDOCAINE-EPINEPHRINE (PF) 2 %-1:200000 IJ SOLN
10.0000 mL | Freq: Once | INTRAMUSCULAR | Status: AC
  Administered 2021-10-22: 10 mL
  Filled 2021-10-22: qty 20

## 2021-10-22 MED ORDER — MELATONIN 5 MG PO TABS
10.0000 mg | ORAL_TABLET | Freq: Every evening | ORAL | Status: DC | PRN
  Administered 2021-10-24 – 2021-10-26 (×3): 10 mg via ORAL
  Filled 2021-10-22 (×3): qty 2

## 2021-10-22 MED ORDER — HYDROCODONE-ACETAMINOPHEN 5-325 MG PO TABS: 1.0000 | ORAL_TABLET | Freq: Four times a day (QID) | ORAL | Status: DC | PRN

## 2021-10-22 MED ORDER — MIRABEGRON ER 25 MG PO TB24
25.0000 mg | ORAL_TABLET | Freq: Every day | ORAL | Status: DC
Start: 2021-10-23 — End: 2021-10-27
  Administered 2021-10-23 – 2021-10-27 (×5): 25 mg via ORAL
  Filled 2021-10-22 (×5): qty 1

## 2021-10-22 MED ORDER — LEVOTHYROXINE SODIUM 50 MCG PO TABS
50.0000 ug | ORAL_TABLET | Freq: Every morning | ORAL | Status: DC
  Administered 2021-10-25 – 2021-10-27 (×3): 50 ug via ORAL
  Filled 2021-10-22 (×3): qty 1

## 2021-10-22 MED ORDER — MORPHINE SULFATE (PF) 2 MG/ML IV SOLN
2.0000 mg | Freq: Once | INTRAVENOUS | Status: AC
Start: 2021-10-22 — End: 2021-10-22
  Administered 2021-10-22: 2 mg via INTRAVENOUS
  Filled 2021-10-22: qty 1

## 2021-10-22 MED ORDER — METOPROLOL TARTRATE 25 MG PO TABS
25.0000 mg | ORAL_TABLET | Freq: Two times a day (BID) | ORAL | Status: DC
  Administered 2021-10-23 – 2021-10-27 (×7): 25 mg via ORAL
  Filled 2021-10-22 (×11): qty 1

## 2021-10-22 MED ORDER — PANTOPRAZOLE SODIUM 40 MG PO TBEC
40.0000 mg | DELAYED_RELEASE_TABLET | Freq: Every morning | ORAL | Status: DC
  Administered 2021-10-23 – 2021-10-27 (×5): 40 mg via ORAL
  Filled 2021-10-22 (×5): qty 1

## 2021-10-22 NOTE — ED Triage Notes (Signed)
Pt bib [facility] from home after wheelchair tipped over in the backyard. No LOC, pt reported hitting head on pavement. Pt also c/o left hip pain. Pt on coumadin, hx afib. 20G L FA.  ?

## 2021-10-22 NOTE — ED Notes (Signed)
Family at bedside, updated on POC. ?

## 2021-10-22 NOTE — ED Provider Notes (Signed)
?[place] ?Provider Note ? ? ?CSN: [id number] ?Arrival date & time: 10/22/21  2117 ? ?  ? ?History ? ?Chief Complaint  ?Patient presents with  ? Fall  ? ? ?Craig Snow is a 88 y.o. male. ? ?HPI ?88 year old male presents with fall from a wheelchair.  He has left hip pain but he also hit his head.  He is on warfarin.  He has a mild headache but is mostly having left hip pain.  He also scraped his left elbow.  No chest pain or shortness of breath.  Brought in as a level 2 trauma due to the head injury on warfarin. ? ?Home Medications ?Prior to Admission medications   ?Medication Sig Start Date End Date Taking? Authorizing Provider  ?acetaminophen (TYLENOL) 500 MG tablet Take 1,000 mg by mouth every 6 (six) hours as needed (pain).    [provider]  ?atorvastatin (LIPITOR) 10 MG tablet Take 10 mg by mouth at bedtime.    [provider]  ?Docusate Sodium (COLACE PO) Take 2 capsules by mouth daily as needed (constipation).    [provider]  ?furosemide (LASIX) 20 MG tablet Take 20 mg by mouth every morning.    [provider]  ?levothyroxine (SYNTHROID) 50 MCG tablet Take 50 mcg by mouth every morning.    [provider]  ?liothyronine (CYTOMEL) 5 MCG tablet Take 5 mcg by mouth every morning.    [provider]  ?Melatonin 10 MG TABS Take 10 mg by mouth at bedtime as needed (sleep).    [provider]  ?metoprolol tartrate (LOPRESSOR) 25 MG tablet Take 1 tablet (25 mg total) by mouth 2 (two) times daily. 08/06/21   Shawna Clamp, MD  ?mirabegron ER (MYRBETRIQ) 25 MG TB24 tablet Take 1 tablet (25 mg total) by mouth daily. 08/07/21   Shawna Clamp, MD  ?mirtazapine (REMERON) 15 MG tablet Take 15 mg by mouth at bedtime.    [provider]  ?pantoprazole (PROTONIX) 40 MG tablet Take 40 mg by mouth every morning.    [provider]  ?phenazopyridine (PYRIDIUM) 200 MG tablet Take 200 mg by mouth every 8  (eight) hours as needed for pain. 07/22/21   [provider]  ?polyethylene glycol (MIRALAX / GLYCOLAX) 17 g packet Take 17 g by mouth every morning.    [provider]  ?potassium chloride (KLOR-CON M) 10 MEQ tablet Take 10 mEq by mouth every morning.    [provider]  ?tamsulosin (FLOMAX) 0.4 MG CAPS capsule Take 0.4 mg by mouth every morning.    [provider]  ?traMADol (ULTRAM) 50 MG tablet Take 50 mg by mouth every 8 (eight) hours as needed for moderate pain or severe pain. 07/22/21   [provider]  ?warfarin (COUMADIN) 2 MG tablet Take 2 mg by mouth See admin instructions. Take one tablet (2 mg) by mouth in the morning on Sunday and Tuesday morning (take 3 mg on Monday, Wednesday, Thursday, Friday)    [provider]  ?warfarin (COUMADIN) 3 MG tablet Take 3 mg by mouth See admin instructions. Take one tablet (3 mg) by mouth on Monday, Wednesday, Thursday, Friday morning (take 2 mg on Sunday and Tuesday)    [provider]  ?   ? ?Allergies    ?Aspirin, Clindamycin/lincomycin, Other, and Sulfa antibiotics   ? ?Review of Systems   ?Review of Systems  ?Respiratory:  Negative for shortness of breath.   ?Cardiovascular:  Negative for  chest pain.  ?Gastrointestinal:  Negative for abdominal pain.  ?Musculoskeletal:  Positive for arthralgias.  ?Neurological:  Positive for headaches.  ? ?Physical Exam ?Updated Vital Signs ?BP 114/65   Pulse 76   Temp 97.8 ?F (36.6 ?C) (Oral)   Resp 19   SpO2 95%  ?Physical Exam ?Vitals and nursing note reviewed.  ?Constitutional:   ?   Appearance: He is well-developed.  ?HENT:  ?   Head: Normocephalic and atraumatic.  ?   Comments: No significant head trauma noted ?Cardiovascular:  ?   Rate and Rhythm: Normal rate and regular rhythm.  ?   Pulses:     ?     Radial pulses are 2+ on the left side.  ?   Heart sounds: Normal heart sounds.  ?Pulmonary:  ?   Effort: Pulmonary effort is normal.  ?   Breath sounds: Normal  breath sounds.  ?Abdominal:  ?   Palpations: Abdomen is soft.  ?   Tenderness: There is no abdominal tenderness.  ?Musculoskeletal:  ?   Left elbow: Laceration (avulsion over elbow) present. Tenderness (mild) present.  ?   Left hip: Deformity and tenderness present. Decreased range of motion.  ?Skin: ?   General: Skin is warm and dry.  ?Neurological:  ?   Mental Status: He is alert.  ? ? ?ED Results / Procedures / Treatments   ?Labs ?(all labs ordered are listed, but only abnormal results are displayed) ?Labs Reviewed  ?COMPREHENSIVE METABOLIC PANEL - Abnormal; Notable for the following components:  ?    Result Value  ? Glucose, Bld 197 (*)   ? Total Protein 5.9 (*)   ? Albumin 3.2 (*)   ? All other components within normal limits  ?PROTIME-INR - Abnormal; Notable for the following components:  ? Prothrombin Time 20.4 (*)   ? INR 1.8 (*)   ? All other components within normal limits  ?CBC WITH DIFFERENTIAL/PLATELET - Abnormal; Notable for the following components:  ? WBC 11.2 (*)   ? RBC 2.84 (*)   ? Hemoglobin 10.6 (*)   ? HCT 32.2 (*)   ? MCV 113.4 (*)   ? MCH 37.3 (*)   ? Neutro Abs 9.1 (*)   ? All other components within normal limits  ?BASIC METABOLIC PANEL  ?CBC  ?TYPE AND SCREEN  ? ? ?EKG ?None ? ?Radiology ?DG Elbow Complete Left ? ?Result Date: 10/22/2021 ?CLINICAL DATA:  Fall EXAM: LEFT ELBOW - COMPLETE 3+ VIEW COMPARISON:  None. FINDINGS: No acute bony abnormality. Specifically, no fracture, subluxation, or dislocation. No joint effusion. Soft tissues are intact. IMPRESSION: Negative. Electronically Signed   By: Rolm Baptise M.D.   On: 10/22/2021 22:22  ? ?CT Head Wo Contrast ? ?Result Date: 10/22/2021 ?CLINICAL DATA:  Fall.  Head trauma, minor (Age >= 65y) EXAM: CT HEAD WITHOUT CONTRAST TECHNIQUE: Contiguous axial images were obtained from the base of the skull through the vertex without intravenous contrast. RADIATION DOSE REDUCTION: This exam was performed according to the departmental dose-optimization  program which includes automated exposure control, adjustment of the mA and/or kV according to patient size and/or use of iterative reconstruction technique. COMPARISON:  None. FINDINGS: Brain: There is atrophy and chronic small vessel disease changes. No acute intracranial abnormality. Specifically, no hemorrhage, hydrocephalus, mass lesion, acute infarction, or significant intracranial injury. Vascular: No hyperdense vessel or unexpected calcification. Skull: No acute calvarial abnormality. Sinuses/Orbits: No acute findings. Postoperative changes in the left paranasal sinuses. Other: None IMPRESSION: Atrophy, chronic microvascular  disease. No acute intracranial abnormality. Electronically Signed   By: Rolm Baptise M.D.   On: 10/22/2021 22:09  ? ?CT Cervical Spine Wo Contrast ? ?Result Date: 10/22/2021 ?CLINICAL DATA:  Neck trauma (Age >= 65y).  Fall. EXAM: CT CERVICAL SPINE WITHOUT CONTRAST TECHNIQUE: Multidetector CT imaging of the cervical spine was performed without intravenous contrast. Multiplanar CT image reconstructions were also generated. RADIATION DOSE REDUCTION: This exam was performed according to the departmental dose-optimization program which includes automated exposure control, adjustment of the mA and/or kV according to patient size and/or use of iterative reconstruction technique. COMPARISON:  None. FINDINGS: Alignment: Normal Skull base and vertebrae: No acute fracture. No primary bone lesion or focal pathologic process. Soft tissues and spinal canal: No prevertebral fluid or swelling. No visible canal hematoma. Disc levels:  Diffuse degenerative disc disease and facet disease. Upper chest: Biapical scarring. Other: No acute findings IMPRESSION: No acute bony abnormality. Electronically Signed   By: Rolm Baptise M.D.   On: 10/22/2021 22:11  ? ?DG Chest Portable 1 View ? ?Result Date: 10/22/2021 ?CLINICAL DATA:  Fall EXAM: PORTABLE CHEST 1 VIEW COMPARISON:  None. FINDINGS: Heart and mediastinal  contours are within normal limits. No focal opacities or effusions. No acute bony abnormality. Aortic atherosclerosis. IMPRESSION: No active cardiopulmonary disease. Electronically Signed   By: Rolm Baptise M.D.

## 2021-10-22 NOTE — H&P (Signed)
?History and Physical  ? ?Kemond Amorin XQJ:[id number] DOB: 1921/05/16 DOA: 10/22/2021 ? ?PCP: Pcp, No  ? ?Patient coming from: Home ? ?Chief Complaint: Fall ? ?HPI: Melbert Botelho is a 88 y.o. male with medical history significant of A-fib, bladder cancer, hypothyroidism, hyperlipidemia, hypertension presenting after fall at home. ? ?Patient recently discharged from rehab following separate admission.  He fell while walking with his walker which appears to have come out from under him.  He fell on his left side including his left elbow, hip and also reported hitting his head. ? ?He denies fevers, chills, chest pain, shortness of breath, abdominal pain, constipation, diarrhea, nausea, vomiting. ? ?ED Course: Vital signs in the ED stable.  Lab work-up included CMP with glucose 197, protein 5.9, albumin 3.2.  CBC with leukocytosis to 11.2 and hemoglobin stable at 10.6.  PT stably elevated at 20.4 and INR stable elevated at 1.8.  Imaging work-up included chest x-ray which showed no acute abnormality, CT head which showed no acute abnormality, CT C-spine which showed no acute abnormality, elbow x-ray of the left which showed no acute abnormality.  Left hip x-ray however did show a left femur neck fracture with varus angulation.  Orthopedics was consulted and will see the patient recommend n.p.o.  Patient received fentanyl and morphine in the ED. ? ?Review of Systems: As per HPI otherwise all other systems reviewed and are negative. ? ?Past Medical History:  ?Diagnosis Date  ? Acute lower UTI 07/30/2021  ? Atrial fibrillation ([place])   ? Candidiasis of penis 07/30/2021  ? Community acquired pneumonia 07/30/2021  ? Hyperlipidemia   ? Hypertension   ? Supratherapeutic INR 07/30/2021  ? Thyrotoxicosis   ? ? ?No past surgical history on file. ? ?Social History ? reports that he quit smoking about 50 years ago. His smoking use included cigarettes. He has never used smokeless tobacco. He reports that he does not currently use alcohol. He  reports that he does not currently use drugs. ? ?Allergies  ?Allergen Reactions  ? Aspirin Other (See Comments)  ?  Unknown reaction - listed on [facility] 07/30/21  ? Clindamycin/Lincomycin Other (See Comments)  ?  Unknown reaction - listed on [facility] 07/30/21  ? Other Other (See Comments)  ?  Unknown reaction to opioids - listed on [facility] 07/30/21  ? Sulfa Antibiotics Other (See Comments)  ?  Unknown reaction - listed on [facility] 07/30/21  ? ? ?No family history on file. ? ? ?Prior to Admission medications   ?Medication Sig Start Date End Date Taking? Authorizing Provider  ?acetaminophen (TYLENOL) 500 MG tablet Take 1,000 mg by mouth every 6 (six) hours as needed (pain).    [provider]  ?atorvastatin (LIPITOR) 10 MG tablet Take 10 mg by mouth at bedtime.    [provider]  ?Docusate Sodium (COLACE PO) Take 2 capsules by mouth daily as needed (constipation).    [provider]  ?furosemide (LASIX) 20 MG tablet Take 20 mg by mouth every morning.    [provider]  ?levothyroxine (SYNTHROID) 50 MCG tablet Take 50 mcg by mouth every morning.    [provider]  ?liothyronine (CYTOMEL) 5 MCG tablet Take 5 mcg by mouth every morning.    [provider]  ?Melatonin 10 MG TABS Take 10 mg by mouth at bedtime as needed (sleep).    [provider]  ?metoprolol tartrate (LOPRESSOR) 25 MG tablet Take 1 tablet (25 mg total) by mouth 2 (two) times daily. 08/06/21  Shawna Clamp, MD  ?mirabegron ER (MYRBETRIQ) 25 MG TB24 tablet Take 1 tablet (25 mg total) by mouth daily. 08/07/21   Shawna Clamp, MD  ?mirtazapine (REMERON) 15 MG tablet Take 15 mg by mouth at bedtime.    [provider]  ?pantoprazole (PROTONIX) 40 MG tablet Take 40 mg by mouth every morning.    [provider]  ?phenazopyridine (PYRIDIUM) 200 MG tablet Take 200 mg by mouth every 8 (eight) hours as needed for pain. 07/22/21   [provider]  ?polyethylene glycol (MIRALAX / GLYCOLAX) 17 g  packet Take 17 g by mouth every morning.    [provider]  ?potassium chloride (KLOR-CON M) 10 MEQ tablet Take 10 mEq by mouth every morning.    [provider]  ?tamsulosin (FLOMAX) 0.4 MG CAPS capsule Take 0.4 mg by mouth every morning.    [provider]  ?traMADol (ULTRAM) 50 MG tablet Take 50 mg by mouth every 8 (eight) hours as needed for moderate pain or severe pain. 07/22/21   [provider]  ?warfarin (COUMADIN) 2 MG tablet Take 2 mg by mouth See admin instructions. Take one tablet (2 mg) by mouth in the morning on Sunday and Tuesday morning (take 3 mg on Monday, Wednesday, Thursday, Friday)    [provider]  ?warfarin (COUMADIN) 3 MG tablet Take 3 mg by mouth See admin instructions. Take one tablet (3 mg) by mouth on Monday, Wednesday, Thursday, Friday morning (take 2 mg on Sunday and Tuesday)    [provider]  ? ? ?Physical Exam: ?Vitals:  ? 10/22/21 2118 10/22/21 2145 10/22/21 2215  ?BP: 130/60 118/72 114/65  ?Pulse: 86 78 76  ?Resp: '17 14 19  '$ ?Temp: 97.8 ?F (36.6 ?C)    ?TempSrc: Oral    ?SpO2: 100% 97% 95%  ? ? ?Physical Exam ?Constitutional:   ?   General: He is not in acute distress. ?   Appearance: Normal appearance.  ?HENT:  ?   Head: Normocephalic and atraumatic.  ?   Mouth/Throat:  ?   Mouth: Mucous membranes are moist.  ?   Pharynx: Oropharynx is clear.  ?Eyes:  ?   Extraocular Movements: Extraocular movements intact.  ?   Pupils: Pupils are equal, round, and reactive to light.  ?Cardiovascular:  ?   Rate and Rhythm: Normal rate and regular rhythm.  ?   Pulses: Normal pulses.  ?   Heart sounds: Normal heart sounds.  ?Pulmonary:  ?   Effort: Pulmonary effort is normal. No respiratory distress.  ?   Breath sounds: Normal breath sounds.  ?Abdominal:  ?   General: Bowel sounds are normal. There is no distension.  ?   Palpations: Abdomen is soft.  ?   Tenderness: There is no abdominal tenderness.  ?Musculoskeletal:     ?   General: No  swelling or deformity.  ?   Comments: bilateral lower extremities neurovascularly intact  ?Skin: ?   General: Skin is warm and dry.  ?Neurological:  ?   General: No focal deficit present.  ?   Mental Status: Mental status is at baseline.  ? ? ?Labs on Admission: I have personally reviewed following labs and imaging studies ? ?CBC: ?Recent Labs  ?Lab 10/22/21 ?2147  ?WBC 11.2*  ?NEUTROABS 9.1*  ?HGB 10.6*  ?HCT 32.2*  ?MCV 113.4*  ?PLT 163  ? ? ?Basic Metabolic Panel: ?Recent Labs  ?Lab 10/22/21 ?2147  ?NA 136  ?K 4.8  ?CL 103  ?CO2  25  ?GLUCOSE 197*  ?BUN 17  ?CREATININE 1.01  ?CALCIUM 9.3  ? ? ?GFR: ?CrCl cannot be calculated (Unknown ideal weight.). ? ?Liver Function Tests: ?Recent Labs  ?Lab 10/22/21 ?2147  ?AST 30  ?ALT 11  ?ALKPHOS 42  ?BILITOT 0.9  ?PROT 5.9*  ?ALBUMIN 3.2*  ? ? ?Urine analysis: ?   ?Component Value Date/Time  ? [place] 07/29/2021 2014  ? APPEARANCEUR CLEAR 07/29/2021 2014  ? LABSPEC 1.010 07/29/2021 2014  ? PHURINE 6.5 07/29/2021 2014  ? GLUCOSEU 100 (A) 07/29/2021 2014  ? HGBUR SMALL (A) 07/29/2021 2014  ? BILIRUBINUR SMALL (A) 07/29/2021 2014  ? Benjamin Stain NEGATIVE 07/29/2021 2014  ? PROTEINUR 30 (A) 07/29/2021 2014  ? NITRITE POSITIVE (A) 07/29/2021 2014  ? LEUKOCYTESUR LARGE (A) 07/29/2021 2014  ? ? ?Radiological Exams on Admission: ?DG Elbow Complete Left ? ?Result Date: 10/22/2021 ?CLINICAL DATA:  Fall EXAM: LEFT ELBOW - COMPLETE 3+ VIEW COMPARISON:  None. FINDINGS: No acute bony abnormality. Specifically, no fracture, subluxation, or dislocation. No joint effusion. Soft tissues are intact. IMPRESSION: Negative. Electronically Signed   By: Rolm Baptise M.D.   On: 10/22/2021 22:22  ? ?CT Head Wo Contrast ? ?Result Date: 10/22/2021 ?CLINICAL DATA:  Fall.  Head trauma, minor (Age >= 65y) EXAM: CT HEAD WITHOUT CONTRAST TECHNIQUE: Contiguous axial images were obtained from the base of the skull through the vertex without intravenous contrast. RADIATION DOSE REDUCTION: This exam was  performed according to the departmental dose-optimization program which includes automated exposure control, adjustment of the mA and/or kV according to patient size and/or use of iterative reconstruction technique

## 2021-10-23 ENCOUNTER — Inpatient Hospital Stay (HOSPITAL_COMMUNITY): Payer: Medicare Other

## 2021-10-23 ENCOUNTER — Inpatient Hospital Stay (HOSPITAL_COMMUNITY): Payer: Medicare Other | Admitting: Certified Registered Nurse Anesthetist

## 2021-10-23 ENCOUNTER — Encounter (HOSPITAL_COMMUNITY): Payer: Self-pay | Admitting: Internal Medicine

## 2021-10-23 ENCOUNTER — Ambulatory Visit: Payer: Self-pay | Admitting: Student

## 2021-10-23 ENCOUNTER — Other Ambulatory Visit: Payer: Self-pay

## 2021-10-23 ENCOUNTER — Encounter (HOSPITAL_COMMUNITY)
Admission: EM | Disposition: A | Discharge status: Skilled Nursing Facility | Payer: Self-pay | Source: Home / Self Care | Attending: Internal Medicine

## 2021-10-23 DIAGNOSIS — D638 Anemia in other chronic diseases classified elsewhere: Secondary | ICD-10-CM

## 2021-10-23 DIAGNOSIS — E039 Hypothyroidism, unspecified: Secondary | ICD-10-CM

## 2021-10-23 DIAGNOSIS — I10 Essential (primary) hypertension: Secondary | ICD-10-CM

## 2021-10-23 DIAGNOSIS — S72002A Fracture of unspecified part of neck of left femur, initial encounter for closed fracture: Secondary | ICD-10-CM

## 2021-10-23 HISTORY — PX: ANTERIOR APPROACH HEMI HIP ARTHROPLASTY: SHX6690

## 2021-10-23 LAB — CBC
HCT: 31.6 % — ABNORMAL LOW (ref 39.0–52.0)
Hemoglobin: 10.3 g/dL — ABNORMAL LOW (ref 13.0–17.0)
MCH: 37.1 pg — ABNORMAL HIGH (ref 26.0–34.0)
MCHC: 32.6 g/dL (ref 30.0–36.0)
MCV: 113.7 fL — ABNORMAL HIGH (ref 80.0–100.0)
Platelets: 156 10*3/uL (ref 150–400)
RBC: 2.78 MIL/uL — ABNORMAL LOW (ref 4.22–5.81)
RDW: 13.5 % (ref 11.5–15.5)
WBC: 12.2 10*3/uL — ABNORMAL HIGH (ref 4.0–10.5)
nRBC: 0 % (ref 0.0–0.2)

## 2021-10-23 LAB — VITAMIN B12: Vitamin B-12: 388 pg/mL (ref 180–914)

## 2021-10-23 LAB — SURGICAL PCR SCREEN
MRSA, PCR: POSITIVE — AB
Staphylococcus aureus: POSITIVE — AB

## 2021-10-23 LAB — BASIC METABOLIC PANEL
Anion gap: 7 (ref 5–15)
Anion gap: 6 (ref 5–15)
BUN: 14 mg/dL (ref 8–23)
BUN: 10 mg/dL (ref 8–23)
CO2: 24 mmol/L (ref 22–32)
CO2: 24 mmol/L (ref 22–32)
Calcium: 9.1 mg/dL (ref 8.9–10.3)
Calcium: 9.1 mg/dL (ref 8.9–10.3)
Chloride: 104 mmol/L (ref 98–111)
Chloride: 105 mmol/L (ref 98–111)
Creatinine, Ser: 0.86 mg/dL (ref 0.61–1.24)
Creatinine, Ser: 0.84 mg/dL (ref 0.61–1.24)
GFR, Estimated: >60 mL/min (ref >60)
GFR, Estimated: >60 mL/min (ref >60)
Glucose, Bld: 140 mg/dL — ABNORMAL HIGH (ref 70–99)
Glucose, Bld: 113 mg/dL — ABNORMAL HIGH (ref 70–99)
Potassium: 4.5 mmol/L (ref 3.5–5.1)
Potassium: 4.1 mmol/L (ref 3.5–5.1)
Sodium: 135 mmol/L (ref 135–145)
Sodium: 135 mmol/L (ref 135–145)

## 2021-10-23 LAB — FOLATE: Folate: 10.7 ng/mL (ref >5.9)

## 2021-10-23 SURGERY — HEMIARTHROPLASTY, HIP, DIRECT ANTERIOR APPROACH, FOR FRACTURE
Anesthesia: General | Site: Hip | Laterality: Left

## 2021-10-23 MED ORDER — PROPOFOL 10 MG/ML IV BOLUS
INTRAVENOUS | Status: AC
  Filled 2021-10-23: qty 20

## 2021-10-23 MED ORDER — PHENYLEPHRINE 80 MCG/ML (10ML) SYRINGE FOR IV PUSH (FOR BLOOD PRESSURE SUPPORT)
PREFILLED_SYRINGE | INTRAVENOUS | Status: AC
  Filled 2021-10-23: qty 10

## 2021-10-23 MED ORDER — POVIDONE-IODINE 10 % EX SWAB
2.0000 | Freq: Once | CUTANEOUS | Status: AC
Start: 2021-10-23 — End: 2021-10-23
  Administered 2021-10-23: 2 via TOPICAL

## 2021-10-23 MED ORDER — DEXAMETHASONE SODIUM PHOSPHATE 10 MG/ML IJ SOLN
INTRAMUSCULAR | Status: AC
  Filled 2021-10-23: qty 2

## 2021-10-23 MED ORDER — ONDANSETRON HCL 4 MG/2ML IJ SOLN: 4.0000 mg | Freq: Once | INTRAMUSCULAR | Status: DC | PRN

## 2021-10-23 MED ORDER — LACTATED RINGERS IV SOLN: INTRAVENOUS | Status: DC

## 2021-10-23 MED ORDER — FENTANYL CITRATE (PF) 250 MCG/5ML IJ SOLN
INTRAMUSCULAR | Status: DC | PRN
  Administered 2021-10-23: 100 ug via INTRAVENOUS
  Administered 2021-10-23: 50 ug via INTRAVENOUS

## 2021-10-23 MED ORDER — EPHEDRINE SULFATE (PRESSORS) 50 MG/ML IJ SOLN
INTRAMUSCULAR | Status: DC | PRN
  Administered 2021-10-23 (×2): 5 mg via INTRAVENOUS

## 2021-10-23 MED ORDER — ENOXAPARIN SODIUM 40 MG/0.4ML IJ SOSY
40.0000 mg | PREFILLED_SYRINGE | INTRAMUSCULAR | Status: DC
  Administered 2021-10-24 – 2021-10-26 (×3): 40 mg via SUBCUTANEOUS
  Filled 2021-10-23 (×3): qty 0.4

## 2021-10-23 MED ORDER — KETOROLAC TROMETHAMINE 30 MG/ML IJ SOLN
INTRAMUSCULAR | Status: DC | PRN
  Administered 2021-10-23: 30 mg

## 2021-10-23 MED ORDER — POVIDONE-IODINE 10 % EX SWAB: 2.0000 "application " | Freq: Once | CUTANEOUS | Status: DC

## 2021-10-23 MED ORDER — SODIUM CHLORIDE 0.9 % IV SOLN: INTRAVENOUS | Status: DC

## 2021-10-23 MED ORDER — LIDOCAINE 2% (20 MG/ML) 5 ML SYRINGE
INTRAMUSCULAR | Status: DC | PRN
  Administered 2021-10-23: 60 mg via INTRAVENOUS

## 2021-10-23 MED ORDER — EPHEDRINE 5 MG/ML INJ
INTRAVENOUS | Status: AC
  Filled 2021-10-23: qty 5

## 2021-10-23 MED ORDER — KETOROLAC TROMETHAMINE 30 MG/ML IJ SOLN
INTRAMUSCULAR | Status: AC
  Filled 2021-10-23: qty 1

## 2021-10-23 MED ORDER — ONDANSETRON HCL 4 MG/2ML IJ SOLN
INTRAMUSCULAR | Status: AC
  Filled 2021-10-23: qty 2

## 2021-10-23 MED ORDER — ROCURONIUM BROMIDE 10 MG/ML (PF) SYRINGE
PREFILLED_SYRINGE | INTRAVENOUS | Status: AC
  Filled 2021-10-23: qty 10

## 2021-10-23 MED ORDER — WARFARIN SODIUM 4 MG PO TABS
4.0000 mg | ORAL_TABLET | Freq: Once | ORAL | Status: AC
  Administered 2021-10-23: 4 mg via ORAL
  Filled 2021-10-23: qty 1

## 2021-10-23 MED ORDER — ACETAMINOPHEN 500 MG PO TABS
ORAL_TABLET | ORAL | Status: AC
  Filled 2021-10-23: qty 2

## 2021-10-23 MED ORDER — PHENYLEPHRINE HCL-NACL 20-0.9 MG/250ML-% IV SOLN
INTRAVENOUS | Status: DC | PRN
  Administered 2021-10-23: 25 ug/min via INTRAVENOUS

## 2021-10-23 MED ORDER — SUGAMMADEX SODIUM 200 MG/2ML IV SOLN
INTRAVENOUS | Status: DC | PRN
  Administered 2021-10-23: 200 mg via INTRAVENOUS

## 2021-10-23 MED ORDER — PROPOFOL 10 MG/ML IV BOLUS
INTRAVENOUS | Status: DC | PRN
  Administered 2021-10-23: 50 mg via INTRAVENOUS

## 2021-10-23 MED ORDER — ONDANSETRON HCL 4 MG/2ML IJ SOLN
INTRAMUSCULAR | Status: DC | PRN
  Administered 2021-10-23: 4 mg via INTRAVENOUS

## 2021-10-23 MED ORDER — SENNA 8.6 MG PO TABS
1.0000 | ORAL_TABLET | Freq: Two times a day (BID) | ORAL | Status: DC
  Administered 2021-10-23 – 2021-10-27 (×5): 8.6 mg via ORAL
  Filled 2021-10-23 (×9): qty 1

## 2021-10-23 MED ORDER — DEXAMETHASONE SODIUM PHOSPHATE 10 MG/ML IJ SOLN
INTRAMUSCULAR | Status: DC | PRN
  Administered 2021-10-23: 5 mg via INTRAVENOUS

## 2021-10-23 MED ORDER — CEFAZOLIN SODIUM-DEXTROSE 2-4 GM/100ML-% IV SOLN
INTRAVENOUS | Status: AC
Start: 2021-10-23 — End: 2021-10-23
  Filled 2021-10-23: qty 100

## 2021-10-23 MED ORDER — BUPIVACAINE-EPINEPHRINE (PF) 0.5% -1:200000 IJ SOLN
INTRAMUSCULAR | Status: DC | PRN
  Administered 2021-10-23: 30 mL

## 2021-10-23 MED ORDER — SODIUM CHLORIDE (PF) 0.9 % IJ SOLN
INTRAMUSCULAR | Status: DC | PRN
  Administered 2021-10-23: 30 mL

## 2021-10-23 MED ORDER — VITAMIN K1 10 MG/ML IJ SOLN
10.0000 mg | Freq: Once | INTRAVENOUS | Status: AC
  Administered 2021-10-23: 10 mg via INTRAVENOUS
  Filled 2021-10-23: qty 1

## 2021-10-23 MED ORDER — ROCURONIUM BROMIDE 10 MG/ML (PF) SYRINGE
PREFILLED_SYRINGE | INTRAVENOUS | Status: DC | PRN
  Administered 2021-10-23: 30 mg via INTRAVENOUS

## 2021-10-23 MED ORDER — POLYETHYLENE GLYCOL 3350 17 G PO PACK: 17.0000 g | PACK | Freq: Every day | ORAL | Status: DC | PRN

## 2021-10-23 MED ORDER — ACETAMINOPHEN 500 MG PO TABS
1000.0000 mg | ORAL_TABLET | Freq: Once | ORAL | Status: DC
Start: 2021-10-23 — End: 2021-10-23

## 2021-10-23 MED ORDER — LIDOCAINE 2% (20 MG/ML) 5 ML SYRINGE
INTRAMUSCULAR | Status: AC
  Filled 2021-10-23: qty 10

## 2021-10-23 MED ORDER — TRANEXAMIC ACID-NACL 1000-0.7 MG/100ML-% IV SOLN
INTRAVENOUS | Status: AC
  Filled 2021-10-23: qty 100

## 2021-10-23 MED ORDER — WARFARIN - PHARMACIST DOSING INPATIENT: Freq: Every day | Status: DC

## 2021-10-23 MED ORDER — ACETAMINOPHEN 10 MG/ML IV SOLN
INTRAVENOUS | Status: DC | PRN
  Administered 2021-10-23: 1000 mg via INTRAVENOUS

## 2021-10-23 MED ORDER — MENTHOL 3 MG MT LOZG: 1.0000 | LOZENGE | OROMUCOSAL | Status: DC | PRN

## 2021-10-23 MED ORDER — AMISULPRIDE (ANTIEMETIC) 5 MG/2ML IV SOLN: 10.0000 mg | Freq: Once | INTRAVENOUS | Status: DC | PRN

## 2021-10-23 MED ORDER — ACETAMINOPHEN 10 MG/ML IV SOLN: 1000.0000 mg | Freq: Once | INTRAVENOUS | Status: DC | PRN

## 2021-10-23 MED ORDER — DOCUSATE SODIUM 100 MG PO CAPS
100.0000 mg | ORAL_CAPSULE | Freq: Two times a day (BID) | ORAL | Status: DC
  Administered 2021-10-23 – 2021-10-27 (×5): 100 mg via ORAL
  Filled 2021-10-23 (×8): qty 1

## 2021-10-23 MED ORDER — CEFAZOLIN SODIUM-DEXTROSE 2-4 GM/100ML-% IV SOLN
2.0000 g | INTRAVENOUS | Status: AC
  Administered 2021-10-23: 2 g via INTRAVENOUS

## 2021-10-23 MED ORDER — ALUM & MAG HYDROXIDE-SIMETH 200-200-20 MG/5ML PO SUSP: 30.0000 mL | ORAL | Status: DC | PRN

## 2021-10-23 MED ORDER — PHENOL 1.4 % MT LIQD: 1.0000 | OROMUCOSAL | Status: DC | PRN

## 2021-10-23 MED ORDER — SODIUM CHLORIDE 0.9 % IR SOLN
Status: DC | PRN
  Administered 2021-10-23: 3000 mL

## 2021-10-23 MED ORDER — TRANEXAMIC ACID-NACL 1000-0.7 MG/100ML-% IV SOLN
1000.0000 mg | INTRAVENOUS | Status: AC
  Administered 2021-10-23: 1000 mg via INTRAVENOUS

## 2021-10-23 MED ORDER — 0.9 % SODIUM CHLORIDE (POUR BTL) OPTIME
TOPICAL | Status: DC | PRN
  Administered 2021-10-23: 1000 mL

## 2021-10-23 MED ORDER — FENTANYL CITRATE (PF) 100 MCG/2ML IJ SOLN: 25.0000 ug | INTRAMUSCULAR | Status: DC | PRN

## 2021-10-23 MED ORDER — DIPHENHYDRAMINE HCL 12.5 MG/5ML PO ELIX: 12.5000 mg | ORAL_SOLUTION | ORAL | Status: DC | PRN

## 2021-10-23 MED ORDER — ALBUMIN HUMAN 5 % IV SOLN: INTRAVENOUS | Status: DC | PRN

## 2021-10-23 MED ORDER — CHLORHEXIDINE GLUCONATE 0.12 % MT SOLN
OROMUCOSAL | Status: AC
  Administered 2021-10-23: 15 mL
  Filled 2021-10-23: qty 15

## 2021-10-23 MED ORDER — IRRISEPT - 450ML BOTTLE WITH 0.05% CHG IN STERILE WATER, USP 99.95% OPTIME
TOPICAL | Status: DC | PRN
  Administered 2021-10-23: 450 mL

## 2021-10-23 MED ORDER — MORPHINE SULFATE (PF) 2 MG/ML IV SOLN
1.0000 mg | INTRAVENOUS | Status: DC | PRN
  Administered 2021-10-23 (×2): 2 mg via INTRAVENOUS
  Filled 2021-10-23 (×2): qty 1

## 2021-10-23 MED ORDER — CHLORHEXIDINE GLUCONATE 4 % EX LIQD: 60.0000 mL | Freq: Once | CUTANEOUS | Status: DC

## 2021-10-23 MED ORDER — FENTANYL CITRATE (PF) 250 MCG/5ML IJ SOLN
INTRAMUSCULAR | Status: AC
  Filled 2021-10-23: qty 5

## 2021-10-23 MED ORDER — BUPIVACAINE-EPINEPHRINE 0.5% -1:200000 IJ SOLN
INTRAMUSCULAR | Status: AC
  Filled 2021-10-23: qty 1

## 2021-10-23 SURGICAL SUPPLY — 72 items
ALCOHOL 70% 16 OZ (MISCELLANEOUS) ×2 IMPLANT
BAG COUNTER SPONGE SURGICOUNT (BAG) ×2 IMPLANT
BLADE CLIPPER SURG (BLADE) IMPLANT
CHLORAPREP W/TINT 26 (MISCELLANEOUS) ×2 IMPLANT
COVER SURGICAL LIGHT HANDLE (MISCELLANEOUS) ×2 IMPLANT
DERMABOND ADVANCED (GAUZE/BANDAGES/DRESSINGS) ×1
DERMABOND ADVANCED .7 DNX12 (GAUZE/BANDAGES/DRESSINGS) ×2 IMPLANT
DRAIN JACKSON PRATT 10MM FLAT (MISCELLANEOUS) ×1 IMPLANT
DRAPE C-ARM 42X72 X-RAY (DRAPES) ×2 IMPLANT
DRAPE STERI IOBAN 125X83 (DRAPES) ×2 IMPLANT
DRAPE U-SHAPE 47X51 STRL (DRAPES) ×6 IMPLANT
DRSG AQUACEL AG ADV 3.5X10 (GAUZE/BANDAGES/DRESSINGS) ×2 IMPLANT
DRSG TEGADERM 4X4.75 (GAUZE/BANDAGES/DRESSINGS) ×1 IMPLANT
ELECT BLADE 4.0 EZ CLEAN MEGAD (MISCELLANEOUS) ×2
ELECT PENCIL ROCKER SW 15FT (MISCELLANEOUS) ×2 IMPLANT
ELECT REM PT RETURN 9FT ADLT (ELECTROSURGICAL) ×2
ELECTRODE BLDE 4.0 EZ CLN MEGD (MISCELLANEOUS) ×1 IMPLANT
ELECTRODE REM PT RTRN 9FT ADLT (ELECTROSURGICAL) ×1 IMPLANT
EVACUATOR 1/8 PVC DRAIN (DRAIN) ×1 IMPLANT
GAUZE SPONGE 2X2 8PLY STRL LF (GAUZE/BANDAGES/DRESSINGS) IMPLANT
GLOVE BIO SURGEON STRL SZ8.5 (GLOVE) ×4 IMPLANT
GLOVE BIOGEL M 7.0 STRL (GLOVE) ×2 IMPLANT
GLOVE BIOGEL PI IND STRL 7.5 (GLOVE) ×1 IMPLANT
GLOVE BIOGEL PI IND STRL 8.5 (GLOVE) ×1 IMPLANT
GLOVE BIOGEL PI INDICATOR 7.5 (GLOVE) ×1
GLOVE BIOGEL PI INDICATOR 8.5 (GLOVE) ×1
GOWN STRL REUS W/ TWL LRG LVL3 (GOWN DISPOSABLE) ×2 IMPLANT
GOWN STRL REUS W/ TWL XL LVL3 (GOWN DISPOSABLE) ×1 IMPLANT
GOWN STRL REUS W/TWL 2XL LVL3 (GOWN DISPOSABLE) ×2 IMPLANT
GOWN STRL REUS W/TWL LRG LVL3 (GOWN DISPOSABLE) ×2
GOWN STRL REUS W/TWL XL LVL3 (GOWN DISPOSABLE) ×1
HANDPIECE INTERPULSE COAX TIP (DISPOSABLE)
HEAD MODULAR STD 28MM (Orthopedic Implant) ×1 IMPLANT
HOOD PEEL AWAY FACE SHEILD DIS (HOOD) ×4 IMPLANT
JET LAVAGE IRRISEPT WOUND (IRRIGATION / IRRIGATOR) ×4
KIT BASIN OR (CUSTOM PROCEDURE TRAY) ×2 IMPLANT
KIT TURNOVER KIT B (KITS) ×2 IMPLANT
LAVAGE JET IRRISEPT WOUND (IRRIGATION / IRRIGATOR) ×1 IMPLANT
MANIFOLD NEPTUNE II (INSTRUMENTS) ×2 IMPLANT
MARKER SKIN DUAL TIP RULER LAB (MISCELLANEOUS) ×4 IMPLANT
NDL SPNL 18GX3.5 QUINCKE PK (NEEDLE) ×1 IMPLANT
NEEDLE SPNL 18GX3.5 QUINCKE PK (NEEDLE) ×2 IMPLANT
NS IRRIG 1000ML POUR BTL (IV SOLUTION) ×2 IMPLANT
PACK TOTAL JOINT (CUSTOM PROCEDURE TRAY) ×2 IMPLANT
PACK UNIVERSAL I (CUSTOM PROCEDURE TRAY) ×2 IMPLANT
PAD ARMBOARD 7.5X6 YLW CONV (MISCELLANEOUS) ×4 IMPLANT
RINGBLOC BI POLAR 28X50MM (Orthopedic Implant) ×2 IMPLANT
SAW OSC TIP CART 19.5X105X1.3 (SAW) ×2 IMPLANT
SEALER BIPOLAR AQUA 6.0 (INSTRUMENTS) ×1 IMPLANT
SET HNDPC FAN SPRY TIP SCT (DISPOSABLE) ×1 IMPLANT
SHELL RINGBLOC BI POLR 28X50MM (Orthopedic Implant) IMPLANT
SOL PREP POV-IOD 4OZ 10% (MISCELLANEOUS) ×2 IMPLANT
SPONGE GAUZE 2X2 STER 10/PKG (GAUZE/BANDAGES/DRESSINGS) ×1
STAPLER VISISTAT 35W (STAPLE) ×1 IMPLANT
STEM FEM DIST PS 20X160 T1 (Stem) ×1 IMPLANT
SUT ETHIBOND NAB CT1 #1 30IN (SUTURE) ×4 IMPLANT
SUT ETHILON 3 0 FSL (SUTURE) ×1 IMPLANT
SUT MNCRL AB 3-0 PS2 18 (SUTURE) ×2 IMPLANT
SUT MON AB 2-0 CT1 36 (SUTURE) ×2 IMPLANT
SUT VIC AB 1 CT1 27 (SUTURE) ×1
SUT VIC AB 1 CT1 27XBRD ANBCTR (SUTURE) ×1 IMPLANT
SUT VIC AB 2-0 CT1 27 (SUTURE) ×1
SUT VIC AB 2-0 CT1 TAPERPNT 27 (SUTURE) ×1 IMPLANT
SUT VLOC 180 0 24IN GS25 (SUTURE) ×2 IMPLANT
SYR 50ML LL SCALE MARK (SYRINGE) ×2 IMPLANT
TOWEL GREEN STERILE (TOWEL DISPOSABLE) ×2 IMPLANT
TOWEL GREEN STERILE FF (TOWEL DISPOSABLE) ×2 IMPLANT
TRAY CATH 16FR W/PLASTIC CATH (SET/KITS/TRAYS/PACK) IMPLANT
TRAY FOLEY W/BAG SLVR 16FR (SET/KITS/TRAYS/PACK)
TRAY FOLEY W/BAG SLVR 16FR ST (SET/KITS/TRAYS/PACK) IMPLANT
TUBE SUCTION HIGH CAP CLEAR NV (SUCTIONS) ×1 IMPLANT
WATER STERILE IRR 1000ML POUR (IV SOLUTION) ×6 IMPLANT

## 2021-10-23 NOTE — Transfer of Care (Signed)
Immediate Anesthesia Transfer of Care Note ? ?Patient: Craig Snow ? ?Procedure(s) Performed: ANTERIOR APPROACH HEMI HIP ARTHROPLASTY (Left: Hip) ? ?Patient Location: PACU ? ?Anesthesia Type:General ? ?Level of Consciousness: awake and drowsy ? ?Airway & Oxygen Therapy: Patient Spontanous Breathing and Patient connected to face mask oxygen ? ?Post-op Assessment: Report given to RN and Post -op Vital signs reviewed and stable ? ?Post vital signs: Reviewed and stable ? ?Last Vitals:  ?Vitals Value Taken Time  ?BP    ?Temp    ?Pulse    ?Resp    ?SpO2    ? ? ?Last Pain:  ?Vitals:  ? 10/23/21 1051  ?TempSrc: Oral  ?PainSc:   ?   ? ?  ? ?Complications: No notable events documented. ?

## 2021-10-23 NOTE — Anesthesia Preprocedure Evaluation (Addendum)
Anesthesia Evaluation  ?Patient identified by MRN, date of birth, ID band ?Patient awake ? ? ? ?Reviewed: ?Allergy & Precautions, NPO status , Patient's Chart, lab work & pertinent test results ? ?Airway ?Mallampati: III ? ?TM Distance: >3 FB ?Neck ROM: Full ? ? ? Dental ? ?(+) Missing ?H/o upper palate cancer:   ?Pulmonary ?former smoker,  ?  ?Pulmonary exam normal ? ? ? ? ? ? ? Cardiovascular ?hypertension, Pt. on home beta blockers ?Normal cardiovascular exam+ dysrhythmias Atrial Fibrillation  ? ? ?  ?Neuro/Psych ?negative neurological ROS ? negative psych ROS  ? GI/Hepatic ?negative GI ROS, Neg liver ROS,   ?Endo/Other  ?Hypothyroidism  ? Renal/GU ?History of bladder cancer  ? ?  ?Musculoskeletal ?Stage 1 skin ulcer of sacral region   ? Abdominal ?  ?Peds ? Hematology ? ?(+) Blood dyscrasia, anemia , INR. 1.8   ?Anesthesia Other Findings ?Left hip fracture ? Reproductive/Obstetrics ? ?  ? ? ? ? ? ? ? ? ? ? ? ? ? ?  ?  ? ? ? ? ? ? ?Anesthesia Physical ?Anesthesia Plan ? ?ASA: 3 ? ?Anesthesia Plan: General  ? ?Post-op Pain Management:   ? ?Induction: Intravenous ? ?PONV Risk Score and Plan: 2 and Ondansetron, Dexamethasone and Treatment may vary due to age or medical condition ? ?Airway Management Planned: Oral ETT and Video Laryngoscope Planned ? ?Additional Equipment:  ? ?Intra-op Plan:  ? ?Post-operative Plan: Extubation in OR ? ?Informed Consent: I have reviewed the patients History and Physical, chart, labs and discussed the procedure including the risks, benefits and alternatives for the proposed anesthesia with the patient or authorized representative who has indicated his/her understanding and acceptance.  ? ?Patient has DNR.  ?Discussed DNR with patient and Suspend DNR. ?  ?Dental advisory given ? ?Plan Discussed with: CRNA ? ?Anesthesia Plan Comments:   ? ? ? ? ? ?Anesthesia Quick Evaluation ? ?

## 2021-10-23 NOTE — Op Note (Signed)
OPERATIVE REPORT ? ?SURGEON: Rod Can, MD  ? ?ASSISTANT: Larene Pickett, Pa-C ? ?PREOPERATIVE DIAGNOSIS: Displaced Left femoral neck fracture.  ? ?POSTOPERATIVE DIAGNOSIS: Displaced Left femoral neck fracture.  ? ?PROCEDURE: Left hip hemiarthroplasty, anterior approach.  ? ?IMPLANTS: Biomet Taperloc Complete Reduced Distal stem, size 20 x 160 mm, high offset, with a 50 mm bipolar outer bearing and a 28 + 0 mm metal head ball. ? ?ANESTHESIA:  GA combined with regional for post-op pain. ? ?ANTIBIOTICS: 2g ancef. ? ?ESTIMATED BLOOD LOSS:-250 mL   ? ?DRAINS: 10 mm flat JP drain in the subcutaneous tissue. ? ?COMPLICATIONS: None ?  ?CONDITION: PACU - hemodynamically stable.  ? ?BRIEF CLINICAL NOTE: Craig Snow is a 88 y.o. male with a displaced Left femoral neck fracture. The patient was admitted to the hospitalist service and underwent perioperative risk stratification and medical optimization. Patient takes warfarin for atrial fibrillation. INR on admission was 1.8, so 10 mg vitamin K was given. The risks, benefits, and alternatives to hemiarthroplasty were explained, and the patient elected to proceed. ? ?PROCEDURE IN DETAIL: Regional block was placed by anesthesia in the holding room. The patient was taken to the operating room, and general anesthesia was induced on the hospital bed.  The patient was then positioned on the Hana table.  All bony prominences were well padded.  The hip was prepped and draped in the normal sterile surgical fashion.  A time-out was called verifying side and site of surgery. Antibiotics were given within 60 minutes of beginning the procedure. ?  ?Bikini incision was made, and the direct anterior approach to the hip was performed through the Hueter interval.  Superficial dissection was carried out lateral to the ASIS. Lateral femoral circumflex vessels were treated with the Auqumantys. The anterior capsule was exposed and an inverted T capsulotomy was made. Fracture hematoma was  encountered and evacuated. The patient was found to have a comminuted Left subcapital femoral neck fracture.  Inferior pubofemoral ligament was released subperiosteally to the lesser trochanter. I freshened the femoral neck cut with a saw.  I removed the femoral neck fragment.  A corkscrew was placed into the head and the head was removed.  This was passed to the back table and was measured. ?  ?Acetabular exposure was achieved.  I examined the articular cartilage which was intact.  The labrum was intact. A 50 mm trial head was placed and found to have excellent fit. ?  ?I then gained femoral exposure taking care to protect the abductors and greater trochanter.  The superior capsule was incised longitudinally, staying lateral to the posterior border of the femoral neck. External rotation, extension, and adduction were applied.  A cookie cutter was used to enter the femoral canal, and then the femoral canal finder was used to confirm location.  I then sequentially broached up to a size 20.  Calcar planer was used on the femoral neck remnant.  I placed a high offset neck and a 50 mm bipolar trial head ball. The hip was reduced.  Leg lengths were checked fluoroscopically.  The hip was dislocated and trial components were removed.  I placed the real stem followed by the bipolar construct.  A single reduction maneuver was performed and the hip was reduced.  Fluoroscopy was used to confirm component position and leg lengths.  At 90 degrees of external rotation and extension, the hip was stable to an anterior directed force. ?  ?The wound was copiously irrigated with Irrisept solution and normal saline using  pulse lavage.  Marcaine solution was injected into the periarticular soft tissue.  A 10 mm flat JP drain was placed into the subcutaneous tissue through a stab incision over the lateral thigh, and was sewn in with a 3-0 Nylon suture. The wound was closed in layers using #1 V-Loc for the fascia, 2-0 Vicryl for the  subcutaneous fat, 2-0 Monocryl for the deep dermal layer, and staples + Dermabond for the skin.  Once the glue was fully dried, an Aquacell Ag dressing was applied.  The patient was then awakened from anesthesia and transported to the recovery room in stable condition.  Sponge, needle, and instrument counts were correct at the end of the case x2.  The patient tolerated the procedure well and there were no known complications. ? ?Please note that a surgical assistant was a medical necessity for this procedure to perform it in a safe and expeditious manner. Assistant was necessary to provide appropriate retraction of vital neurovascular structures, to prevent femoral fracture, and to allow for anatomic placement of the prosthesis. ? ?POSTOPERATIVE PLAN: The patient will be readmitted to the hospitalist.  He may weight-bear as tolerated with assist device.  Routine prophylactic IV antibiotics.  Mobilize out of bed with PT/OT.  Beginning tonight, resume home Coumadin dose.  Lovenox bridge to begin tomorrow.  He will need JP drain care every 4 hours.  Plan to discharge him home with the JP drain due to chronic anticoagulant usage.  He will undergo disposition planning.  I will see him back in the office in 2 weeks for routine postoperative care, and to remove the JP drain. ?

## 2021-10-23 NOTE — Consult Note (Signed)
? ?ORTHOPAEDIC CONSULTATION ? ?REQUESTING PHYSICIAN: Shelly Coss, MD ? ?PCP:  Pcp, No ? ?Chief Complaint: Fall, left hip pain ? ?HPI: ?Craig Snow is a 88 y.o. male who complains of hip pain after a fall . Patient is on warfarin and has a history of atrial fibrillation.  He was also previously diagnosed with bladder cancer 6 months ago and underwent TURP per previous notes.  He was discharged from the hospital in February after being seen for pneumonia, UTI.  He was recently discharged from a rehab facility 2 days ago and was at home at the time of his fall.  He is accompanied by his son today, reports that the plan was for him to move in with his son.  Today reports hip pain with any motion and some pain around the knee.  Primarily walks with a walker at baseline. ? ?Past Medical History:  ?Diagnosis Date  ? Acute lower UTI 07/30/2021  ? Atrial fibrillation ([place])   ? Candidiasis of penis 07/30/2021  ? Community acquired pneumonia 07/30/2021  ? Hyperlipidemia   ? Hypertension   ? Supratherapeutic INR 07/30/2021  ? Thyrotoxicosis   ? ?History reviewed. No pertinent surgical history. ?Social History  ? ?Socioeconomic History  ? Marital status: Widowed  ?  Spouse name: Not on file  ? Number of children: Not on file  ? Years of education: Not on file  ? Highest education level: Not on file  ?Occupational History  ? Not on file  ?Tobacco Use  ? Smoking status: Former  ?  Types: Cigarettes  ?  Quit date: 85  ?  Years since quitting: 50.3  ? Smokeless tobacco: Never  ?Substance and Sexual Activity  ? Alcohol use: Not Currently  ? Drug use: Not Currently  ? Sexual activity: Not on file  ?Other Topics Concern  ? Not on file  ?Social History Narrative  ? Not on file  ? ?Social Determinants of Health  ? ?Financial Resource Strain: Not on file  ?Food Insecurity: Not on file  ?Transportation Needs: Not on file  ?Physical Activity: Not on file  ?Stress: Not on file  ?Social Connections: Not on file  ? ?History reviewed. No  pertinent family history. ?Allergies  ?Allergen Reactions  ? Aspirin Other (See Comments)  ?  Unknown reaction - listed on [facility] 07/30/21  ? Clindamycin/Lincomycin Other (See Comments)  ?  Unknown reaction - listed on [facility] 07/30/21  ? Other Other (See Comments)  ?  Unknown reaction to opioids - listed on [facility] 07/30/21  ? Sulfa Antibiotics Other (See Comments)  ?  Unknown reaction - listed on [facility] 07/30/21  ? ?Prior to Admission medications   ?Medication Sig Start Date End Date Taking? Authorizing Provider  ?acetaminophen (TYLENOL) 500 MG tablet Take 1,000 mg by mouth every 6 (six) hours as needed (pain).   Yes [provider]  ?atorvastatin (LIPITOR) 10 MG tablet Take 10 mg by mouth at bedtime.   Yes [provider]  ?Docusate Sodium (COLACE PO) Take 2 capsules by mouth daily as needed (constipation).   Yes [provider]  ?furosemide (LASIX) 20 MG tablet Take 20 mg by mouth every morning.   Yes [provider]  ?levothyroxine (SYNTHROID) 50 MCG tablet Take 50 mcg by mouth every morning.   Yes [provider]  ?liothyronine (CYTOMEL) 5 MCG tablet Take 5 mcg by mouth every morning.   Yes [provider]  ?Melatonin 10 MG TABS Take 10 mg by mouth at bedtime  as needed (sleep).   Yes [provider]  ?metoprolol tartrate (LOPRESSOR) 25 MG tablet Take 1 tablet (25 mg total) by mouth 2 (two) times daily. 08/06/21  Yes Shawna Clamp, MD  ?mirabegron ER (MYRBETRIQ) 25 MG TB24 tablet Take 1 tablet (25 mg total) by mouth daily. 08/07/21  Yes Shawna Clamp, MD  ?mirtazapine (REMERON) 15 MG tablet Take 15 mg by mouth at bedtime.   Yes [provider]  ?pantoprazole (PROTONIX) 40 MG tablet Take 40 mg by mouth every morning.   Yes [provider]  ?phenazopyridine (PYRIDIUM) 200 MG tablet Take 200 mg by mouth every 8 (eight) hours as needed for pain. 07/22/21  Yes [provider]  ?polyethylene glycol (MIRALAX / GLYCOLAX) 17 g packet Take 17 g by mouth  every morning.   Yes [provider]  ?potassium chloride (KLOR-CON M) 10 MEQ tablet Take 10 mEq by mouth every morning.   Yes [provider]  ?tamsulosin (FLOMAX) 0.4 MG CAPS capsule Take 0.4 mg by mouth every morning.   Yes [provider]  ?traMADol (ULTRAM) 50 MG tablet Take 50 mg by mouth every 8 (eight) hours as needed for moderate pain or severe pain. 07/22/21  Yes [provider]  ?warfarin (COUMADIN) 2 MG tablet Take 2 mg by mouth See admin instructions. Take one tablet (2 mg) by mouth in the morning on Sunday and Tuesday morning (take 3 mg on Monday, Wednesday, Thursday, Friday)   Yes [provider]  ?warfarin (COUMADIN) 3 MG tablet Take 3 mg by mouth See admin instructions. Take one tablet (3 mg) by mouth on Monday, Wednesday, Thursday, Friday morning (take 2 mg on Sunday and Tuesday)   Yes [provider]  ? ?DG Elbow Complete Left ? ?Result Date: 10/22/2021 ?CLINICAL DATA:  Fall EXAM: LEFT ELBOW - COMPLETE 3+ VIEW COMPARISON:  None. FINDINGS: No acute bony abnormality. Specifically, no fracture, subluxation, or dislocation. No joint effusion. Soft tissues are intact. IMPRESSION: Negative. Electronically Signed   By: Rolm Baptise M.D.   On: 10/22/2021 22:22  ? ?CT Head Wo Contrast ? ?Result Date: 10/22/2021 ?CLINICAL DATA:  Fall.  Head trauma, minor (Age >= 65y) EXAM: CT HEAD WITHOUT CONTRAST TECHNIQUE: Contiguous axial images were obtained from the base of the skull through the vertex without intravenous contrast. RADIATION DOSE REDUCTION: This exam was performed according to the departmental dose-optimization program which includes automated exposure control, adjustment of the mA and/or kV according to patient size and/or use of iterative reconstruction technique. COMPARISON:  None. FINDINGS: Brain: There is atrophy and chronic small vessel disease changes. No acute intracranial abnormality. Specifically, no hemorrhage, hydrocephalus, mass lesion,  acute infarction, or significant intracranial injury. Vascular: No hyperdense vessel or unexpected calcification. Skull: No acute calvarial abnormality. Sinuses/Orbits: No acute findings. Postoperative changes in the left paranasal sinuses. Other: None IMPRESSION: Atrophy, chronic microvascular disease. No acute intracranial abnormality. Electronically Signed   By: Rolm Baptise M.D.   On: 10/22/2021 22:09  ? ?CT Cervical Spine Wo Contrast ? ?Result Date: 10/22/2021 ?CLINICAL DATA:  Neck trauma (Age >= 65y).  Fall. EXAM: CT CERVICAL SPINE WITHOUT CONTRAST TECHNIQUE: Multidetector CT imaging of the cervical spine was performed without intravenous contrast. Multiplanar CT image reconstructions were also generated. RADIATION DOSE REDUCTION: This exam was performed according to the departmental dose-optimization program which includes automated exposure control, adjustment of the mA and/or kV according to patient size and/or use of iterative reconstruction technique. COMPARISON:  None. FINDINGS: Alignment: Normal Skull base  and vertebrae: No acute fracture. No primary bone lesion or focal pathologic process. Soft tissues and spinal canal: No prevertebral fluid or swelling. No visible canal hematoma. Disc levels:  Diffuse degenerative disc disease and facet disease. Upper chest: Biapical scarring. Other: No acute findings IMPRESSION: No acute bony abnormality. Electronically Signed   By: Rolm Baptise M.D.   On: 10/22/2021 22:11  ? ?DG Chest Portable 1 View ? ?Result Date: 10/22/2021 ?CLINICAL DATA:  Fall EXAM: PORTABLE CHEST 1 VIEW COMPARISON:  None. FINDINGS: Heart and mediastinal contours are within normal limits. No focal opacities or effusions. No acute bony abnormality. Aortic atherosclerosis. IMPRESSION: No active cardiopulmonary disease. Electronically Signed   By: Rolm Baptise M.D.   On: 10/22/2021 22:20  ? ?DG Knee Left Port ? ?Result Date: 10/23/2021 ?CLINICAL DATA:  88 year old male with history of displaced  fracture of the left femoral neck. EXAM: PORTABLE LEFT KNEE - 1-2 VIEW COMPARISON:  No priors. FINDINGS: Two views of the left knee demonstrate no acute displaced fracture, subluxation or dislocation. There is

## 2021-10-23 NOTE — Plan of Care (Signed)

## 2021-10-23 NOTE — Anesthesia Procedure Notes (Addendum)
Procedure Name: Intubation ?Date/Time: 10/23/2021 11:43 AM ?Performed by: Inda Coke, CRNA ?Pre-anesthesia Checklist: Patient identified, Emergency Drugs available, Suction available and Patient being monitored ?Patient Re-evaluated:Patient Re-evaluated prior to induction ?Oxygen Delivery Method: Circle System Utilized ?Preoxygenation: Pre-oxygenation with 100% oxygen ?Induction Type: IV induction ?Ventilation: Mask ventilation without difficulty ?Laryngoscope Size: Glidescope and 3 ?Grade View: Grade I ?Tube type: Oral ?Tube size: 7.5 mm ?Number of attempts: 1 ?Airway Equipment and Method: Stylet and Oral airway ?Placement Confirmation: ETT inserted through vocal cords under direct vision, positive ETCO2 and breath sounds checked- equal and bilateral ?Secured at: 22 cm ?Tube secured with: Tape ?Dental Injury: Teeth and Oropharynx as per pre-operative assessment  ?Comments: 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 ? ? ? ?

## 2021-10-23 NOTE — Progress Notes (Signed)
Received patient from PACU via bed.  Patient is alert and oriented x 4.  Left hip surgical dressing CDI.  Oriented to room and unit rouine.  Call bell within reach.  Needs addressed.  ?

## 2021-10-23 NOTE — ED Notes (Signed)
Trauma Response Nurse Documentation ? ? ?Craig Snow is a 88 y.o. male arriving to [facility] ED via EMS ? ?On warfarin daily. Trauma was activated as a Level 2 by ED charge RN based on the following trauma criteria Elderly patients > 65 with head trauma on anti-coagulation (excluding ASA). TRN to bedside shortly after patient's arrival. Patient cleared for CT by Dr. Regenia Skeeter. Patient to CT with team. GCS 15. ? ?History  ? Past Medical History:  ?Diagnosis Date  ? Acute lower UTI 07/30/2021  ? Atrial fibrillation ([place])   ? Candidiasis of penis 07/30/2021  ? Community acquired pneumonia 07/30/2021  ? Hyperlipidemia   ? Hypertension   ? Supratherapeutic INR 07/30/2021  ? Thyrotoxicosis   ?  ? No past surgical history on file.  ? ? ? ?Initial Focused Assessment (If applicable, or please see trauma documentation): ?Alert male presents via EMS after a mechanical ground level fall while ambulating with walker ?Airway patent, BS clear, respirations easy and unlabored ?No obvious uncontrolled hemorrhage ?Left leg shortened and rotated ?Avulsion to left elbow ? ?CT's Completed:   ?CT Head and CT C-Spine  ? ?Interventions:  ?CTs as above ?XRAYS chest, pelvis, left hip, left elbow ?Trauma lab draw ?Wound care, laceration repair ? ?Plan for disposition:  ?Admission to floor  ? ?Consults completed:  ?Orthopaedic Surgeon at 2228 ?Hospitalist ? ?Event Summary: ?Patient arrives via EMS after a fall. Was walking on sidewalk with a walker and fell on left side. Coumadin pt. Left elbow avulsion, left leg shortened and externally rotated. No LOC. Son at bedside updated. Pending admission to tele unit.  ? ?MTP Summary (If applicable): NA ? ?Bedside handoff with ED RN Aryana.   ? ?[place]  ?Trauma Response RN ? ?Please call TRN at [phone] for further assistance. ?  ?

## 2021-10-23 NOTE — Anesthesia Postprocedure Evaluation (Signed)
Anesthesia Post Note ? ?Patient: Craig Snow ? ?Procedure(s) Performed: ANTERIOR APPROACH HEMI HIP ARTHROPLASTY (Left: Hip) ? ?  ? ?Patient location during evaluation: PACU ?Anesthesia Type: General ?Level of consciousness: awake ?Pain management: pain level controlled ?Vital Signs Assessment: post-procedure vital signs reviewed and stable ?Respiratory status: spontaneous breathing, nonlabored ventilation, respiratory function stable and patient connected to nasal cannula oxygen ?Cardiovascular status: blood pressure returned to baseline and stable ?Postop Assessment: no apparent nausea or vomiting ?Anesthetic complications: no ? ? ?No notable events documented. ? ?Last Vitals:  ?Vitals:  ? 10/23/21 1409 10/23/21 1431  ?BP: (!) 109/49 (!) 93/51  ?Pulse: (!) 57 (!) 55  ?Resp: 15 15  ?Temp: (!) 36.3 ?C 36.4 ?C  ?SpO2: 94% 100%  ?  ?Last Pain:  ?Vitals:  ? 10/23/21 1537  ?TempSrc:   ?PainSc: 0-No pain  ? ? ?  ?  ?  ?  ?  ?  ? ?[name] P [name] ? ? ? ? ?

## 2021-10-23 NOTE — Progress Notes (Signed)
?PROGRESS NOTE ? ?Craig Snow  WJX:[id number] DOB: [birth year] DOA: 10/22/2021 ?PCP: Pcp, No  ? ?Brief Narrative: ? ?Patient is a 88 year old male with history of paroxysmal A-fib, bladder cancer, hypothyroidism, hyperlipidemia, hypertension, frequent falls, previous history of right hip hemiarthroplasty who was recently discharged from skilled nursing facility to home presented after a fall while he was walking with a walker followed by left hip pain.  On presentation, he was hemodynamically stable.  CT head did not show any acute abnormality as well as CT cervical spine.  Left hip x-ray showed left femur fracture with varus angulation.  Plan for left hip hemiarthroplasty by orthopedics. ? ?Assessment & Plan: ? ?Principal Problem: ?  Femoral neck fracture ([place]) ?Active Problems: ?  Atrial fibrillation ([place]) ?  History of bladder cancer ?  HLD (hyperlipidemia) ?  HTN (hypertension) ?  Hypothyroidism ? ?Left femoral neck fracture: He fell while  walking with his walker.  Fell on the left side followed by left hip pain.  Imaging showed left femur fracture with varus angulation. ?He has history of right hip hemiarthroplasty. ?Orthopedics following, plan for left hip arthroplasty. ?Continue pain management, supportive care.  PT/OT after surgery ? ?Paroxysmal A-fib: On rate control with metoprolol.  On warfarin for anticoagulation.  Given vitamin K.  Monitor INR, warfarin will be resumed after surgery ? ?Hypothyroidism: Continue Synthyroid, levothyroxine ? ?Hypertension: On metoprolol and Lasix.  Monitor blood pressure ? ?Hyperlipidemia: On Lipitor ? ?History of bladder cancer: On home Myrbetriq, tamsulosin ? ?Leukocytosis: Mild.  Most likely reactive ? ?Macrocytic anemia: MCV of 113 with hemoglobin in the range of 10.  Check vitamin B 12 and folic acid ? ? ? ? ?  ?  ? ?DVT prophylaxis:SCDs Start: 10/22/21 2304 ? ? ?  Code Status: DNR ? ?Family Communication: Son at bedside ? ?Patient status:Inpatient ? ?Patient is from  :Home ? ?Anticipated discharge to:SNF ? ?Estimated DC date:2-3 days ? ? ?Consultants:Ortho  ? ?Procedures: ? ?Antimicrobials:  ?Anti-infectives (From admission, onward)  ? ? None  ? ?  ? ? ?Subjective: ?Patient seen and examined at the bedside this morning.  Hemodynamically stable during my evaluation.  Not in any kind of distress.  Son at the bedside.  Pain well controlled ? ?Objective: ?Vitals:  ? 10/23/21 0617 10/23/21 0630 10/23/21 0645 10/23/21 0700  ?BP: (!) 101/51 (!) 107/54 (!) 106/56 119/66  ?Pulse: 75 72 75 74  ?Resp: '13 15 12 17  '$ ?Temp:      ?TempSrc:      ?SpO2: 96% 97% 98% 97%  ? ?No intake or output data in the 24 hours ending 10/23/21 0811 ?There were no vitals filed for this visit. ? ?Examination: ? ?General exam: Overall comfortable, not in distress, pleasant elderly male ?HEENT: PERRL ?Respiratory system:  no wheezes or crackles  ?Cardiovascular system: S1 & S2 heard, RRR.  ?Gastrointestinal system: Abdomen is nondistended, soft and nontender. ?Central nervous system: Alert and oriented ?Extremities: No edema, no clubbing ,no cyanosis, tenderness on the left hip, left lower extremity rotated externally ?Skin: No rashes, no ulcers,no icterus   ? ? ?Data Reviewed: I have personally reviewed following labs and imaging studies ? ?CBC: ?Recent Labs  ?Lab 10/22/21 ?2147 10/23/21 ?2130  ?WBC 11.2* 12.2*  ?NEUTROABS 9.1*  --   ?HGB 10.6* 10.3*  ?HCT 32.2* 31.6*  ?MCV 113.4* 113.7*  ?PLT 163 156  ? ?Basic Metabolic Panel: ?Recent Labs  ?Lab 10/22/21 ?2147 10/23/21 ?8657  ?NA 136 135  ?K 4.8 4.5  ?CL  103 104  ?CO2 25 24  ?GLUCOSE 197* 140*  ?BUN 17 14  ?CREATININE 1.01 0.86  ?CALCIUM 9.3 9.1  ? ? ? ?No results found for this or any previous visit (from the past 240 hour(s)).  ? ?Radiology Studies: ?DG Elbow Complete Left ? ?Result Date: 10/22/2021 ?CLINICAL DATA:  Fall EXAM: LEFT ELBOW - COMPLETE 3+ VIEW COMPARISON:  None. FINDINGS: No acute bony abnormality. Specifically, no fracture, subluxation, or  dislocation. No joint effusion. Soft tissues are intact. IMPRESSION: Negative. Electronically Signed   By: Rolm Baptise M.D.   On: 10/22/2021 22:22  ? ?CT Head Wo Contrast ? ?Result Date: 10/22/2021 ?CLINICAL DATA:  Fall.  Head trauma, minor (Age >= 65y) EXAM: CT HEAD WITHOUT CONTRAST TECHNIQUE: Contiguous axial images were obtained from the base of the skull through the vertex without intravenous contrast. RADIATION DOSE REDUCTION: This exam was performed according to the departmental dose-optimization program which includes automated exposure control, adjustment of the mA and/or kV according to patient size and/or use of iterative reconstruction technique. COMPARISON:  None. FINDINGS: Brain: There is atrophy and chronic small vessel disease changes. No acute intracranial abnormality. Specifically, no hemorrhage, hydrocephalus, mass lesion, acute infarction, or significant intracranial injury. Vascular: No hyperdense vessel or unexpected calcification. Skull: No acute calvarial abnormality. Sinuses/Orbits: No acute findings. Postoperative changes in the left paranasal sinuses. Other: None IMPRESSION: Atrophy, chronic microvascular disease. No acute intracranial abnormality. Electronically Signed   By: Rolm Baptise M.D.   On: 10/22/2021 22:09  ? ?CT Cervical Spine Wo Contrast ? ?Result Date: 10/22/2021 ?CLINICAL DATA:  Neck trauma (Age >= 65y).  Fall. EXAM: CT CERVICAL SPINE WITHOUT CONTRAST TECHNIQUE: Multidetector CT imaging of the cervical spine was performed without intravenous contrast. Multiplanar CT image reconstructions were also generated. RADIATION DOSE REDUCTION: This exam was performed according to the departmental dose-optimization program which includes automated exposure control, adjustment of the mA and/or kV according to patient size and/or use of iterative reconstruction technique. COMPARISON:  None. FINDINGS: Alignment: Normal Skull base and vertebrae: No acute fracture. No primary bone lesion or  focal pathologic process. Soft tissues and spinal canal: No prevertebral fluid or swelling. No visible canal hematoma. Disc levels:  Diffuse degenerative disc disease and facet disease. Upper chest: Biapical scarring. Other: No acute findings IMPRESSION: No acute bony abnormality. Electronically Signed   By: Rolm Baptise M.D.   On: 10/22/2021 22:11  ? ?DG Chest Portable 1 View ? ?Result Date: 10/22/2021 ?CLINICAL DATA:  Fall EXAM: PORTABLE CHEST 1 VIEW COMPARISON:  None. FINDINGS: Heart and mediastinal contours are within normal limits. No focal opacities or effusions. No acute bony abnormality. Aortic atherosclerosis. IMPRESSION: No active cardiopulmonary disease. Electronically Signed   By: Rolm Baptise M.D.   On: 10/22/2021 22:20  ? ?DG Hip Unilat W or Wo Pelvis 2-3 Views Left ? ?Result Date: 10/22/2021 ?CLINICAL DATA:  Fall, hip pain EXAM: DG HIP (WITH OR WITHOUT PELVIS) 2-3V LEFT COMPARISON:  None. FINDINGS: Prior right hip replacement. There is a left femoral neck fracture with varus angulation. No subluxation or dislocation. No additional acute bony abnormality. Vascular calcifications noted. IMPRESSION: Left femoral neck fracture with varus angulation. Electronically Signed   By: Rolm Baptise M.D.   On: 10/22/2021 22:21   ? ?Scheduled Meds: ? atorvastatin  10 mg Oral QHS  ? furosemide  20 mg Oral q morning  ? levothyroxine  50 mcg Oral q morning  ? liothyronine  5 mcg Oral q morning  ? metoprolol tartrate  25 mg Oral BID  ? mirabegron ER  25 mg Oral Daily  ? pantoprazole  40 mg Oral q morning  ? tamsulosin  0.4 mg Oral q morning  ? ?Continuous Infusions: ? phytonadione (VITAMIN K) IV    ? ? ? LOS: 1 day  ? ?Shelly Coss, MD ?[facility] ?P4/22/2023, 8:11 AM   ?

## 2021-10-23 NOTE — Discharge Instructions (Signed)
? ?Dr. Aaron Edelman Lameshia Hypolite ?Joint Replacement Specialist ?[facility] [address] ?[place],  [address] ?[phone] ? ? ?HIP REPLACEMENT POSTOPERATIVE DIRECTIONS ? ? ? ?Hip Rehabilitation, Guidelines Following Surgery  ? ?WEIGHT BEARING ?Weight bearing as tolerated with assist device (walker, cane, etc) as directed, use it as long as suggested by your surgeon or therapist, typically at least 4-6 weeks. ? ?The results of a hip operation are greatly improved after range of motion and muscle strengthening exercises. Follow all safety measures which are given to protect your hip. If any of these exercises cause increased pain or swelling in your joint, decrease the amount until you are comfortable again. Then slowly increase the exercises. Call your caregiver if you have problems or questions.  ? ?HOME CARE INSTRUCTIONS  ?Most of the following instructions are designed to prevent the dislocation of your new hip.  ?Remove items at home which could result in a fall. This includes throw rugs or furniture in walking pathways.  ?Continue medications as instructed at time of discharge. ?You may have some home medications which will be placed on hold until you complete the course of blood thinner medication. ?You may start showering once you are discharged home. Do not remove your dressing. ?Do not put on socks or shoes without following the instructions of your caregivers.   ?Sit on chairs with arms. Use the chair arms to help push yourself up when arising.  ?Arrange for the use of a toilet seat elevator so you are not sitting low.  ?Walk with walker as instructed.  ?You may resume a sexual relationship in one month or when given the OK by your caregiver.  ?Use walker as long as suggested by your caregivers.  ?You may put full weight on your legs and walk as much as is comfortable. ?Avoid periods of inactivity such as sitting longer than an hour when not asleep. This helps prevent blood clots.   ?You may return to work once you are cleared by [employer], production.  ?Do not drive a car for 6 weeks or until released by your surgeon.  ?Do not drive while taking narcotics.  ?Wear elastic stockings for two weeks following surgery during the day but you may remove then at night.  ?Make sure you keep all of your appointments after your operation with all of your doctors and caregivers. You should call the office at the above phone number and make an appointment for approximately two weeks after the date of your surgery. ?Please pick up a stool softener and laxative for home use as long as you are requiring pain medications. ?ICE to the affected hip every three hours for 30 minutes at a time and then as needed for pain and swelling. Continue to use ice on the hip for pain and swelling from surgery. You may notice swelling that will progress down to the foot and ankle.  This is normal after surgery.  Elevate the leg when you are not up walking on it.   ?It is important for you to complete the blood thinner medication as prescribed by your doctor. ?Continue to use the breathing machine which will help keep your temperature down.  It is common for your temperature to cycle up and down following surgery, especially at night when you are not up moving around and exerting yourself.  The breathing machine keeps your lungs expanded and your temperature down. ? ?RANGE OF MOTION AND STRENGTHENING EXERCISES  ?These exercises are designed to help you keep  full movement of your hip joint. Follow your caregiver's or physical therapist's instructions. Perform all exercises about fifteen times, three times per day or as directed. Exercise both hips, even if you have had only one joint replacement. These exercises can be done on a training (exercise) mat, on the floor, on a table or on a bed. Use whatever works the best and is most comfortable for you. Use music or television while you are exercising so that the exercises are a pleasant  break in your day. This will make your life better with the exercises acting as a break in routine you can look forward to.  ?Lying on your back, slowly slide your foot toward your buttocks, raising your knee up off the floor. Then slowly slide your foot back down until your leg is straight again.  ?Lying on your back spread your legs as far apart as you can without causing discomfort.  ?Lying on your side, raise your upper leg and foot straight up from the floor as far as is comfortable. Slowly lower the leg and repeat.  ?Lying on your back, tighten up the muscle in the front of your thigh (quadriceps muscles). You can do this by keeping your leg straight and trying to raise your heel off the floor. This helps strengthen the largest muscle supporting your knee.  ?Lying on your back, tighten up the muscles of your buttocks both with the legs straight and with the knee bent at a comfortable angle while keeping your heel on the floor.  ? ?SKILLED REHAB INSTRUCTIONS: ?If the patient is transferred to a skilled rehab facility following release from the hospital, a list of the current medications will be sent to the facility for the patient to continue.  When discharged from the skilled rehab facility, please have the facility set up the patient's [place] prior to being released. Also, the skilled facility will be responsible for providing the patient with their medications at time of release from the facility to include their pain medication and their blood thinner medication. If the patient is still at the rehab facility at time of the two week follow up appointment, the skilled rehab facility will also need to assist the patient in arranging follow up appointment in our office and any transportation needs. ? ?POST-OPERATIVE OPIOID TAPER INSTRUCTIONS: ?It is important to wean off of your opioid medication as soon as possible. If you do not need pain medication after your surgery it is ok to stop  day one. ?Opioids include: ?Codeine, Hydrocodone(Norco, Vicodin), Oxycodone(Percocet, oxycontin) and hydromorphone amongst others.  ?Long term and even short term use of opiods can cause: ?Increased pain response ?Dependence ?Constipation ?Depression ?Respiratory depression ?And more.  ?Withdrawal symptoms can include ?Flu like symptoms ?Nausea, vomiting ?And more ?Techniques to manage these symptoms ?Hydrate well ?Eat regular healthy meals ?Stay active ?Use relaxation techniques(deep breathing, meditating, yoga) ?Do Not substitute Alcohol to help with tapering ?If you have been on opioids for less than two weeks and do not have pain than it is ok to stop all together.  ?Plan to wean off of opioids ?This plan should start within one week post op of your joint replacement. ?Maintain the same interval or time between taking each dose and first decrease the dose.  ?Cut the total daily intake of opioids by one tablet each day ?Next start to increase the time between doses. ?The last dose that should be eliminated is the evening dose.  ? ? ?MAKE SURE  YOU:  ?Understand these instructions.  ?Will watch your condition.  ?Will get help right away if you are not doing well or get worse. ? ?Pick up stool softner and laxative for home use following surgery while on pain medications. ?Do not remove your dressing. No showers or tub baths. ?Drain care: every 4 hours, empty JP drain, recharge the drain, and record the output in a log. Please bring log to follow up appointment. ?Continue to use ice for pain and swelling after surgery. ?Do not use any lotions or creams on the incision until instructed by your surgeon. ?Call [phone] ASAP to schedule a follow up appointment in 2 weeks. ?Total Hip Protocol. ? ?

## 2021-10-23 NOTE — Progress Notes (Signed)
Patient presented to the ER last night after a ground-level fall at home.  He was recently discharged from a rehab facility.  He ambulates with a walker.  The walker apparently came out from under him, and he fell onto his left side.  He had hip pain and inability to weight-bear.  He was brought to the emergency department at [facility], where x-rays revealed a displaced left femoral neck fracture.  He does have history of previous right hip hemiarthroplasty as well.  He has a history of A-fib and takes Coumadin.  His INR is 1.8 today.  I was asked to take over care by Dr. Doreatha Martin, due to my expertise in adult reconstruction.  Patient needs a left hip hemiarthroplasty for pain control and immediate mobilization out of bed.  I have ordered IV vitamin K.  Hold chemical DVT prophylaxis.  Continue NPO.  Plan for surgery today.  Full consult to follow. ?

## 2021-10-23 NOTE — Progress Notes (Addendum)
ANTICOAGULATION CONSULT NOTE - Initial Consult ? ?Pharmacy Consult for Warfarin  ?Indication: atrial fibrillation ? ?Allergies  ?Allergen Reactions  ? Aspirin Other (See Comments)  ?  Unknown reaction - listed on [facility] 07/30/21  ? Clindamycin/Lincomycin Other (See Comments)  ?  Unknown reaction - listed on [facility] 07/30/21  ? Other Other (See Comments)  ?  Unknown reaction to opioids - listed on [facility] 07/30/21  ? Sulfa Antibiotics Other (See Comments)  ?  Unknown reaction - listed on [facility] 07/30/21  ? ? ?Patient Measurements: ?Height: '5\' 11"'$  (180.3 cm) ?Weight: 65.8 kg (145 lb 1 oz) ?IBW/kg (Calculated) : 75.3 ?Heparin Dosing Weight: 65.8 kg  ? ?Vital Signs: ?Temp: 97.6 ?F (36.4 ?C) (04/22 1431) ?Temp Source: Oral (04/22 1431) ?BP: 93/51 (04/22 1431) ?Pulse Rate: 55 (04/22 1431) ? ?Labs: ?Recent Labs  ?  10/22/21 ?2147 10/23/21 ?8657  ?HGB 10.6* 10.3*  ?HCT 32.2* 31.6*  ?PLT 163 156  ?LABPROT 20.4*  --   ?INR 1.8*  --   ?CREATININE 1.01 0.86  ? ? ?Estimated Creatinine Clearance: 44.6 mL/min (by C-G formula based on SCr of 0.86 mg/dL). ? ? ?Medical History: ?Past Medical History:  ?Diagnosis Date  ? Acute lower UTI 07/30/2021  ? Atrial fibrillation ([place])   ? Candidiasis of penis 07/30/2021  ? Community acquired pneumonia 07/30/2021  ? Hyperlipidemia   ? Hypertension   ? Neoplasm of palate 2008  ? Supratherapeutic INR 07/30/2021  ? Thyrotoxicosis   ? ? ?Assessment: ?28 YOM presenting with fall resulting in hip fracture s/p repair 4/22 (EBL -229m). Patient is on warfarin PTA for atrial fibrillation. He received 10 mg IV Vitamin K 4/22 AM. Pharmacy has been consulted for warfarin dosing.  ? ?Will plan to dose first dose slightly higher than PTA regimen as it will likely be antagonized by VIT K administration 4/22 AM. Hgb stable in 10s.  ? ?PTA regimen: 2 mg Sun/Tues, 3 mg Mon/Wed/Thurs/Fri/Sat  ? ?Goal of Therapy:  ?INR 2-3 ?Monitor platelets by anticoagulation protocol: Yes ?  ?Plan:  ?Warfarin 4 mg PO x1  ?Daily PT/INR, CBC  ?Per  ortho, plan to start lovenox bridge on 4/23 AM  ? ?AAdria Dill PharmD ?PGY-1 Acute Care Resident  ?10/23/2021 2:55 PM  ? ? ? ?

## 2021-10-24 ENCOUNTER — Other Ambulatory Visit: Payer: Self-pay

## 2021-10-24 DIAGNOSIS — S72002A Fracture of unspecified part of neck of left femur, initial encounter for closed fracture: Secondary | ICD-10-CM | POA: Diagnosis not present

## 2021-10-24 LAB — CBC
HCT: 25.3 % — ABNORMAL LOW (ref 39.0–52.0)
Hemoglobin: 8 g/dL — ABNORMAL LOW (ref 13.0–17.0)
MCH: 36 pg — ABNORMAL HIGH (ref 26.0–34.0)
MCHC: 31.6 g/dL (ref 30.0–36.0)
MCV: 114 fL — ABNORMAL HIGH (ref 80.0–100.0)
Platelets: 129 10*3/uL — ABNORMAL LOW (ref 150–400)
RBC: 2.22 MIL/uL — ABNORMAL LOW (ref 4.22–5.81)
RDW: 13.2 % (ref 11.5–15.5)
WBC: 12.7 10*3/uL — ABNORMAL HIGH (ref 4.0–10.5)
nRBC: 0 % (ref 0.0–0.2)

## 2021-10-24 LAB — PROTIME-INR
INR: 1.7 — ABNORMAL HIGH (ref 0.8–1.2)
Prothrombin Time: 20.2 seconds — ABNORMAL HIGH (ref 11.4–15.2)

## 2021-10-24 MED ORDER — CHLORHEXIDINE GLUCONATE CLOTH 2 % EX PADS
6.0000 | MEDICATED_PAD | Freq: Every day | CUTANEOUS | Status: DC
  Administered 2021-10-24 – 2021-10-25 (×2): 6 via TOPICAL

## 2021-10-24 MED ORDER — HALOPERIDOL LACTATE 5 MG/ML IJ SOLN: 2.0000 mg | Freq: Four times a day (QID) | INTRAMUSCULAR | Status: DC | PRN

## 2021-10-24 MED ORDER — WARFARIN SODIUM 4 MG PO TABS
4.0000 mg | ORAL_TABLET | Freq: Once | ORAL | Status: AC
  Administered 2021-10-24: 4 mg via ORAL
  Filled 2021-10-24: qty 1

## 2021-10-24 MED ORDER — VITAMIN B-12 1000 MCG PO TABS
500.0000 ug | ORAL_TABLET | Freq: Every day | ORAL | Status: DC
  Administered 2021-10-24 – 2021-10-27 (×4): 500 ug via ORAL
  Filled 2021-10-24 (×4): qty 1

## 2021-10-24 MED ORDER — QUETIAPINE FUMARATE 25 MG PO TABS
25.0000 mg | ORAL_TABLET | Freq: Every day | ORAL | Status: DC
  Administered 2021-10-24 – 2021-10-26 (×3): 25 mg via ORAL
  Filled 2021-10-24 (×3): qty 1

## 2021-10-24 NOTE — Plan of Care (Signed)
  Problem: Clinical Measurements: Goal: Will remain free from infection Outcome: Progressing Goal: Respiratory complications will improve Outcome: Progressing Goal: Cardiovascular complication will be avoided Outcome: Progressing   Problem: Activity: Goal: Risk for activity intolerance will decrease Outcome: Progressing   

## 2021-10-24 NOTE — Evaluation (Signed)
Occupational Therapy Evaluation ?Patient Details ?Name: Craig Snow ?MRN: [id number] ?DOB: Mar 16, 1921 ?Today's Date: 10/24/2021 ? ? ?History of Present Illness Pt is a 88 y.o. male with history of right hip hemiarthroplasty who was recently discharged from skilled nursing facility to home presented 4/21 after a fall; s/p left hip hemiarthroplasty by orthopedics on 4/22. PMH significant for bladder cancer and chronic urinary retention, a-fib, HTN, and HLD,  ? ?Clinical Impression ?  ?PTA pt using Rollator for mobility, recently d/c'd from SNF to son's home. Pt currently min-mod A for ADLs, minA +2 for bed mobility and transfers with RW. Pt able to walk short hallway distance with min fatigue. Pt benefitting from increased cuing for safety due to HOH/decreased cognition, demonstrates mild impulsivity during session. Pt presenting with impairments listed below, will follow acutely. Recommend SNF at d/c. ?   ? ?Recommendations for follow up therapy are one component of a multi-disciplinary discharge planning process, led by the attending physician.  Recommendations may be updated based on patient status, additional functional criteria and insurance authorization.  ? ?Follow Up Recommendations ? Skilled nursing-short term rehab (<3 hours/day)  ?  ?Assistance Recommended at Discharge Intermittent Supervision/Assistance  ?Patient can return home with the following A lot of help with walking and/or transfers;A lot of help with bathing/dressing/bathroom;Assistance with cooking/housework;Assist for transportation;Help with stairs or ramp for entrance ? ?  ?Functional Status Assessment ? Patient has had a recent decline in their functional status and demonstrates the ability to make significant improvements in function in a reasonable and predictable amount of time.  ?Equipment Recommendations ? None recommended by OT;Other (comment) (defer to next venue of care)  ?  ?Recommendations for Other Services PT consult ? ? ?   ?Precautions / Restrictions Precautions ?Precautions: Fall ?Precaution Comments: JP drain Lt hip ?Restrictions ?Weight Bearing Restrictions: Yes ?LLE Weight Bearing: Weight bearing as tolerated  ? ?  ? ?Mobility Bed Mobility ?Overal bed mobility: Needs Assistance ?Bed Mobility: Supine to Sit ?  ?  ?Supine to sit: Min assist, +2 for safety/equipment ?  ?  ?General bed mobility comments: impulsivity, requiring verbal and visual cues to remain seated vs stand up from EOB ?  ? ?Transfers ?Overall transfer level: Needs assistance ?Equipment used: Rolling walker (2 wheels) ?Transfers: Sit to/from Stand ?Sit to Stand: Min assist, +2 safety/equipment ?  ?  ?  ?  ?  ?  ?  ? ?  ?Balance Overall balance assessment: Needs assistance ?Sitting-balance support: No upper extremity supported, Feet supported ?Sitting balance-Leahy Scale: Fair ?  ?  ?Standing balance support: Bilateral upper extremity supported ?Standing balance-Leahy Scale: Poor ?  ?  ?  ?  ?  ?  ?  ?  ?  ?  ?  ?  ?   ? ?ADL either performed or assessed with clinical judgement  ? ?ADL Overall ADL's : Needs assistance/impaired ?Eating/Feeding: Set up;Sitting ?  ?Grooming: Set up;Sitting ?  ?Upper Body Bathing: Minimal assistance;Sitting ?  ?Lower Body Bathing: Moderate assistance;Sit to/from stand;Sitting/lateral leans;Maximal assistance ?  ?Upper Body Dressing : Minimal assistance;Standing;Sitting ?  ?Lower Body Dressing: Moderate assistance;Maximal assistance;Sitting/lateral leans;Sit to/from stand ?  ?Toilet Transfer: Minimal assistance;Rolling walker (2 wheels);Ambulation;Regular Toilet;+2 for physical assistance ?Toilet Transfer Details (indicate cue type and reason): simulated to chair ?Toileting- Clothing Manipulation and Hygiene: Moderate assistance;Sitting/lateral lean;Sit to/from stand ?  ?  ?  ?Functional mobility during ADLs: Rolling walker (2 wheels);Minimal assistance;+2 for physical assistance ?   ? ? ? ?Vision   ?Vision Assessment?:  No apparent visual  deficits  ?   ?Perception   ?  ?Praxis   ?  ? ?Pertinent Vitals/Pain Pain Assessment ?Pain Assessment: Faces ?Pain Score: 4  ?Faces Pain Scale: Hurts little more ?Pain Location: Left hip ?Pain Descriptors / Indicators: Aching, Guarding ?Pain Intervention(s): Limited activity within patient's tolerance, Monitored during session, Repositioned  ? ? ? ?Hand Dominance   ?  ?Extremity/Trunk Assessment Upper Extremity Assessment ?Upper Extremity Assessment: Generalized weakness ?  ?Lower Extremity Assessment ?Lower Extremity Assessment: Defer to PT evaluation ?LLE Deficits / Details: JP drain in place, pain limited, but otherwise moving RLE very well during functional assessment. ?LLE: Unable to fully assess due to pain ?  ?Cervical / Trunk Assessment ?Cervical / Trunk Assessment: Kyphotic ?  ?Communication Communication ?Communication: HOH;Expressive difficulties;Other (comment) (Due to soft palate orthosis (2/2 radiation therapy)) ?  ?Cognition Arousal/Alertness: Awake/alert ?Behavior During Therapy: Impulsive ?Overall Cognitive Status: Impaired/Different from baseline ?Area of Impairment: Following commands, Safety/judgement, Problem solving ?  ?  ?  ?  ?  ?  ?  ?  ?  ?  ?  ?Following Commands: Follows one step commands inconsistently, Follows one step commands with increased time ?Safety/Judgement: Decreased awareness of safety, Decreased awareness of deficits ?  ?Problem Solving: Requires verbal cues, Requires tactile cues ?General Comments: Son reports delirium onset yesterday but seeming clearing up today. 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 Reports pt benefits from detailed/further explanation. ?  ?  ?General Comments  son present during session, provides PLOF for pt ? ?  ?Exercises   ?  ?Shoulder Instructions    ? ? ?Home Living Family/patient expects to be discharged to:: Skilled nursing  facility ?Living Arrangements: Children ?Available Help at Discharge: Family;Available PRN/intermittently ?Type of Home: House ?  ?  ?  ?  ?  ?  ?  ?  ?  ?  ?  ?Home Equipment: Rollator (4 wheels) ?  ?Additional Comments: Pt moved in with son from SNF a day prior to fall, had apparently been in SNF for a couple of months prior to this. ?  ? ?  ?Prior Functioning/Environment Prior Level of Function : Needs assist ?  ?  ?  ?Physical Assist : ADLs (physical) ?  ?  ?Mobility Comments: Walking with rollator in home independently. With son outside "about 100 yards". Pt was not supposed to walk outside alone and this is when fall occurred. ?  ?  ? ?  ?  ?OT Problem List: Decreased strength;Decreased range of motion;Decreased activity tolerance;Impaired balance (sitting and/or standing);Decreased cognition;Decreased safety awareness;Decreased knowledge of use of DME or AE ?  ?   ?OT Treatment/Interventions: Self-care/ADL training;Therapeutic exercise;DME and/or AE instruction;Energy conservation;Therapeutic activities;Patient/family education;Balance training  ?  ?OT Goals(Current goals can be found in the care plan section) Acute Rehab OT Goals ?Patient Stated Goal: none stated ?OT Goal Formulation: With patient ?Time For Goal Achievement: 11/07/21 ?Potential to Achieve Goals: Good ?ADL Goals ?Pt Will Perform Upper Body Dressing: with min assist;sitting ?Pt Will Perform Lower Body Dressing: with mod assist;sit to/from stand;sitting/lateral leans ?Pt Will Transfer to Toilet: with min assist;regular height toilet;ambulating ?Pt Will Perform Tub/Shower Transfer: Tub transfer;Shower transfer;with mod assist;shower seat;ambulating;rolling walker  ?OT Frequency: Min 2X/week ?  ? ?Co-evaluation PT/OT/SLP Co-Evaluation/Treatment: Yes ?Reason for Co-Treatment: Complexity of the patient's impairments (multi-system involvement);To address functional/ADL transfers;For patient/therapist safety ?PT goals addressed during session:  Mobility/safety with mobility;Balance;Proper use of DME;Strengthening/ROM ?OT goals addressed during session: ADL's and self-care ?  ? ?  ?AM-PAC OT "6 Clicks" Daily Activity     ?Outcome Measure Help from another person

## 2021-10-24 NOTE — Progress Notes (Signed)
Patient ID: Craig Snow, male   DOB: 11-Jan-1921, 88 y.o.   MRN: [id number] ?Subjective: ?1 Day Post-Op Procedure(s) (LRB): ?ANTERIOR APPROACH HEMI HIP ARTHROPLASTY (Left) ?  ? ?Patient very confused this am ?No reported events over night other than above noted confusion ? ?Objective:  ? ?VITALS:   ?Vitals:  ? 10/23/21 1756 10/23/21 2147  ?BP: 117/63 106/64  ?Pulse: 65 74  ?Resp: 16   ?Temp: 97.8 ?F (36.6 ?C)   ?SpO2: 97%   ? ? ?Neurovascular intact ?Incision: dressing C/D/I ?Drain in place ? ?LABS ?Recent Labs  ?  10/22/21 ?2147 10/23/21 ?5993 10/24/21 ?0158  ?HGB 10.6* 10.3* 8.0*  ?HCT 32.2* 31.6* 25.3*  ?WBC 11.2* 12.2* 12.7*  ?PLT 163 156 129*  ? ? ?Recent Labs  ?  10/22/21 ?2147 10/23/21 ?5701 10/23/21 ?1441  ?NA 136 135 135  ?K 4.8 4.5 4.1  ?BUN '17 14 10  '$ ?CREATININE 1.01 0.86 0.84  ?GLUCOSE 197* 140* 113*  ? ? ?Recent Labs  ?  10/22/21 ?2147 10/24/21 ?0158  ?INR 1.8* 1.7*  ? ? ? ?Assessment/Plan: ?1 Day Post-Op Procedure(s) (LRB): ?ANTERIOR APPROACH HEMI HIP ARTHROPLASTY (Left) ? ? ?Advance diet ?Up with therapy as tolerable related to his level of confusion ?Drain per [employer] ?

## 2021-10-24 NOTE — Progress Notes (Signed)
?PROGRESS NOTE ? ?Craig Snow  ZOX:[id number] DOB: [birth year] DOA: 10/22/2021 ?PCP: Pcp, No  ? ?Brief Narrative: ? ?Patient is a 88 year old male with history of paroxysmal A-fib, bladder cancer, hypothyroidism, hyperlipidemia, hypertension, frequent falls, previous history of right hip hemiarthroplasty who was recently discharged from skilled nursing facility to home presented after a fall while he was walking with a walker followed by left hip pain.  On presentation, he was hemodynamically stable.  CT head did not show any acute abnormality as well as CT cervical spine.  Left hip x-ray showed left femur fracture with varus angulation.Underwent left hip hemiarthroplasty by orthopedics on 4/22.  PT/OT evaluation pending.  Hospital course remarkable for delirium ? ?Assessment & Plan: ? ?Principal Problem: ?  Femoral neck fracture ([place]) ?Active Problems: ?  Atrial fibrillation ([place]) ?  History of bladder cancer ?  HLD (hyperlipidemia) ?  HTN (hypertension) ?  Hypothyroidism ? ?Left femoral neck fracture: He fell while  walking with his walker.  Fell on the left side followed by left hip pain.  Imaging showed left femur fracture with varus angulation. ?He has history of right hip hemiarthroplasty. ?Orthopedics did  left hip arthroplasty.Also a drain. ?Currently on DVT prophylaxis with both Lovenox and Coumadin.  Can DC his Lovenox after INR comes around 2 ?Continue pain management, supportive care.  PT/OT pending ? ?Paroxysmal A-fib: On rate control with metoprolol.  On warfarin for anticoagulation.  Given vitamin K before surgery.  Monitor INR, warfarin  resumed after surgery,also on Lovenox for bridging ? ?Hypothyroidism: Continue Synthyroid, liothyronine ? ?Hypertension: On metoprolol and Lasix.  Monitor blood pressure ? ?Hyperlipidemia: On Lipitor ? ?History of bladder cancer: On home Myrbetriq, tamsulosin.On chronic foley cath ? ?Leukocytosis: Mild.  Most likely reactive ? ?Acute blood loss macrocytic anemia: MCV of  114 with hemoglobin in the range of 8.  Monitor hemoglobin.  Relative deficiency of vitamin B12, started on supplementation. ? ?Delirium: Patient became acutely confused this morning.  He was agitated.  During my evaluation he was more cooperative but completely confused.  He was alert and oriented yesterday.  Continue delirium precautions, frequent reorientation.  Haldol for severe agitation.  Started on low-dose of Seroquel which can be stopped after improvement in the mental status ? ? ? ?  ?  ? ?DVT prophylaxis:enoxaparin (LOVENOX) injection 40 mg Start: 10/24/21 0800 ?SCDs Start: 10/23/21 1514 ?warfarin (COUMADIN) tablet 4 mg  ? ?  Code Status: DNR ? ?Family Communication: Son at bedside ? ?Patient status:Inpatient ? ?Patient is from :Home ? ?Anticipated discharge to:SNF ? ?Estimated DC date:2-3 days ? ? ?Consultants:Ortho  ? ?Procedures: ? ?Antimicrobials:  ?Anti-infectives (From admission, onward)  ? ? Start     Dose/Rate Route Frequency Ordered Stop  ? 10/23/21 1045  ceFAZolin (ANCEF) IVPB 2g/100 mL premix       ? 2 g ?200 mL/hr over 30 Minutes Intravenous On call to O.R. 10/23/21 1038 10/23/21 1215  ? 10/23/21 1043  ceFAZolin (ANCEF) 2-4 GM/100ML-% IVPB       ?Note to Pharmacy: [place]: cabinet override  ?    10/23/21 1043 10/23/21 1200  ? ?  ? ? ?Subjective: ?Patient seen and examined at the bedside this morning.  Hemodynamically stable.  He was confused this morning and was agitated.  During my evaluation, he was more cooperative and calm but confused.  Son at the bedside. ? ?Objective: ?Vitals:  ? 10/23/21 1756 10/23/21 2147 10/24/21 0804 10/24/21 0945  ?BP: 117/63 106/64 (!) 141/85 100/84  ?  Pulse: 65 74 83 90  ?Resp: 16   16  ?Temp: 97.8 ?F (36.6 ?C)   98 ?F (36.7 ?C)  ?TempSrc: Oral   Oral  ?SpO2: 97%   92%  ?Weight:      ?Height:      ? ? ?Intake/Output Summary (Last 24 hours) at 10/24/2021 1411 ?Last data filed at 10/24/2021 0810 ?Gross per 24 hour  ?Intake 120 ml  ?Output 20 ml  ?Net 100 ml   ? ?Filed Weights  ? 10/23/21 1053  ?Weight: 65.8 kg  ? ? ?Examination: ? ? ?General exam: Overall comfortable, not in distress, confused, elderly gentleman ?Respiratory system:  no wheezes or crackles  ?Cardiovascular system: S1 & S2 heard, RRR.  ?Gastrointestinal system: Abdomen is nondistended, soft and nontender. ?Central nervous system: Alert and oriented ?Extremities: No edema, no clubbing ,no cyanosis, surgical wound on the left hip, drain ?Skin: No rashes, no ulcers,no icterus   ? ? ?Data Reviewed: I have personally reviewed following labs and imaging studies ? ?CBC: ?Recent Labs  ?Lab 10/22/21 ?2147 10/23/21 ?3338 10/24/21 ?0158  ?WBC 11.2* 12.2* 12.7*  ?NEUTROABS 9.1*  --   --   ?HGB 10.6* 10.3* 8.0*  ?HCT 32.2* 31.6* 25.3*  ?MCV 113.4* 113.7* 114.0*  ?PLT 163 156 129*  ? ?Basic Metabolic Panel: ?Recent Labs  ?Lab 10/22/21 ?2147 10/23/21 ?3291 10/23/21 ?1441  ?NA 136 135 135  ?K 4.8 4.5 4.1  ?CL 103 104 105  ?CO2 '25 24 24  '$ ?GLUCOSE 197* 140* 113*  ?BUN '17 14 10  '$ ?CREATININE 1.01 0.86 0.84  ?CALCIUM 9.3 9.1 9.1  ? ? ? ?Recent Results (from the past 240 hour(s))  ?Surgical pcr screen     Status: Abnormal  ? Collection Time: 10/23/21 10:37 AM  ? Specimen: Nasal Mucosa; Nasal Swab  ?Result Value Ref Range Status  ? MRSA, PCR POSITIVE (A) NEGATIVE Final  ? Staphylococcus aureus POSITIVE (A) NEGATIVE Final  ?  Comment: RESULTS CALLED TO,READ BACK BY AND VERIFIED WITH RN T.ISAACS '@1402'$  ON 10/23/21 BY NM ?(NOTE) ?The Xpert SA Assay (FDA approved for NASAL specimens in patients 18 ?years of age and older), is one component of a comprehensive ?surveillance program. It is not intended to diagnose infection nor to ?guide or monitor treatment. ?Performed at [facility], [place] [address] [place], [place] ?[address] ?  ?  ? ?Radiology Studies: ?DG Elbow Complete Left ? ?Result Date: 10/22/2021 ?CLINICAL DATA:  Fall EXAM: LEFT ELBOW - COMPLETE 3+ VIEW COMPARISON:  None. FINDINGS: No acute bony abnormality.  Specifically, no fracture, subluxation, or dislocation. No joint effusion. Soft tissues are intact. IMPRESSION: Negative. Electronically Signed   By: Rolm Baptise M.D.   On: 10/22/2021 22:22  ? ?CT Head Wo Contrast ? ?Result Date: 10/22/2021 ?CLINICAL DATA:  Fall.  Head trauma, minor (Age >= 65y) EXAM: CT HEAD WITHOUT CONTRAST TECHNIQUE: Contiguous axial images were obtained from the base of the skull through the vertex without intravenous contrast. RADIATION DOSE REDUCTION: This exam was performed according to the departmental dose-optimization program which includes automated exposure control, adjustment of the mA and/or kV according to patient size and/or use of iterative reconstruction technique. COMPARISON:  None. FINDINGS: Brain: There is atrophy and chronic small vessel disease changes. No acute intracranial abnormality. Specifically, no hemorrhage, hydrocephalus, mass lesion, acute infarction, or significant intracranial injury. Vascular: No hyperdense vessel or unexpected calcification. Skull: No acute calvarial abnormality. Sinuses/Orbits: No acute findings. Postoperative changes in the left paranasal sinuses. Other: None  IMPRESSION: Atrophy, chronic microvascular disease. No acute intracranial abnormality. Electronically Signed   By: Rolm Baptise M.D.   On: 10/22/2021 22:09  ? ?CT Cervical Spine Wo Contrast ? ?Result Date: 10/22/2021 ?CLINICAL DATA:  Neck trauma (Age >= 65y).  Fall. EXAM: CT CERVICAL SPINE WITHOUT CONTRAST TECHNIQUE: Multidetector CT imaging of the cervical spine was performed without intravenous contrast. Multiplanar CT image reconstructions were also generated. RADIATION DOSE REDUCTION: This exam was performed according to the departmental dose-optimization program which includes automated exposure control, adjustment of the mA and/or kV according to patient size and/or use of iterative reconstruction technique. COMPARISON:  None. FINDINGS: Alignment: Normal Skull base and vertebrae: No  acute fracture. No primary bone lesion or focal pathologic process. Soft tissues and spinal canal: No prevertebral fluid or swelling. No visible canal hematoma. Disc levels:  Diffuse degenerative disc disease and

## 2021-10-24 NOTE — Progress Notes (Addendum)
ANTICOAGULATION CONSULT NOTE  ? ?Pharmacy Consult for Warfarin  ?Indication: atrial fibrillation ? ?Allergies  ?Allergen Reactions  ? Aspirin Other (See Comments)  ?  Unknown reaction - listed on [facility] 07/30/21  ? Clindamycin/Lincomycin Other (See Comments)  ?  Unknown reaction - listed on [facility] 07/30/21  ? Other Other (See Comments)  ?  Unknown reaction to opioids - listed on [facility] 07/30/21  ? Sulfa Antibiotics Other (See Comments)  ?  Unknown reaction - listed on [facility] 07/30/21  ? ? ?Patient Measurements: ?Height: '5\' 11"'$  (180.3 cm) ?Weight: 65.8 kg (145 lb 1 oz) ?IBW/kg (Calculated) : 75.3 ?Heparin Dosing Weight: 65.8 kg  ? ?Vital Signs: ?BP: 141/85 (04/23 0804) ?Pulse Rate: 83 (04/23 0804) ? ?Labs: ?Recent Labs  ?  10/22/21 ?2147 10/23/21 ?3491 10/23/21 ?1441 10/24/21 ?0158  ?HGB 10.6* 10.3*  --  8.0*  ?HCT 32.2* 31.6*  --  25.3*  ?PLT 163 156  --  129*  ?LABPROT 20.4*  --   --  20.2*  ?INR 1.8*  --   --  1.7*  ?CREATININE 1.01 0.86 0.84  --   ? ? ? ?Estimated Creatinine Clearance: 45.7 mL/min (by C-G formula based on SCr of 0.84 mg/dL). ? ? ?Medical History: ?Past Medical History:  ?Diagnosis Date  ? Acute lower UTI 07/30/2021  ? Atrial fibrillation ([place])   ? Candidiasis of penis 07/30/2021  ? Community acquired pneumonia 07/30/2021  ? Hyperlipidemia   ? Hypertension   ? Neoplasm of palate 2008  ? Supratherapeutic INR 07/30/2021  ? Thyrotoxicosis   ? ? ?Assessment: ?32 YOM presenting with fall resulting in hip fracture s/p repair 4/22 (EBL -283m). Patient is on warfarin PTA for atrial fibrillation. He received 10 mg IV Vitamin K 4/22 AM. Pharmacy has been consulted for warfarin dosing.  ? ?INR 1.7 s/p 1 dose of warfarin 4 mg 4/22. Will plan to dose second dose higher than PTA regimen as it will likely be antagonized by VIT K administration from 4/22 AM for a couple of days. Hgb 10.3>8 POD#1. No signs of bleeding noted per RN, possibly an element of dilution (NS @ 150 mL/h) and blood loss from procedure. Continue to  monitor.  ? ?PTA regimen: 2 mg Sun/Tues, 3 mg Mon/Wed/Thurs/Fri/Sat  ? ?Goal of Therapy:  ?INR 2-3 ?Monitor platelets by anticoagulation protocol: Yes ?  ?Plan:  ?Warfarin 4 mg PO x1  ?Daily PT/INR, CBC  ?Lovenox for DVT ppx 40 mg SQ daily until INR >/= 2  ? ?AAdria Dill PharmD ?PGY-1 Acute Care Resident  ?10/24/2021 8:45 AM  ? ? ? ?

## 2021-10-24 NOTE — Evaluation (Addendum)
Physical Therapy Evaluation ?Patient Details ?Name: Craig Snow ?MRN: [id number] ?DOB: 1921-06-15 ?Today's Date: 10/24/2021 ? ?History of Present Illness ? Pt is a 88 y.o. male with history of right hip hemiarthroplasty who was recently discharged from skilled nursing facility to home presented 4/21 after a fall; s/p left hip hemiarthroplasty by orthopedics on 4/22. PMH significant for bladder cancer and chronic urinary retention, a-fib, HTN, and HLD,  ?Clinical Impression ? Patient is s/p above surgery resulting in functional limitations due to the deficits listed below (see PT Problem List). Demonstrates ability to ambulate, today with +2 assist for safety, frequent tactile and verbal cues due to impaired cognition and hearing loss, min assist with walker control. Min assist for transfers. Pt somewhat impulsive and lacks safety awareness with multiple lines/drains in place requiring assistance to safely mobilize. Eager to work with therapies, very motivated. Son very helpful and involved in pt care. Patient will benefit from skilled PT to increase their independence and safety with mobility to allow discharge to the venue listed below.   ?   ?   ? ?Recommendations for follow up therapy are one component of a multi-disciplinary discharge planning process, led by the attending physician.  Recommendations may be updated based on patient status, additional functional criteria and insurance authorization. ? ?Follow Up Recommendations Skilled nursing-short term rehab (<3 hours/day) ? ?  ?Assistance Recommended at Discharge Frequent or constant Supervision/Assistance  ?Patient can return home with the following ? Two people to help with walking and/or transfers;Two people to help with bathing/dressing/bathroom;Assistance with cooking/housework;Direct supervision/assist for medications management;Direct supervision/assist for financial management;Assist for transportation;Help with stairs or ramp for entrance ? ?  ?Equipment  Recommendations None recommended by PT (TBD next venue of care)  ?Recommendations for Other Services ?    ?  ?Functional Status Assessment Patient has had a recent decline in their functional status and demonstrates the ability to make significant improvements in function in a reasonable and predictable amount of time.  ? ?  ?Precautions / Restrictions Precautions ?Precautions: Fall ?Precaution Comments: JP drain Lt hip ?Restrictions ?Weight Bearing Restrictions: Yes ?LLE Weight Bearing: Weight bearing as tolerated  ? ?  ? ?Mobility ? Bed Mobility ?Overal bed mobility: Needs Assistance ?Bed Mobility: Supine to Sit ?  ?  ?Supine to sit: Min assist, +2 for safety/equipment ?  ?  ?General bed mobility comments: Pt somewhat impulsive vs difficulty understanding commands attempting to get out of bed and then back into bed. Required min assist, tactile and verbal cues, with assist to manage lines/leads safely. ?  ? ?Transfers ?Overall transfer level: Needs assistance ?Equipment used: Rolling walker (2 wheels) ?Transfers: Sit to/from Stand ?Sit to Stand: Min assist, +2 safety/equipment ?  ?  ?  ?  ?  ?General transfer comment: Min assist for boost and balance to rise from EOB +2 for safety and to manage multiple lines/leads/drain/foley due to pt difficulty following commands, instability, and somewhat off balance upon standing. Cues for technique. Uncontrolled decent into chair requiring physical assist for smooth landing. ?  ? ?Ambulation/Gait ?Ambulation/Gait assistance: Min assist, +2 safety/equipment ?Gait Distance (Feet): 70 Feet ?Assistive device: Rolling walker (2 wheels) ?Gait Pattern/deviations: Step-through pattern, Decreased stride length, Decreased stance time - left, Drifts right/left, Trunk flexed ?Gait velocity: decreased ?  ?  ?General Gait Details: Educated on safe AD use with rolling walker. min assist +2 for safety, pt having difficulty navigating RW around simple objects, requires assist for placement  at times. Shows a decent step through pattern,  tolerating weight bearing through LLE well. Cues for turning, proximity to walker. ? ?Stairs ?  ?  ?  ?  ?  ? ?Wheelchair Mobility ?  ? ?Modified Rankin (Stroke Patients Only) ?  ? ?  ? ?Balance Overall balance assessment: Needs assistance ?Sitting-balance support: No upper extremity supported, Feet supported ?Sitting balance-Leahy Scale: Fair ?  ?  ?Standing balance support: Bilateral upper extremity supported ?Standing balance-Leahy Scale: Poor ?  ?  ?  ?  ?  ?  ?  ?  ?  ?  ?  ?  ?   ? ? ? ?Pertinent Vitals/Pain Pain Assessment ?Pain Assessment: Faces ?Faces Pain Scale: Hurts little more ?Pain Location: Left hip ?Pain Descriptors / Indicators: Aching, Guarding ?Pain Intervention(s): Limited activity within patient's tolerance, Monitored during session, Repositioned  ? ? ?Home Living Family/patient expects to be discharged to:: Skilled nursing facility ?Living Arrangements: Children ?Available Help at Discharge: Family;Available PRN/intermittently ?Type of Home: House ?  ?  ?  ?  ?  ?Home Equipment: Rollator (4 wheels) ?Additional Comments: Pt moved in with son from SNF a day prior to fall, had apparently been in SNF for a couple of months prior to this.  ?  ?Prior Function Prior Level of Function : Needs assist ?  ?  ?  ?Physical Assist : ADLs (physical) ?  ?  ?Mobility Comments: Walking with rollator in home independently. With son outside "about 100 yards". Pt was not supposed to walk outside alone and this is when fall occurred. ?  ?  ? ? ?Hand Dominance  ?   ? ?  ?Extremity/Trunk Assessment  ? Upper Extremity Assessment ?Upper Extremity Assessment: Defer to OT evaluation ?  ? ?Lower Extremity Assessment ?Lower Extremity Assessment: Generalized weakness;LLE deficits/detail ?LLE Deficits / Details: JP drain in place, pain limited, but otherwise moving RLE very well during functional assessment. ?LLE: Unable to fully assess due to pain ?  ? ?   ?Communication  ?  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  ?  ?  ?  ?  ?  ?  ?  ?  ?  ?  ?Following Commands: Follows one step commands inconsistently, Follows one step commands with increased time ?Safety/Judgement: Decreased awareness of safety, Decreased awareness of deficits ?  ?Problem Solving: Requires verbal cues, Requires tactile cues ?General Comments: Son reports delirium onset yesterday but seeming clearing up today. 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 ?  ?  ? ?  ?General Comments General comments (skin integrity, edema, etc.): Son present very helpful during evaluation. White board available for communication (some hearing in Rt ear only - understands lower pitch) ? ?  ?Exercises General Exercises - Lower Extremity ?Ankle Circles/Pumps: AROM, Both, 10 reps, Seated ?Quad Sets: Strengthening, Both, 5 reps ?Gluteal Sets: Strengthening, Both, 5 reps  ? ?Assessment/Plan  ?  ?PT Assessment Patient needs continued PT services  ?PT Problem List Decreased strength;Decreased range of motion;Decreased activity tolerance;Decreased balance;Decreased mobility;Decreased cognition;Decreased knowledge of use of DME;Decreased safety awareness;Pain ? ?   ?  ?PT Treatment Interventions DME instruction;Gait training;Functional mobility training;Therapeutic activities;Therapeutic exercise;Balance training;Neuromuscular re-education;Patient/family education   ? ?PT Goals (Current goals can be found in the Care Plan section)  ?Acute  Rehab PT Goals ?Patient Stated Goal: Walk more ?PT Goal Formulation: With patient ?Time For Goal Achievement:  11/07/21 ?Potential to Achieve Goals: Good ? ?  ?Frequency Min 3X/week ?  ? ? ?Co-evaluation PT/OT/SLP Co-Evaluation/Treatment: Yes ?Reason for Co-Treatment: Complexity of the patient's impairments (multi-system involveme

## 2021-10-25 ENCOUNTER — Encounter (HOSPITAL_COMMUNITY): Payer: Self-pay | Admitting: Orthopedic Surgery

## 2021-10-25 DIAGNOSIS — E785 Hyperlipidemia, unspecified: Secondary | ICD-10-CM

## 2021-10-25 DIAGNOSIS — S72002A Fracture of unspecified part of neck of left femur, initial encounter for closed fracture: Secondary | ICD-10-CM | POA: Diagnosis not present

## 2021-10-25 DIAGNOSIS — I480 Paroxysmal atrial fibrillation: Secondary | ICD-10-CM | POA: Diagnosis not present

## 2021-10-25 DIAGNOSIS — I10 Essential (primary) hypertension: Secondary | ICD-10-CM

## 2021-10-25 DIAGNOSIS — Z8551 Personal history of malignant neoplasm of bladder: Secondary | ICD-10-CM | POA: Diagnosis not present

## 2021-10-25 LAB — PROTIME-INR
INR: 1.4 — ABNORMAL HIGH (ref 0.8–1.2)
Prothrombin Time: 17 seconds — ABNORMAL HIGH (ref 11.4–15.2)

## 2021-10-25 LAB — CBC
HCT: 19.9 % — ABNORMAL LOW (ref 39.0–52.0)
Hemoglobin: 6.4 g/dL — CL (ref 13.0–17.0)
MCH: 36.6 pg — ABNORMAL HIGH (ref 26.0–34.0)
MCHC: 32.2 g/dL (ref 30.0–36.0)
MCV: 113.7 fL — ABNORMAL HIGH (ref 80.0–100.0)
Platelets: 134 10*3/uL — ABNORMAL LOW (ref 150–400)
RBC: 1.75 MIL/uL — ABNORMAL LOW (ref 4.22–5.81)
RDW: 13.4 % (ref 11.5–15.5)
WBC: 9.2 10*3/uL (ref 4.0–10.5)
nRBC: 0 % (ref 0.0–0.2)

## 2021-10-25 LAB — PREPARE RBC (CROSSMATCH)

## 2021-10-25 LAB — ABO/RH: ABO/RH(D): A POS

## 2021-10-25 MED ORDER — HYDROCODONE-ACETAMINOPHEN 7.5-325 MG PO TABS: 1.0000 | ORAL_TABLET | ORAL | Status: DC | PRN

## 2021-10-25 MED ORDER — WARFARIN SODIUM 5 MG PO TABS
5.0000 mg | ORAL_TABLET | Freq: Once | ORAL | Status: AC
  Administered 2021-10-25: 5 mg via ORAL
  Filled 2021-10-25: qty 1

## 2021-10-25 MED ORDER — ONDANSETRON HCL 4 MG/2ML IJ SOLN: 4.0000 mg | Freq: Four times a day (QID) | INTRAMUSCULAR | Status: DC | PRN

## 2021-10-25 MED ORDER — SODIUM CHLORIDE 0.9% IV SOLUTION: Freq: Once | INTRAVENOUS | Status: AC

## 2021-10-25 MED ORDER — CHLORHEXIDINE GLUCONATE CLOTH 2 % EX PADS
6.0000 | MEDICATED_PAD | Freq: Every day | CUTANEOUS | Status: DC
  Administered 2021-10-26 – 2021-10-27 (×2): 6 via TOPICAL

## 2021-10-25 MED ORDER — HYDROCODONE-ACETAMINOPHEN 5-325 MG PO TABS
1.0000 | ORAL_TABLET | ORAL | Status: DC | PRN
  Administered 2021-10-26 – 2021-10-27 (×2): 1 via ORAL
  Filled 2021-10-25 (×2): qty 1

## 2021-10-25 MED ORDER — HYDROCODONE-ACETAMINOPHEN 5-325 MG PO TABS: 1.0000 | ORAL_TABLET | ORAL | 0 refills | Status: AC | PRN

## 2021-10-25 MED ORDER — METOCLOPRAMIDE HCL 5 MG/ML IJ SOLN: 5.0000 mg | Freq: Three times a day (TID) | INTRAMUSCULAR | Status: DC | PRN

## 2021-10-25 MED ORDER — METOCLOPRAMIDE HCL 5 MG PO TABS: 5.0000 mg | ORAL_TABLET | Freq: Three times a day (TID) | ORAL | Status: DC | PRN

## 2021-10-25 MED ORDER — CEFAZOLIN SODIUM-DEXTROSE 2-4 GM/100ML-% IV SOLN
2.0000 g | Freq: Four times a day (QID) | INTRAVENOUS | Status: AC
  Administered 2021-10-25 (×2): 2 g via INTRAVENOUS
  Filled 2021-10-25 (×2): qty 100

## 2021-10-25 MED ORDER — MORPHINE SULFATE (PF) 2 MG/ML IV SOLN: 0.5000 mg | INTRAVENOUS | Status: DC | PRN

## 2021-10-25 MED ORDER — ONDANSETRON HCL 4 MG PO TABS: 4.0000 mg | ORAL_TABLET | Freq: Four times a day (QID) | ORAL | Status: DC | PRN

## 2021-10-25 MED ORDER — ACETAMINOPHEN 325 MG PO TABS
325.0000 mg | ORAL_TABLET | Freq: Four times a day (QID) | ORAL | Status: DC | PRN
  Administered 2021-10-25 – 2021-10-26 (×3): 650 mg via ORAL
  Filled 2021-10-25 (×3): qty 2

## 2021-10-25 MED ORDER — MUPIROCIN 2 % EX OINT
1.0000 "application " | TOPICAL_OINTMENT | Freq: Two times a day (BID) | CUTANEOUS | Status: DC
  Administered 2021-10-25 – 2021-10-27 (×5): 1 via NASAL
  Filled 2021-10-25: qty 22

## 2021-10-25 NOTE — Progress Notes (Signed)
? ? ?  Subjective: ? ?Patient reports pain as mild to moderate.  Denies N/V/CP/SOB/Abd pain. He is resting comfortably in the bed. Chronic foley catheter in place. Patient states he is having some pain in his left heel, nurse placed foam pad under heel for comfort. He reports some pain where his JP drain is in his thigh. Patient does not appear to be confused this morning. Patients nurse reports improvement. He is getting tylenol for pain.  ? ?His hemoglobin was 6.4 this morning and he was transfused with 1 unit of blood. He denies any dizziness.  ? ?Objective:  ? ?VITALS:   ?Vitals:  ? 10/25/21 0356 10/25/21 0444 10/25/21 0715 10/25/21 0825  ?BP: (!) 93/48 (!) 96/43 (!) 105/55 (!) 116/51  ?Pulse: 67 83 62 (!) 56  ?Resp: '16 16 16 14  '$ ?Temp: 97.7 ?F (36.5 ?C) (!) 97.5 ?F (36.4 ?C) 98.1 ?F (36.7 ?C) (!) 97.5 ?F (36.4 ?C)  ?TempSrc: Oral Oral Axillary   ?SpO2: 100%  97% 100%  ?Weight:      ?Height:      ? ?JP drain intact with ss fluid. 80 cc fluid output.  ? ?NAD ?Neurologically intact ?ABD soft ?Neurovascular intact ?Sensation intact distally ?Intact pulses distally ?Dorsiflexion/Plantar flexion intact ?Incision: dressing C/D/I ?No cellulitis present ?Compartment soft ? ?Patient reporting some pain in his left heel. I was unable to reproduce pain with palpation. Nurse placed foam pad under his heel for comfort.  ? ?Lab Results  ?Component Value Date  ? WBC 9.2 10/25/2021  ? HGB 6.4 (LL) 10/25/2021  ? HCT 19.9 (L) 10/25/2021  ? MCV 113.7 (H) 10/25/2021  ? PLT 134 (L) 10/25/2021  ? ?BMET ?   ?Component Value Date/Time  ? NA 135 10/23/2021 1441  ? K 4.1 10/23/2021 1441  ? CL 105 10/23/2021 1441  ? CO2 24 10/23/2021 1441  ? GLUCOSE 113 (H) 10/23/2021 1441  ? BUN 10 10/23/2021 1441  ? CREATININE 0.84 10/23/2021 1441  ? CALCIUM 9.1 10/23/2021 1441  ? GFRNONAA >60 10/23/2021 1441  ? ? ? ?Assessment/Plan: ?2 Days Post-Op  ? ?Principal Problem: ?  Femoral neck fracture ([place]) ?Active Problems: ?  Atrial fibrillation ([place]) ?   History of bladder cancer ?  HLD (hyperlipidemia) ?  HTN (hypertension) ?  Hypothyroidism ? ?Hemoglobin 6.4 this morning and was transfused with 1 unit of blood. Continue to closely monitor. ABLA on chronic anemia, transfuse if hgb <7.0.  ? ?INR 1.4 today.  ? ?Continue JP drain due to anticoagulation. Continue to monitor output.  ? ?WBAT with rolling walker ?DVT ppx: Warfarin with lovenox bridge, SCDs, TEDS ?PO pain control: hydrocodone and tylenol. ?PT/OT: Patient ambulated with PT and OT yesterday. Continue PT.  ?Dispo: Likely d/c to SNF per hospitalist note. Rx for pain medication in chart. He is currently just taking tylenol PRN pain. Will continue to monitor patient for confusion. If confusion continues will likely d/c narcotic pain medications.   ?Up with therapy ? ? ? ?Charlott Rakes ?10/25/2021, 1:21 PM ? ? ?Rod Can, MD ?[phone] ?[facility] [employer]  [facility] ?[address], [place], [place] [address] ?Phone: [phone] ?[email or web address] ?Facebook  Engineer, structural  ?  ?  ?

## 2021-10-25 NOTE — Progress Notes (Incomplete)
Date and time results received: 10/25/21 01:50am ? ?Test: hgb level ? ?Critical Value: 6.4 ? ?Name of Provider Notified: Hal Hope MD ? ?Orders Received? Or Actions Taken?:  ?

## 2021-10-25 NOTE — Progress Notes (Signed)
?PROGRESS NOTE ? ?Craig Snow:[id number] DOB: [birth year] DOA: 10/22/2021 ?PCP: Pcp, No ? ? LOS: 3 days  ? ?Brief Narrative / Interim history: ?Patient is a 88 year old male with history of paroxysmal A-fib, bladder cancer, hypothyroidism, hyperlipidemia, hypertension, frequent falls, previous history of right hip hemiarthroplasty who was recently discharged from skilled nursing facility to home presented after a fall while he was walking with a walker followed by left hip pain.  On presentation, he was hemodynamically stable.  CT head did not show any acute abnormality as well as CT cervical spine.  Left hip x-ray showed left femur fracture with varus angulation.Underwent left hip hemiarthroplasty by orthopedics on 4/22.  PT recommends SNF.  Hospital course complicated by intermittent delirium ? ?Subjective / 24h Interval events: ?He is doing well this morning, appears alert, no significant pain.  No nausea or vomiting. ? ?Assesement and Plan: ?Principal Problem: ?  Femoral neck fracture ([place]) ?Active Problems: ?  Atrial fibrillation ([place]) ?  History of bladder cancer ?  HLD (hyperlipidemia) ?  HTN (hypertension) ?  Hypothyroidism ? ?Principal problem ?Left femoral neck fracture-patient was admitted to the hospital after a fall while walking with his walker.  Imaging on admission showed left humeral fracture with varus angulation.  Orthopedic surgery consulted, he is status post left hip arthroplasty and also has a drain in place.  Currently on DVT prophylaxis with Lovenox/Coumadin.  Therapies recommend SNF ? ?Active problems ?Acute blood loss anemia-hemoglobin in the 6 range this morning, transfuse a unit of packed red blood cells, continue to closely monitor.  No active bleeding ? ?Paroxysmal A-fib-on rate control with metoprolol, anticoagulated with Coumadin now getting bridged with Lovenox. ? ?Hypothyroidism-continue Synthroid ? ?Essential hypertension-continue metoprolol, hold furosemide due to soft blood  pressures ? ?Hyperlipidemia-continue Lipitor ? ?History of bladder cancer-chronic Foley, continue Myrbetriq, tamsulosin ? ?Leukocytosis-likely reactive ? ?Delirium-acutely confused 4/23.  Started on low-dose Seroquel, continue.  Mental status appropriate this morning ? ?Scheduled Meds: ? Chlorhexidine Gluconate Cloth  6 each Topical Daily  ? docusate sodium  100 mg Oral BID  ? enoxaparin (LOVENOX) injection  40 mg Subcutaneous Q24H  ? furosemide  20 mg Oral q morning  ? levothyroxine  50 mcg Oral q morning  ? liothyronine  5 mcg Oral q morning  ? metoprolol tartrate  25 mg Oral BID  ? mirabegron ER  25 mg Oral Daily  ? pantoprazole  40 mg Oral q morning  ? QUEtiapine  25 mg Oral QHS  ? senna  1 tablet Oral BID  ? tamsulosin  0.4 mg Oral q morning  ? vitamin B-12  500 mcg Oral Daily  ? Warfarin - Pharmacist Dosing Inpatient   Does not apply G4010  ? ?Continuous Infusions: ?PRN Meds:.alum & mag hydroxide-simeth, diphenhydrAMINE, haloperidol lactate, melatonin, menthol-cetylpyridinium **OR** phenol, polyethylene glycol ? ?Diet Orders (From admission, onward)  ? ?  Start     Ordered  ? 10/24/21 1055  DIET - DYS 1 Room service appropriate? No; Fluid consistency: Nectar Thick  Diet effective now       ?Question Answer Comment  ?Room service appropriate? No   ?Fluid consistency: Nectar Thick   ?  ? 10/24/21 1054  ? ?  ?  ? ?  ? ? ?DVT prophylaxis: enoxaparin (LOVENOX) injection 40 mg Start: 10/24/21 0800 ?SCDs Start: 10/23/21 1514 ? ? ?Lab Results  ?Component Value Date  ? PLT 134 (L) 10/25/2021  ? ? ?  Code Status: DNR ? ?Family Communication: no family at  bedside  ? ?Status is: Inpatient ? ?Remains inpatient appropriate because: Acute blood loss anemia ? ?Level of care: Telemetry Surgical ? ?Consultants:  ?Orthopedic surgery  ? ?Procedures:  ?Hip fx repair ? ?Microbiology  ?none ? ?Antimicrobials: ?none  ? ? ?Objective: ?Vitals:  ? 10/25/21 0356 10/25/21 0444 10/25/21 0715 10/25/21 0825  ?BP: (!) 93/48 (!) 96/43 (!)  105/55 (!) 116/51  ?Pulse: 67 83 62 (!) 56  ?Resp: '16 16 16 14  '$ ?Temp: 97.7 ?F (36.5 ?C) (!) 97.5 ?F (36.4 ?C) 98.1 ?F (36.7 ?C) (!) 97.5 ?F (36.4 ?C)  ?TempSrc: Oral Oral Axillary   ?SpO2: 100%  97% 100%  ?Weight:      ?Height:      ? ? ?Intake/Output Summary (Last 24 hours) at 10/25/2021 0956 ?Last data filed at 10/25/2021 8850 ?Gross per 24 hour  ?Intake 315 ml  ?Output 105 ml  ?Net 210 ml  ? ?Wt Readings from Last 3 Encounters:  ?10/23/21 65.8 kg  ?07/29/21 65.8 kg  ? ? ?Examination: ? ?Constitutional: NAD ?Eyes: no scleral icterus ?ENMT: Mucous membranes are moist.  ?Neck: normal, supple ?Respiratory: clear to auscultation bilaterally, no wheezing, no crackles.  ?Cardiovascular: Regular rate and rhythm, no murmurs / rubs / gallops.  ?Abdomen: non distended, no tenderness. Bowel sounds positive.  ?Musculoskeletal: no clubbing / cyanosis.  ?Skin: no rashes ?Neurologic: Nonfocal ? ? ?Data Reviewed: I have independently reviewed following labs and imaging studies  ? ?CBC ?Recent Labs  ?Lab 10/22/21 ?2147 10/23/21 ?2774 10/24/21 ?0158 10/25/21 ?1287  ?WBC 11.2* 12.2* 12.7* 9.2  ?HGB 10.6* 10.3* 8.0* 6.4*  ?HCT 32.2* 31.6* 25.3* 19.9*  ?PLT 163 156 129* 134*  ?MCV 113.4* 113.7* 114.0* 113.7*  ?MCH 37.3* 37.1* 36.0* 36.6*  ?MCHC 32.9 32.6 31.6 32.2  ?RDW 13.5 13.5 13.2 13.4  ?LYMPHSABS 0.9  --   --   --   ?MONOABS 1.0  --   --   --   ?EOSABS 0.0  --   --   --   ?BASOSABS 0.0  --   --   --   ? ? ?Recent Labs  ?Lab 10/22/21 ?2147 10/23/21 ?8676 10/23/21 ?1441 10/24/21 ?0158 10/25/21 ?7209  ?NA 136 135 135  --   --   ?K 4.8 4.5 4.1  --   --   ?CL 103 104 105  --   --   ?CO2 '25 24 24  '$ --   --   ?GLUCOSE 197* 140* 113*  --   --   ?BUN '17 14 10  '$ --   --   ?CREATININE 1.01 0.86 0.84  --   --   ?CALCIUM 9.3 9.1 9.1  --   --   ?AST 30  --   --   --   --   ?ALT 11  --   --   --   --   ?ALKPHOS 42  --   --   --   --   ?BILITOT 0.9  --   --   --   --   ?ALBUMIN 3.2*  --   --   --   --   ?INR 1.8*  --   --  1.7* 1.4*   ? ? ?------------------------------------------------------------------------------------------------------------------ ?No results for input(s): CHOL, HDL, LDLCALC, TRIG, CHOLHDL, LDLDIRECT in the last 72 hours. ? ?No results found for: HGBA1C ?------------------------------------------------------------------------------------------------------------------ ?No results for input(s): TSH, T4TOTAL, T3FREE, THYROIDAB in the last 72 hours. ? ?Invalid input(s): FREET3 ? ?Cardiac Enzymes ?No results for input(s):  CKMB, TROPONINI, MYOGLOBIN in the last 168 hours. ? ?Invalid input(s): CK ?------------------------------------------------------------------------------------------------------------------ ?No results found for: BNP ? ?CBG: ?No results for input(s): GLUCAP in the last 168 hours. ? ?Recent Results (from the past 240 hour(s))  ?Surgical pcr screen     Status: Abnormal  ? Collection Time: 10/23/21 10:37 AM  ? Specimen: Nasal Mucosa; Nasal Swab  ?Result Value Ref Range Status  ? MRSA, PCR POSITIVE (A) NEGATIVE Final  ? Staphylococcus aureus POSITIVE (A) NEGATIVE Final  ?  Comment: RESULTS CALLED TO,READ BACK BY AND VERIFIED WITH RN T.ISAACS '@1402'$  ON 10/23/21 BY NM ?(NOTE) ?The Xpert SA Assay (FDA approved for NASAL specimens in patients 35 ?years of age and older), is one component of a comprehensive ?surveillance program. It is not intended to diagnose infection nor to ?guide or monitor treatment. ?Performed at [facility], [place] [address] [place], [place] ?[address] ?  ?  ? ?Radiology Studies: ?No results found. ? ? ?Marzetta Board, MD, PhD ?[facility] ? ?Between 7 am - 7 pm I am available, please contact me via Amion (for emergencies) or Securechat (non urgent messages) ? ?Between 7 pm - 7 am I am not available, please contact night coverage MD/APP via Amion ? ?

## 2021-10-25 NOTE — TOC CAGE-AID Note (Signed)
Transition of Care (TOC) - CAGE-AID Screening ? ? ?Patient Details  ?Name: Craig Snow ?MRN: [id number] ?Date of Birth: 11/18/1920 ? ?Transition of Care (TOC) CM/SW Contact:    ?Kilani Joffe C Tarpley-Carter, LCSWA ?Phone Number: ?10/25/2021, 1:29 PM ? ? ?Clinical Narrative: ?Pt is unable to participate in Cage Aid. ? ?Passenger transport manager, MSW, LCSW-A ?Pronouns:  She/Her/Hers ?[facility]itions of Care ?Clinical Social Worker ?Direct Number:  [phone] ?[email or web address] ? ? ?CAGE-AID Screening: ?Substance Abuse Screening unable to be completed due to: : Patient unable to participate ? ?  ?  ?  ?  ?  ? ?Substance Abuse Education Offered: No ? ?  ? ? ? ? ? ? ?

## 2021-10-25 NOTE — Care Management Important Message (Signed)
Important Message ? ?Patient Details  ?Name: Craig Snow ?MRN: [id number] ?Date of Birth: 1921-01-05 ? ? ?Medicare Important Message Given:  Yes ? ? ? ? ?Craig Snow ?10/25/2021, 4:25 PM ?

## 2021-10-25 NOTE — [place] FL2 (Signed)
?[place] MEDICAID FL2 LEVEL OF CARE SCREENING TOOL  ?  ? ?IDENTIFICATION  ?Patient Name: ?Craig Snow Birthdate: 11-10-1920 Sex: male Admission Date (Current Location): ?10/22/2021  ?[place] and [place] Number: ? [place] ?  Facility and Address:  ?[facility], [place] [address], [place], [place] [address] ?     Provider Number: ?[id number]  ?Attending Physician Name and Address:  ?Caren Griffins, MD ? Relative Name and Phone Number:  ?Klever, [place]   [phone] ?   ?Current Level of Care: ?Hospital Recommended Level of Care: ?[place] Prior Approval Number: ?  ? ?Date Approved/Denied: ?  PASRR Number: ?[id number] A ? ?Discharge Plan: ?SNF ?  ? ?Current Diagnoses: ?Patient Active Problem List  ? Diagnosis Date Noted  ? Femoral neck fracture ([place]) 10/22/2021  ? HLD (hyperlipidemia) 10/22/2021  ? HTN (hypertension) 10/22/2021  ? Hypothyroidism 10/22/2021  ? Stage 1 skin ulcer of sacral region ([place]) 07/30/2021  ? History of bladder cancer 07/30/2021  ? Atrial fibrillation ([place]) 07/29/2021  ? ? ?Orientation RESPIRATION BLADDER Height & Weight   ?  ?Self, Time, Place ? O2 Continent, Indwelling catheter Weight: 145 lb 1 oz (65.8 kg) ?Height:  '5\' 11"'$  (180.3 cm)  ?BEHAVIORAL SYMPTOMS/MOOD NEUROLOGICAL BOWEL NUTRITION STATUS  ?    Continent Diet (see discharge summary)  ?AMBULATORY STATUS COMMUNICATION OF NEEDS Skin   ?Limited Assist Verbally Surgical wounds ?  ?  ?  ?    ?     ?     ? ? ?Personal Care Assistance Level of Assistance  ?Bathing, Feeding, Dressing Bathing Assistance: Limited assistance ?Feeding assistance: Limited assistance ?Dressing Assistance: Limited assistance ?   ? ?Functional Limitations Info  ?Sight, Hearing, Speech Sight Info: Adequate ?Hearing Info: Adequate ?Speech Info: Adequate  ? ? ?SPECIAL CARE FACTORS FREQUENCY  ?PT (By licensed PT), OT (By licensed OT)   ?  ?PT Frequency: 5x week ?OT Frequency: 5x week ?  ?  ?  ?   ? ? ?Contractures Contractures  Info: Not present  ? ? ?Additional Factors Info  ?Code Status, Allergies Code Status Info: DNR ?Allergies Info: : Aspirin, Clindamycin/lincomycin, Other, Sulfa Antibiotics ?  ?  ?  ?   ? ?Current Medications (10/25/2021):  This is the current hospital active medication list ?Current Facility-Administered Medications  ?Medication Dose Route Frequency Provider Last Rate Last Admin  ? acetaminophen (TYLENOL) tablet 325-650 mg  325-650 mg Oral Q6H PRN Rod Can, MD   650 mg at 10/25/21 1226  ? alum & mag hydroxide-simeth (MAALOX/MYLANTA) 200-200-20 MG/5ML suspension 30 mL  30 mL Oral Q4H PRN Swinteck, Aaron Edelman, MD      ? ceFAZolin (ANCEF) IVPB 2g/100 mL premix  2 g Intravenous Q6H Swinteck, Aaron Edelman, MD      ? Chlorhexidine Gluconate Cloth 2 % PADS 6 each  6 each Topical Daily Shelly Coss, MD   6 each at 10/25/21 1024  ? Chlorhexidine Gluconate Cloth 2 % PADS 6 each  6 each Topical Q0600 Caren Griffins, MD      ? diphenhydrAMINE (BENADRYL) 12.5 MG/5ML elixir 12.5-25 mg  12.5-25 mg Oral Q4H PRN Swinteck, Aaron Edelman, MD      ? docusate sodium (COLACE) capsule 100 mg  100 mg Oral BID Rod Can, MD   100 mg at 10/23/21 2147  ? enoxaparin (LOVENOX) injection 40 mg  40 mg Subcutaneous Q24H Rod Can, MD   40 mg at 10/25/21 1006  ? haloperidol lactate (HALDOL) injection 2 mg  2  mg Intravenous Q6H PRN Shelly Coss, MD      ? HYDROcodone-acetaminophen (NORCO) 7.5-325 MG per tablet 1-2 tablet  1-2 tablet Oral Q4H PRN Swinteck, Aaron Edelman, MD      ? HYDROcodone-acetaminophen (NORCO/VICODIN) 5-325 MG per tablet 1-2 tablet  1-2 tablet Oral Q4H PRN Swinteck, Aaron Edelman, MD      ? levothyroxine (SYNTHROID) tablet 50 mcg  50 mcg Oral q morning Rod Can, MD   50 mcg at 10/25/21 0537  ? liothyronine (CYTOMEL) tablet 5 mcg  5 mcg Oral q morning Rod Can, MD   5 mcg at 10/25/21 1006  ? melatonin tablet 10 mg  10 mg Oral QHS PRN Rod Can, MD   10 mg at 10/24/21 2029  ? menthol-cetylpyridinium (CEPACOL) lozenge 3  mg  1 lozenge Oral PRN Swinteck, Aaron Edelman, MD      ? Or  ? phenol (CHLORASEPTIC) mouth spray 1 spray  1 spray Mouth/Throat PRN Swinteck, Aaron Edelman, MD      ? metoCLOPramide (REGLAN) tablet 5-10 mg  5-10 mg Oral Q8H PRN Swinteck, Aaron Edelman, MD      ? Or  ? metoCLOPramide (REGLAN) injection 5-10 mg  5-10 mg Intravenous Q8H PRN Swinteck, Aaron Edelman, MD      ? metoprolol tartrate (LOPRESSOR) tablet 25 mg  25 mg Oral BID Rod Can, MD   25 mg at 10/25/21 1006  ? mirabegron ER (MYRBETRIQ) tablet 25 mg  25 mg Oral Daily Rod Can, MD   25 mg at 10/25/21 1006  ? morphine (PF) 2 MG/ML injection 0.5-1 mg  0.5-1 mg Intravenous Q2H PRN Swinteck, Aaron Edelman, MD      ? mupirocin ointment (BACTROBAN) 2 % 1 application.  1 application. Nasal BID Caren Griffins, MD   1 application. at 10/25/21 1227  ? ondansetron (ZOFRAN) tablet 4 mg  4 mg Oral Q6H PRN Swinteck, Aaron Edelman, MD      ? Or  ? ondansetron (ZOFRAN) injection 4 mg  4 mg Intravenous Q6H PRN Swinteck, Aaron Edelman, MD      ? pantoprazole (PROTONIX) EC tablet 40 mg  40 mg Oral q morning Rod Can, MD   40 mg at 10/25/21 1027  ? polyethylene glycol (MIRALAX / GLYCOLAX) packet 17 g  17 g Oral Daily PRN Swinteck, Aaron Edelman, MD      ? QUEtiapine (SEROQUEL) tablet 25 mg  25 mg Oral QHS Shelly Coss, MD   25 mg at 10/24/21 2030  ? senna (SENOKOT) tablet 8.6 mg  1 tablet Oral BID Rod Can, MD   8.6 mg at 10/23/21 2147  ? tamsulosin (FLOMAX) capsule 0.4 mg  0.4 mg Oral q morning Rod Can, MD   0.4 mg at 10/25/21 1006  ? vitamin B-12 (CYANOCOBALAMIN) tablet 500 mcg  500 mcg Oral Daily Shelly Coss, MD   500 mcg at 10/25/21 1006  ? warfarin (COUMADIN) tablet 5 mg  5 mg Oral ONCE-1600 Dang, Thuy D, RPH      ? Warfarin - Pharmacist Dosing Inpatient   Does not apply q1600 Paytes, Earl Gala, RPH      ? ? ? ?Discharge Medications: ?Please see discharge summary for a list of discharge medications. ? ?Relevant Imaging Results: ? ?Relevant Lab Results: ? ? ?Additional Information ?SSN  [id number] ? ?Joanne Chars, LCSW ? ? ? ? ?

## 2021-10-25 NOTE — Progress Notes (Signed)
ANTICOAGULATION CONSULT NOTE ? ?Pharmacy Consult for Warfarin  ?Indication: atrial fibrillation ? ?Allergies  ?Allergen Reactions  ? Aspirin Other (See Comments)  ?  Unknown reaction - listed on [facility] 07/30/21  ? Clindamycin/Lincomycin Other (See Comments)  ?  Unknown reaction - listed on [facility] 07/30/21  ? Other Other (See Comments)  ?  Unknown reaction to opioids - listed on [facility] 07/30/21  ? Sulfa Antibiotics Other (See Comments)  ?  Unknown reaction - listed on [facility] 07/30/21  ? ? ?Patient Measurements: ?Height: '5\' 11"'$  (180.3 cm) ?Weight: 65.8 kg (145 lb 1 oz) ?IBW/kg (Calculated) : 75.3 ? ?Vital Signs: ?Temp: 97.5 ?F (36.4 ?C) (04/24 0825) ?Temp Source: Axillary (04/24 0715) ?BP: 116/51 (04/24 0825) ?Pulse Rate: 56 (04/24 0825) ? ?Labs: ?Recent Labs  ?  10/22/21 ?2147 10/23/21 ?9381 10/23/21 ?1441 10/24/21 ?0158 10/25/21 ?8299  ?HGB 10.6* 10.3*  --  8.0* 6.4*  ?HCT 32.2* 31.6*  --  25.3* 19.9*  ?PLT 163 156  --  129* 134*  ?LABPROT 20.4*  --   --  20.2* 17.0*  ?INR 1.8*  --   --  1.7* 1.4*  ?CREATININE 1.01 0.86 0.84  --   --   ? ? ? ?Estimated Creatinine Clearance: 45.7 mL/min (by C-G formula based on SCr of 0.84 mg/dL). ? ? ?Assessment: ?85 YOM presenting with fall resulting in hip fracture s/p repair 4/22 (EBL -235m). Patient is on warfarin PTA for atrial fibrillation. He received 10 mg IV Vitamin K 4/22 AM. Pharmacy has been consulted for warfarin dosing.  ? ?INR trended down further to 1.4.  Patient has ABLA with hemoglobin at 6.4 and received transfusion.  No overt bleeding per discussion with RN, just serosanguinous JP drainage. ? ?PTA regimen: 2 mg Sun/Tues, 3 mg Mon/Wed/Thurs/Fri/Sat  ? ?Goal of Therapy:  ?INR 2-3 ?Monitor platelets by anticoagulation protocol: Yes ?  ?Plan:  ?Coumadin '5mg'$  PO today ?Lovenox '40mg'$  SQ daily until INR therapeutic ?Daily PT / INR  ? ?Octavion Mollenkopf D. DMina Marble PharmD, BCPS, BCCCP ?10/25/2021, 10:08 AM ? ?

## 2021-10-26 DIAGNOSIS — Z8551 Personal history of malignant neoplasm of bladder: Secondary | ICD-10-CM | POA: Diagnosis not present

## 2021-10-26 DIAGNOSIS — I480 Paroxysmal atrial fibrillation: Secondary | ICD-10-CM | POA: Diagnosis not present

## 2021-10-26 DIAGNOSIS — E785 Hyperlipidemia, unspecified: Secondary | ICD-10-CM | POA: Diagnosis not present

## 2021-10-26 DIAGNOSIS — S72002A Fracture of unspecified part of neck of left femur, initial encounter for closed fracture: Secondary | ICD-10-CM | POA: Diagnosis not present

## 2021-10-26 LAB — BASIC METABOLIC PANEL
Anion gap: 6 (ref 5–15)
BUN: 24 mg/dL — ABNORMAL HIGH (ref 8–23)
CO2: 24 mmol/L (ref 22–32)
Calcium: 8.2 mg/dL — ABNORMAL LOW (ref 8.9–10.3)
Chloride: 106 mmol/L (ref 98–111)
Creatinine, Ser: 1.03 mg/dL (ref 0.61–1.24)
GFR, Estimated: >60 mL/min (ref >60)
Glucose, Bld: 124 mg/dL — ABNORMAL HIGH (ref 70–99)
Potassium: 3.8 mmol/L (ref 3.5–5.1)
Sodium: 136 mmol/L (ref 135–145)

## 2021-10-26 LAB — CBC
HCT: 24.7 % — ABNORMAL LOW (ref 39.0–52.0)
Hemoglobin: 8 g/dL — ABNORMAL LOW (ref 13.0–17.0)
MCH: 34.5 pg — ABNORMAL HIGH (ref 26.0–34.0)
MCHC: 32.4 g/dL (ref 30.0–36.0)
MCV: 106.5 fL — ABNORMAL HIGH (ref 80.0–100.0)
Platelets: 125 10*3/uL — ABNORMAL LOW (ref 150–400)
RBC: 2.32 MIL/uL — ABNORMAL LOW (ref 4.22–5.81)
RDW: 17.2 % — ABNORMAL HIGH (ref 11.5–15.5)
WBC: 8.5 10*3/uL (ref 4.0–10.5)
nRBC: 0 % (ref 0.0–0.2)

## 2021-10-26 LAB — PROTIME-INR
INR: 1.5 — ABNORMAL HIGH (ref 0.8–1.2)
Prothrombin Time: 18.4 seconds — ABNORMAL HIGH (ref 11.4–15.2)

## 2021-10-26 LAB — BPAM RBC
Blood Product Expiration Date: 202305122359
ISSUE DATE / TIME: 202304240412
Unit Type and Rh: 6200

## 2021-10-26 LAB — TYPE AND SCREEN
ABO/RH(D): A POS
Antibody Screen: NEGATIVE
Unit division: 0

## 2021-10-26 LAB — VITAMIN D 25 HYDROXY (VIT D DEFICIENCY, FRACTURES): Vit D, 25-Hydroxy: 29.58 ng/mL — ABNORMAL LOW (ref 30–100)

## 2021-10-26 MED ORDER — VITAMIN D (ERGOCALCIFEROL) 1.25 MG (50000 UNIT) PO CAPS
50000.0000 [IU] | ORAL_CAPSULE | ORAL | Status: DC
  Administered 2021-10-26: 50000 [IU] via ORAL
  Filled 2021-10-26: qty 1

## 2021-10-26 MED ORDER — WARFARIN SODIUM 5 MG PO TABS
5.0000 mg | ORAL_TABLET | Freq: Once | ORAL | Status: AC
  Administered 2021-10-26: 5 mg via ORAL
  Filled 2021-10-26: qty 1

## 2021-10-26 NOTE — Progress Notes (Signed)
Physical Therapy Treatment ?Patient Details ?Name: Craig Snow ?MRN: [id number] ?DOB: 23-Apr-1921 ?Today's Date: 10/26/2021 ? ? ?History of Present Illness Pt is a 88 y.o. male with history of right hip hemiarthroplasty who was recently discharged from skilled nursing facility to home presented 4/21 after a fall; s/p left hip hemiarthroplasty by orthopedics on 4/22. PMH significant for bladder cancer and chronic urinary retention, a-fib, HTN, and HLD, ? ?  ?PT Comments  ? ? Continued progression of functional mobility and gait. The patient was able to perform bed mobility and STS transfers min A, requiring support during the rise for a posterior bias. He was min A for gait for directions and sequencing cues. The patient shows limited problem solving and focused attention during gait, needing verbal, tactile, and visual cues for when to stop. He continues to be limited by overall LE strength, L LE ROM, balance, and functional endurance. Pt would benefit from continued PT services to maximize independence and progress functional mobility, balance, and gait as tolerated. Plan and d/c recommendations remain unchanged. PT to follow up acutely as able.  ?   ?Recommendations for follow up therapy are one component of a multi-disciplinary discharge planning process, led by the attending physician.  Recommendations may be updated based on patient status, additional functional criteria and insurance authorization. ? ?Follow Up Recommendations ? Skilled nursing-short term rehab (<3 hours/day) ?  ?  ?Assistance Recommended at Discharge Frequent or constant Supervision/Assistance  ?Patient can return home with the following Assistance with cooking/housework;Direct supervision/assist for medications management;Direct supervision/assist for financial management;Assist for transportation;Help with stairs or ramp for entrance;A little help with walking and/or transfers;A little help with bathing/dressing/bathroom ?  ?Equipment  Recommendations ?  (Defer to next venue)  ?  ?   ?Precautions / Restrictions Precautions ?Precautions: Fall ?Precaution Comments: JP drain Lt hip ?Restrictions ?Weight Bearing Restrictions: Yes ?LLE Weight Bearing: Weight bearing as tolerated  ?  ? ?Mobility ? Bed Mobility ?Overal bed mobility: Needs Assistance ?Bed Mobility: Supine to Sit ?  ?  ?Supine to sit: Min assist ?  ?  ?General bed mobility comments: Min A for sequencing cues and for truncal support during rise to EOB. ?  ? ?Transfers ?Overall transfer level: Needs assistance ?Equipment used: Rolling walker (2 wheels) ?Transfers: Sit to/from Stand ?Sit to Stand: Min assist ?  ?  ?  ?  ?  ?General transfer comment: Min A for power secondary to posterior preference. Min A for steady during rise but pt able to maintain balance once standing. Pt pushes up from EOB and when lowering to chair reaches back to control decent. ?  ? ?Ambulation/Gait ?Ambulation/Gait assistance: Min assist ?Gait Distance (Feet): 55 Feet ?Assistive device: Rolling walker (2 wheels) ?Gait Pattern/deviations: Step-through pattern, Decreased stride length, Decreased stance time - left, Drifts right/left, Trunk flexed, Knee flexed in stance - right, Knee flexed in stance - left ?Gait velocity: decreased ?  ?  ?General Gait Details: Min A for directional cues during gait with chair follow. The pt. has a general flexed posutre and shows almost equal WB through both legs. Verbal, tactile, and visual cues used for directions. ? ? ? ? ? ?  ?Balance Overall balance assessment: Needs assistance ?Sitting-balance support: No upper extremity supported, Feet supported ?Sitting balance-Leahy Scale: Fair ?Sitting balance - Comments: Able to maintain EOB w/o difficulty ?  ?Standing balance support: Bilateral upper extremity supported ?Standing balance-Leahy Scale: Poor ?Standing balance comment: Requires RW or support to maintain standing balance. ?  ?  ?  ?  ?  ?  ?  ?  ?  ?  ?  ?  ? ?  ?  Cognition  Arousal/Alertness: Awake/alert ?Behavior During Therapy: [facility] for tasks assessed/performed ?Overall Cognitive Status: No family/caregiver present to determine baseline cognitive functioning ?Area of Impairment: Following commands, Safety/judgement, Problem solving ?  ?  ?  ?  ?  ?  ?  ?  ?  ?  ?  ?Following Commands: Follows multi-step commands with increased time, Follows multi-step commands inconsistently ?Safety/Judgement: Decreased awareness of safety, Decreased awareness of deficits ?  ?Problem Solving: Requires verbal cues, Requires tactile cues ?General Comments: Pt very hard of hearing, requires verbal, written, and repeated cues. Increased processing time suspect that is related to hearing deficit. Limited problem solving navigating through hallway. ?  ?  ? ?  ?Exercises Total Joint Exercises ?Ankle Circles/Pumps: AROM, Both, 20 reps ?Quad Sets: AROM, Left, 10 reps ?Heel Slides: AROM, Left, 10 reps ? ?  ?General Comments General comments (skin integrity, edema, etc.): Pt shows redness on back and buttocks, RN notified and applied sacral pad. ?  ?  ? ?Pertinent Vitals/Pain Pain Assessment ?Faces Pain Scale: Hurts little more ?Pain Location: Left hip, ankle, and heel ?Pain Descriptors / Indicators: Aching, Guarding ?Pain Intervention(s): Limited activity within patient's tolerance, Monitored during session  ? ? ? ?PT Goals (current goals can now be found in the care plan section) Acute Rehab PT Goals ?Patient Stated Goal: Walk more ?PT Goal Formulation: With patient ?Time For Goal Achievement: 11/07/21 ?Potential to Achieve Goals: Good ?Progress towards PT goals: Progressing toward goals ? ?  ?Frequency ? ? ? Min 3X/week ? ? ? ?  ?PT Plan Current plan remains appropriate  ? ? ?   ?AM-PAC PT "6 Clicks" Mobility   ?Outcome Measure ? Help needed turning from your back to your side while in a flat bed without using bedrails?: A Little ?Help needed moving from lying on your back to sitting on the side of a flat bed  without using bedrails?: A Little ?Help needed moving to and from a bed to a chair (including a wheelchair)?: A Little ?Help needed standing up from a chair using your arms (e.g., wheelchair or bedside chair)?: A Little ?Help needed to walk in hospital room?: A Little ?Help needed climbing 3-5 steps with a railing? : Total ?6 Click Score: 16 ? ?  ?End of Session Equipment Utilized During Treatment: Gait belt ?Activity Tolerance: Patient tolerated treatment well ?Patient left: in chair;with call bell/phone within reach;with chair alarm set ?Nurse Communication: Mobility status ?PT Visit Diagnosis: Difficulty in walking, not elsewhere classified (R26.2);Pain;History of falling (Z91.81);Muscle weakness (generalized) (M62.81);Repeated falls (R29.6);Unsteadiness on feet (R26.81) ?Pain - Right/Left: Left ?Pain - part of body: Hip ?  ? ? ?Time: 0102-7253 ?PT Time Calculation (min) (ACUTE ONLY): 25 min ? ?Charges:  $Gait Training: 8-22 mins ?$Therapeutic Activity: 8-22 mins          ?          ? ?Thermon Leyland, SPT ?Acute Rehab Services ? ? ? ?Thermon Leyland ?10/26/2021, 4:07 PM ? ?

## 2021-10-26 NOTE — Progress Notes (Signed)
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 ?NP on call aware, this is not new for pt. ?

## 2021-10-26 NOTE — Progress Notes (Signed)
ANTICOAGULATION CONSULT NOTE ? ?Pharmacy Consult for Warfarin ?Indication: atrial fibrillation ? ?Allergies  ?Allergen Reactions  ? Aspirin Other (See Comments)  ?  Unknown reaction - listed on [facility] 07/30/21  ? Clindamycin/Lincomycin Other (See Comments)  ?  Unknown reaction - listed on [facility] 07/30/21  ? Other Other (See Comments)  ?  Unknown reaction to opioids - listed on [facility] 07/30/21  ? Sulfa Antibiotics Other (See Comments)  ?  Unknown reaction - listed on [facility] 07/30/21  ? ?Patient Measurements: ?Height: '5\' 11"'$  (180.3 cm) ?Weight: 65.8 kg (145 lb 1 oz) ?IBW/kg (Calculated) : 75.3 ? ?Vital Signs: ?Temp: 97.8 ?F (36.6 ?C) (04/25 0751) ?Temp Source: Oral (04/25 0751) ?BP: 99/53 (04/25 0751) ?Pulse Rate: 69 (04/25 0751) ? ?Labs: ?Recent Labs  ?  10/23/21 ?1441 10/24/21 ?0158 10/24/21 ?0158 10/25/21 ?2202 10/26/21 ?0205  ?HGB  --  8.0*   < > 6.4* 8.0*  ?HCT  --  25.3*  --  19.9* 24.7*  ?PLT  --  129*  --  134* 125*  ?LABPROT  --  20.2*  --  17.0* 18.4*  ?INR  --  1.7*  --  1.4* 1.5*  ?CREATININE 0.84  --   --   --  1.03  ? < > = values in this interval not displayed.  ? ? ?Estimated Creatinine Clearance: 37.3 mL/min (by C-G formula based on SCr of 1.03 mg/dL). ? ? ?PTA Anticoagulation Regimen:  ?2 mg Sun/Tues ?3 mg Mon/Wed/Thurs/Fri/Sat  ? ?Assessment: ?36 YOM presenting with fall resulting in hip fracture s/p repair 4/22 (EBL -268m). Patient is on warfarin PTA for atrial fibrillation. He received 10 mg IV Vitamin K 4/22 AM. Pharmacy has been consulted for warfarin dosing.  ?  ?INR remains subtherapeutic at 1.5. Patient had ABLA with hemoglobin dropped to 6.4 s/p x1 pRBC now hgb 8.0.  No overt bleeding, just serosanguinous JP drainage.  ? ?Goal of Therapy:  ?INR 2-3 ?Monitor platelets by anticoagulation protocol: Yes ?  ?Plan:  ?Give warfarin 5 mg PO x1 dose ?Check INR daily while on warfarin ?Continue to monitor H&H and platelets ? ? ? ?Thank you for allowing pharmacy to be a part of this patient?s care. ? ?AArdyth Harps PharmD ?Clinical Pharmacist ? ?

## 2021-10-26 NOTE — Progress Notes (Signed)
CSW attempted to reach pt son [name], no answer, LM. ?CSW was able to reach pt so Shanon Brow. [place]on Brow [place], said [name] is more the Media planner for pt.  Pt just DC from [facility] and did have good experience there, would want to return.  CSW spoke with Janie/[facility], pt was there 71 days but can return.  FL2 completed and sent. ?Lurline Idol, MSW, LCSW ?4/25/202310:03 AM  ?

## 2021-10-26 NOTE — Progress Notes (Signed)
?PROGRESS NOTE ? ?Craig Snow QAS:[id number] DOB: 04/10/1921 DOA: 10/22/2021 ?PCP: Pcp, No ? ? LOS: 4 days  ? ?Brief Narrative / Interim history: ?Patient is a 88 year old male with history of paroxysmal A-fib, bladder cancer, hypothyroidism, hyperlipidemia, hypertension, frequent falls, previous history of right hip hemiarthroplasty who was recently discharged from skilled nursing facility to home presented after a fall while he was walking with a walker followed by left hip pain.  On presentation, he was hemodynamically stable.  CT head did not show any acute abnormality as well as CT cervical spine.  Left hip x-ray showed left femur fracture with varus angulation.Underwent left hip hemiarthroplasty by orthopedics on 4/22.  PT recommends SNF.  Hospital course complicated by intermittent delirium ? ?Subjective / 24h Interval events: ?Eating breakfast.  No specific complaints, states that he is doing "pretty good" ? ?Assesement and Plan: ?Principal Problem: ?  Femoral neck fracture ([place]) ?Active Problems: ?  Atrial fibrillation ([place]) ?  History of bladder cancer ?  HLD (hyperlipidemia) ?  HTN (hypertension) ?  Hypothyroidism ? ?Principal problem ?Left femoral neck fracture-patient was admitted to the hospital after a fall while walking with his walker.  Imaging on admission showed left humeral fracture with varus angulation.  Orthopedic surgery consulted, he is status post left hip arthroplasty and also has a drain in place.  Currently on DVT prophylaxis with Lovenox/Coumadin.  Therapies recommend SNF, placement is pending.  Still has a drain in place, defer management to the surgical team ? ?Active problems ?Acute blood loss anemia-hemoglobin decreased to 6 on 4/24, status post unit of packed red blood cells.  Hemoglobin improved appropriately at 8 this morning.  No bleeding.  Continue to monitor, if stable tomorrow could potentially be discharged to SNF ? ?Paroxysmal A-fib-on rate control with metoprolol,  anticoagulated with Coumadin now getting bridged with Lovenox.  Continue to monitor daily INRs ? ?Hypothyroidism-continue Synthroid ? ?Essential hypertension-continue metoprolol with holding parameters, hold furosemide due to soft blood pressures ? ?Hyperlipidemia-continue Lipitor ? ?History of bladder cancer-chronic Foley, continue Myrbetriq, tamsulosin ? ?Leukocytosis-likely reactive ? ?Delirium-acutely confused 4/23.  Started on low-dose Seroquel, continue.  Mental status appropriate this morning ? ?Scheduled Meds: ? Chlorhexidine Gluconate Cloth  6 each Topical Q0600  ? docusate sodium  100 mg Oral BID  ? enoxaparin (LOVENOX) injection  40 mg Subcutaneous Q24H  ? levothyroxine  50 mcg Oral q morning  ? liothyronine  5 mcg Oral q morning  ? metoprolol tartrate  25 mg Oral BID  ? mirabegron ER  25 mg Oral Daily  ? mupirocin ointment  1 application. Nasal BID  ? pantoprazole  40 mg Oral q morning  ? QUEtiapine  25 mg Oral QHS  ? senna  1 tablet Oral BID  ? tamsulosin  0.4 mg Oral q morning  ? vitamin B-12  500 mcg Oral Daily  ? Vitamin D (Ergocalciferol)  50,000 Units Oral Q Tue  ? warfarin  5 mg Oral ONCE-1600  ? Warfarin - Pharmacist Dosing Inpatient   Does not apply N9892  ? ?Continuous Infusions: ?PRN Meds:.acetaminophen, alum & mag hydroxide-simeth, diphenhydrAMINE, haloperidol lactate, HYDROcodone-acetaminophen, HYDROcodone-acetaminophen, melatonin, menthol-cetylpyridinium **OR** phenol, metoCLOPramide **OR** metoCLOPramide (REGLAN) injection, morphine injection, ondansetron **OR** ondansetron (ZOFRAN) IV, polyethylene glycol ? ?Diet Orders (From admission, onward)  ? ?  Start     Ordered  ? 10/24/21 1055  DIET - DYS 1 Room service appropriate? No; Fluid consistency: Nectar Thick  Diet effective now       ?Question Answer Comment  ?  Room service appropriate? No   ?Fluid consistency: Nectar Thick   ?  ? 10/24/21 1054  ? ?  ?  ? ?  ? ? ?DVT prophylaxis: enoxaparin (LOVENOX) injection 40 mg Start: 10/24/21  0800 ?SCDs Start: 10/23/21 1514 ?warfarin (COUMADIN) tablet 5 mg  ? ?Lab Results  ?Component Value Date  ? PLT 125 (L) 10/26/2021  ? ? ?  Code Status: DNR ? ?Family Communication: no family at bedside  ? ?Status is: Inpatient ? ?Remains inpatient appropriate because: Acute blood loss anemia ? ?Level of care: Telemetry Surgical ? ?Consultants:  ?Orthopedic surgery  ? ?Procedures:  ?Hip fx repair ? ?Microbiology  ?none ? ?Antimicrobials: ?none  ? ? ?Objective: ?Vitals:  ? 10/25/21 2118 10/25/21 2300 10/26/21 0302 10/26/21 0751  ?BP: (!) 101/48  (!) 99/50 (!) 99/53  ?Pulse:    69  ?Resp:   16 18  ?Temp:   98 ?F (36.7 ?C) 97.8 ?F (36.6 ?C)  ?TempSrc:   Oral Oral  ?SpO2:  95% 98% 95%  ?Weight:      ?Height:      ? ? ?Intake/Output Summary (Last 24 hours) at 10/26/2021 1108 ?Last data filed at 10/26/2021 0700 ?Gross per 24 hour  ?Intake 360 ml  ?Output 1680 ml  ?Net -1320 ml  ? ? ?Wt Readings from Last 3 Encounters:  ?10/23/21 65.8 kg  ?07/29/21 65.8 kg  ? ? ?Examination: ? ?Constitutional: NAD ?Eyes: lids and conjunctivae normal, no scleral icterus ?ENMT: mmm ?Neck: normal, supple ?Respiratory: clear to auscultation bilaterally, no wheezing, no crackles. Normal respiratory effort.  ?Cardiovascular: Regular rate and rhythm, no murmurs / rubs / gallops.  ?Abdomen: soft, no distention, no tenderness. Bowel sounds positive.  ?Skin: no rashes ?Neurologic: no focal deficits, equal strength ? ? ?Data Reviewed: I have independently reviewed following labs and imaging studies  ? ?CBC ?Recent Labs  ?Lab 10/22/21 ?2147 10/23/21 ?0086 10/24/21 ?0158 10/25/21 ?7619 10/26/21 ?0205  ?WBC 11.2* 12.2* 12.7* 9.2 8.5  ?HGB 10.6* 10.3* 8.0* 6.4* 8.0*  ?HCT 32.2* 31.6* 25.3* 19.9* 24.7*  ?PLT 163 156 129* 134* 125*  ?MCV 113.4* 113.7* 114.0* 113.7* 106.5*  ?MCH 37.3* 37.1* 36.0* 36.6* 34.5*  ?MCHC 32.9 32.6 31.6 32.2 32.4  ?RDW 13.5 13.5 13.2 13.4 17.2*  ?LYMPHSABS 0.9  --   --   --   --   ?MONOABS 1.0  --   --   --   --   ?EOSABS 0.0  --   --    --   --   ?BASOSABS 0.0  --   --   --   --   ? ? ? ?Recent Labs  ?Lab 10/22/21 ?2147 10/23/21 ?5093 10/23/21 ?1441 10/24/21 ?0158 10/25/21 ?2671 10/26/21 ?0205  ?NA 136 135 135  --   --  136  ?K 4.8 4.5 4.1  --   --  3.8  ?CL 103 104 105  --   --  106  ?CO2 '25 24 24  '$ --   --  24  ?GLUCOSE 197* 140* 113*  --   --  124*  ?BUN '17 14 10  '$ --   --  24*  ?CREATININE 1.01 0.86 0.84  --   --  1.03  ?CALCIUM 9.3 9.1 9.1  --   --  8.2*  ?AST 30  --   --   --   --   --   ?ALT 11  --   --   --   --   --   ?  ALKPHOS 42  --   --   --   --   --   ?BILITOT 0.9  --   --   --   --   --   ?ALBUMIN 3.2*  --   --   --   --   --   ?INR 1.8*  --   --  1.7* 1.4* 1.5*  ? ? ? ?------------------------------------------------------------------------------------------------------------------ ?No results for input(s): CHOL, HDL, LDLCALC, TRIG, CHOLHDL, LDLDIRECT in the last 72 hours. ? ?No results found for: HGBA1C ?------------------------------------------------------------------------------------------------------------------ ?No results for input(s): TSH, T4TOTAL, T3FREE, THYROIDAB in the last 72 hours. ? ?Invalid input(s): FREET3 ? ?Cardiac Enzymes ?No results for input(s): CKMB, TROPONINI, MYOGLOBIN in the last 168 hours. ? ?Invalid input(s): CK ?------------------------------------------------------------------------------------------------------------------ ?No results found for: BNP ? ?CBG: ?No results for input(s): GLUCAP in the last 168 hours. ? ?Recent Results (from the past 240 hour(s))  ?Surgical pcr screen     Status: Abnormal  ? Collection Time: 10/23/21 10:37 AM  ? Specimen: Nasal Mucosa; Nasal Swab  ?Result Value Ref Range Status  ? MRSA, PCR POSITIVE (A) NEGATIVE Final  ? Staphylococcus aureus POSITIVE (A) NEGATIVE Final  ?  Comment: RESULTS CALLED TO,READ BACK BY AND VERIFIED WITH RN T.ISAACS '@1402'$  ON 10/23/21 BY NM ?(NOTE) ?The Xpert SA Assay (FDA approved for NASAL specimens in patients 24 ?years of age and older), is one  component of a comprehensive ?surveillance program. It is not intended to diagnose infection nor to ?guide or monitor treatment. ?Performed at [facility], [place] [address] [place], [place] ?[address] ?  ? ?  ?

## 2021-10-26 NOTE — TOC Initial Note (Addendum)
Transition of Care (TOC) - Initial/Assessment Note  ? ? ?Patient Details  ?Name: Craig Snow ?MRN: [id number] ?Date of Birth: [birth year] ? ?Transition of Care [facility]) CM/SW Contact:    ?Joanne Chars, LCSW ?Phone Number: ?10/26/2021, 10:04 AM ? ?Clinical Narrative:    CSW spoke with pt son [name].  Pt moved to [place] in January and has been hospitalized and at [facility] pretty much the entire time.  Pt DC from [facility] last Wednesday, went home with [name], and had the fall leading to current admission.  [name] confirms that they would like pt to return to [facility] but has questions regarding how the finances would work, they were told pt had "run out of medicare benefit" prior to the recent DC from [facility].  CSW will ask [facility] to reach out and discuss this with [name].  Pt is vaccinated for covid with 2-3 boosters.               ? ?1430: CSW spoke with son [name], he was able to talk with [facility], pt does have 29 medicare days left, all set from the financial end.   ? ?Expected Discharge Plan: [place] ?Barriers to Discharge: Continued Medical Work up ? ? ?Patient Goals and CMS Choice ?  ?  ?Choice offered to / list presented to : Adult Children (son [name]) ? ?Expected Discharge Plan and Services ?Expected Discharge Plan: [place] ?  ?  ?Post Acute Care Choice: [place] ?Living arrangements for the past 2 months: [place] ?                ?  ?  ?  ?  ?  ?  ?  ?  ?  ?  ? ?Prior Living Arrangements/Services ?Living arrangements for the past 2 months: [facility] ?Lives with:: Adult Children ?Patient language and need for interpreter reviewed:: No ?       ?Need for Family Participation in Patient Care: Yes (Comment) ?Care giver support system in place?: Yes (comment) ?Current home services: Other (comment) (none) ?Criminal Activity/Legal Involvement Pertinent to Current Situation/Hospitalization: No - Comment as  needed ? ?Activities of Daily Living ?Home Assistive Devices/Equipment: Wheelchair, Environmental consultant (specify type) ?ADL Screening (condition at time of admission) ?Patient's cognitive ability adequate to safely complete daily activities?: Yes ?Is the patient deaf or have difficulty hearing?: Yes ?Does the patient have difficulty seeing, even when wearing glasses/contacts?: No ?Does the patient have difficulty concentrating, remembering, or making decisions?: No ?Patient able to express need for assistance with ADLs?: Yes ?Does the patient have difficulty dressing or bathing?: Yes ?Independently performs ADLs?: No ?Communication: Independent ?Dressing (OT): Needs assistance ?Is this a change from baseline?: Pre-admission baseline ?Grooming: Needs assistance ?Is this a change from baseline?: Pre-admission baseline ?Feeding: Needs assistance ?Is this a change from baseline?: Pre-admission baseline ?Bathing: Needs assistance ?Is this a change from baseline?: Pre-admission baseline ?Toileting: Needs assistance ?Is this a change from baseline?: Pre-admission baseline ?In/Out Bed: Needs assistance ?Is this a change from baseline?: Pre-admission baseline ?Walks in Home: Needs assistance ?Is this a change from baseline?: Pre-admission baseline ?Does the patient have difficulty walking or climbing stairs?: Yes ?Weakness of Legs: Both ?Weakness of Arms/Hands: None ? ?Permission Sought/Granted ?  ?  ?   ?   ?   ?   ? ?Emotional Assessment ?Appearance:: Appears stated age ?  ?  ?  ?  ?  ? ?Admission diagnosis:  Femoral neck fracture ([place]) [S72.009A] ?Closed fracture  of neck of left femur, initial encounter ([place]) [S72.002A] ?Elbow laceration, left, initial encounter [S51.012A] ?Patient Active Problem List  ? Diagnosis Date Noted  ? Femoral neck fracture ([place]) 10/22/2021  ? HLD (hyperlipidemia) 10/22/2021  ? HTN (hypertension) 10/22/2021  ? Hypothyroidism 10/22/2021  ? Stage 1 skin ulcer of sacral region ([place]) 07/30/2021  ? History of  bladder cancer 07/30/2021  ? Atrial fibrillation ([place]) 07/29/2021  ? ?PCP:  Pcp, No ?Pharmacy:   ?[facility] ?[place] [address] ?Phone: [phone] Fax: [phone] ? ? ? ? ?Social Determinants of Health (SDOH) Interventions ?  ? ?Readmission Risk Interventions ?   ? View : No data to display.  ?  ?  ?  ? ? ? ?

## 2021-10-26 NOTE — Progress Notes (Signed)
Mobility Specialist Progress Note  ? ? 10/26/21 1648  ?Mobility  ?Activity Transferred to/from [facility]  ?Level of Assistance Minimal assist, patient does 75% or more  ?Assistive Device Front wheel walker  ?Activity Response Tolerated well  ?$Mobility charge 1 Mobility  ? ?Pt received in chair and agreeable to get to bed. Upon pivoting pt felt the urge to have a BM. Was successful and transferred to [facility]. Left with NT present for pericare and bath.  ? ?Hildred Alamin ?Mobility Specialist  ?Primary: 5N M.S. Phone: [phone] ?Secondary: 6N M.S. Phone: [phone] ?  ?

## 2021-10-27 DIAGNOSIS — I480 Paroxysmal atrial fibrillation: Secondary | ICD-10-CM | POA: Diagnosis not present

## 2021-10-27 DIAGNOSIS — S72002A Fracture of unspecified part of neck of left femur, initial encounter for closed fracture: Secondary | ICD-10-CM | POA: Diagnosis not present

## 2021-10-27 DIAGNOSIS — Z8551 Personal history of malignant neoplasm of bladder: Secondary | ICD-10-CM | POA: Diagnosis not present

## 2021-10-27 LAB — PROTIME-INR
INR: 2.1 — ABNORMAL HIGH (ref 0.8–1.2)
Prothrombin Time: 23.1 seconds — ABNORMAL HIGH (ref 11.4–15.2)

## 2021-10-27 LAB — CBC
HCT: 24.6 % — ABNORMAL LOW (ref 39.0–52.0)
Hemoglobin: 8.3 g/dL — ABNORMAL LOW (ref 13.0–17.0)
MCH: 35.6 pg — ABNORMAL HIGH (ref 26.0–34.0)
MCHC: 33.7 g/dL (ref 30.0–36.0)
MCV: 105.6 fL — ABNORMAL HIGH (ref 80.0–100.0)
Platelets: 122 10*3/uL — ABNORMAL LOW (ref 150–400)
RBC: 2.33 MIL/uL — ABNORMAL LOW (ref 4.22–5.81)
RDW: 16.7 % — ABNORMAL HIGH (ref 11.5–15.5)
WBC: 8.1 10*3/uL (ref 4.0–10.5)
nRBC: 0 % (ref 0.0–0.2)

## 2021-10-27 MED ORDER — VITAMIN D (ERGOCALCIFEROL) 1.25 MG (50000 UNIT) PO CAPS: 50000.0000 [IU] | ORAL_CAPSULE | ORAL | Status: DC

## 2021-10-27 MED ORDER — WARFARIN SODIUM 3 MG PO TABS
3.0000 mg | ORAL_TABLET | Freq: Once | ORAL | Status: DC
  Filled 2021-10-27: qty 1

## 2021-10-27 NOTE — Plan of Care (Signed)
Stable during shift. Pain well controlled, tolerating diet. Assisting with movement in bed. Chronic foley in place. Alert and interactive, no delirium.  ? ? ?Problem: Education: ?Goal: Knowledge of General Education information will improve ?Description: Including pain rating scale, medication(s)/side effects and non-pharmacologic comfort measures ?Outcome: Progressing ?  ?Problem: Health Behavior/Discharge Planning: ?Goal: Ability to manage health-related needs will improve ?Outcome: Progressing ?  ?Problem: Clinical Measurements: ?Goal: Ability to maintain clinical measurements within normal limits will improve ?Outcome: Progressing ?Goal: Will remain free from infection ?Outcome: Progressing ?Goal: Respiratory complications will improve ?Outcome: Progressing ?Goal: Cardiovascular complication will be avoided ?Outcome: Progressing ?  ?Problem: Nutrition: ?Goal: Adequate nutrition will be maintained ?Outcome: Progressing ?  ?Problem: Coping: ?Goal: Level of anxiety will decrease ?Outcome: Progressing ?  ?Problem: Elimination: ?Goal: Will not experience complications related to bowel motility ?Outcome: Progressing ?Goal: Will not experience complications related to urinary retention ?Outcome: Progressing ?  ?Problem: Pain Managment: ?Goal: General experience of comfort will improve ?Outcome: Progressing ?  ?Problem: Safety: ?Goal: Ability to remain free from injury will improve ?Outcome: Progressing ?  ?Problem: Skin Integrity: ?Goal: Risk for impaired skin integrity will decrease ?Outcome: Progressing ?  ?

## 2021-10-27 NOTE — Progress Notes (Signed)
Occupational Therapy Treatment ?Patient Details ?Name: Craig Snow ?MRN: [id number] ?DOB: 11-Jun-1921 ?Today's Date: 10/27/2021 ? ? ?History of present illness Pt is a 88 y.o. male with history of right hip hemiarthroplasty who was recently discharged from skilled nursing facility to home presented 4/21 after a fall; s/p left hip hemiarthroplasty by orthopedics on 4/22. PMH significant for bladder cancer and chronic urinary retention, a-fib, HTN, and HLD, ?  ?OT comments ? Assisted pt from bed to [facility], in attempt to have BM, and back to bed with min assist and RW. Pt completed grooming with supervision at EOB. Returned to supine at end of session. Pt anticipating discharge to SNF shortly.   ? ?Recommendations for follow up therapy are one component of a multi-disciplinary discharge planning process, led by the attending physician.  Recommendations may be updated based on patient status, additional functional criteria and insurance authorization. ?   ?Follow Up Recommendations ? Skilled nursing-short term rehab (<3 hours/day)  ?  ?Assistance Recommended at Discharge Intermittent Supervision/Assistance  ?Patient can return home with the following ? A lot of help with walking and/or transfers;A lot of help with bathing/dressing/bathroom;Assistance with cooking/housework;Assist for transportation;Help with stairs or ramp for entrance ?  ?Equipment Recommendations ? Other (comment) (defer to next venue)  ?  ?Recommendations for Other Services   ? ?  ?Precautions / Restrictions Precautions ?Precautions: Fall ?Restrictions ?Weight Bearing Restrictions: Yes ?LLE Weight Bearing: Weight bearing as tolerated  ? ? ?  ? ?Mobility Bed Mobility ?Overal bed mobility: Needs Assistance ?Bed Mobility: Supine to Sit, Sit to Supine ?  ?  ?Supine to sit: Min assist ?Sit to supine: Min assist ?  ?General bed mobility comments: increased time, assist to raise trunk and for L LE back into bed ?  ? ?Transfers ?Overall transfer level: Needs  assistance ?Equipment used: Rolling walker (2 wheels) ?Transfers: Sit to/from Stand, Bed to chair/wheelchair/BSC ?Sit to Stand: Min assist ?Stand pivot transfers: Min assist ?  ?  ?  ?  ?General transfer comment: min assist to rise and steady, posterior bias ?  ?  ?Balance Overall balance assessment: Needs assistance ?  ?Sitting balance-Leahy Scale: Fair ?Sitting balance - Comments: completed grooming seated EOB ?  ?Standing balance support: Bilateral upper extremity supported ?Standing balance-Leahy Scale: Poor ?  ?  ?  ?  ?  ?  ?  ?  ?  ?  ?  ?  ?   ? ?ADL either performed or assessed with clinical judgement  ? ?ADL Overall ADL's : Needs assistance/impaired ?Eating/Feeding: Set up;Sitting ?Eating/Feeding Details (indicate cue type and reason): drank with juice thickened for him ?Grooming: Oral care;Sitting;Supervision/safety ?  ?  ?  ?  ?  ?  ?  ?  ?  ?Toilet Transfer: Minimal assistance;Stand-pivot;BSC/3in1;Rolling walker (2 wheels) ?  ?Toileting- Clothing Manipulation and Hygiene: Minimal assistance;Sitting/lateral lean ?  ?  ?  ?  ?  ?  ? ?Extremity/Trunk Assessment   ?  ?  ?  ?  ?  ? ?Vision   ?  ?  ?Perception   ?  ?Praxis   ?  ? ?Cognition Arousal/Alertness: Awake/alert ?Behavior During Therapy: [facility] for tasks assessed/performed ?Overall Cognitive Status: No family/caregiver present to determine baseline cognitive functioning ?Area of Impairment: Safety/judgement ?  ?  ?  ?  ?  ?  ?  ?  ?  ?  ?  ?  ?Safety/Judgement: Decreased awareness of safety ?  ?Problem Solving: Requires verbal cues, Requires tactile cues ?General Comments:  difficult to accurately assess, pt very HOH ?  ?  ?   ?Exercises   ? ?  ?Shoulder Instructions   ? ? ?  ?General Comments    ? ? ?Pertinent Vitals/ Pain       Pain Assessment ?Pain Assessment: Faces ?Faces Pain Scale: Hurts little more ?Pain Location: Left hip, ankle, and heel ?Pain Descriptors / Indicators: Guarding, Discomfort, Sore ?Pain Intervention(s): Monitored during session,  Repositioned ? ?Home Living   ?  ?  ?  ?  ?  ?  ?  ?  ?  ?  ?  ?  ?  ?  ?  ?  ?  ?  ? ?  ?Prior Functioning/Environment    ?  ?  ?  ?   ? ?Frequency ? Min 2X/week  ? ? ? ? ?  ?Progress Toward Goals ? ?OT Goals(current goals can now be found in the care plan section) ? Progress towards OT goals: Progressing toward goals ? ?Acute Rehab OT Goals ?OT Goal Formulation: With patient ?Time For Goal Achievement: 11/07/21 ?Potential to Achieve Goals: Good  ?Plan Discharge plan remains appropriate   ? ?Co-evaluation ? ? ?   ?  ?  ?  ?  ? ?  ?AM-PAC OT "6 Clicks" Daily Activity     ?Outcome Measure ? ? Help from another person eating meals?: A Little ?Help from another person taking care of personal grooming?: A Little ?Help from another person toileting, which includes using toliet, bedpan, or urinal?: A Lot ?Help from another person bathing (including washing, rinsing, drying)?: A Lot ?Help from another person to put on and taking off regular upper body clothing?: A Little ?Help from another person to put on and taking off regular lower body clothing?: Total ?6 Click Score: 14 ? ?  ?End of Session Equipment Utilized During Treatment: Gait belt;Rolling walker (2 wheels) ? ?OT Visit Diagnosis: Unsteadiness on feet (R26.81);Other abnormalities of gait and mobility (R26.89);Muscle weakness (generalized) (M62.81);History of falling (Z91.81) ?  ?Activity Tolerance Patient tolerated treatment well ?  ?Patient Left in bed;with call bell/phone within reach;with bed alarm set ?  ?Nurse Communication   ?  ? ?   ? ?Time: 3220-2542 ?OT Time Calculation (min): 22 min ? ?Charges: OT General Charges ?$OT Visit: 1 Visit ?OT Treatments ?$Self Care/Home Management : 8-22 mins ? ?Nestor Lewandowsky, OTR/L ?Acute Rehabilitation Services ?Pager: [phone] ?Office: [phone]  ?Malka So ?10/27/2021, 12:00 PM ?

## 2021-10-27 NOTE — Progress Notes (Signed)
Phone report called to [phone] [facility]. Spoke with Medula, staff member receiving him. Awaiting PTAR for transport. ?

## 2021-10-27 NOTE — Progress Notes (Signed)
ANTICOAGULATION CONSULT NOTE ? ?Pharmacy Consult for Warfarin ?Indication: atrial fibrillation ? ?Allergies  ?Allergen Reactions  ? Aspirin Other (See Comments)  ?  Unknown reaction - listed on [facility] 07/30/21  ? Clindamycin/Lincomycin Other (See Comments)  ?  Unknown reaction - listed on [facility] 07/30/21  ? Other Other (See Comments)  ?  Unknown reaction to opioids - listed on [facility] 07/30/21  ? Sulfa Antibiotics Other (See Comments)  ?  Unknown reaction - listed on [facility] 07/30/21  ? ?Patient Measurements: ?Height: '5\' 11"'$  (180.3 cm) ?Weight: 65.8 kg (145 lb 1 oz) ?IBW/kg (Calculated) : 75.3 ? ?Vital Signs: ?Temp: 97.5 ?F (36.4 ?C) (04/26 7939) ?BP: 119/66 (04/26 0514) ?Pulse Rate: 83 (04/26 0514) ? ?Labs: ?Recent Labs  ?  10/25/21 ?0300 10/26/21 ?0205 10/27/21 ?0205  ?HGB 6.4* 8.0* 8.3*  ?HCT 19.9* 24.7* 24.6*  ?PLT 134* 125* 122*  ?LABPROT 17.0* 18.4* 23.1*  ?INR 1.4* 1.5* 2.1*  ?CREATININE  --  1.03  --   ? ? ? ?Estimated Creatinine Clearance: 37.3 mL/min (by C-G formula based on SCr of 1.03 mg/dL). ? ? ?PTA Anticoagulation Regimen:  ?2 mg Sun/Tues ?3 mg Mon/Wed/Thurs/Fri/Sat  ? ?Assessment: ?76 YOM presenting with fall resulting in hip fracture s/p repair 4/22 (EBL -254m). Patient is on warfarin PTA for atrial fibrillation. He received 10 mg IV Vitamin K 4/22 AM. Pharmacy has been consulted for warfarin dosing.  ?  ?INR now low end therapeutic at 2.1, decreased drain output to 527m H/H low but stable last 48h, plts 122 ? ?Goal of Therapy:  ?INR 2-3 ?Monitor platelets by anticoagulation protocol: Yes ?  ?Plan:  ?Warfarin '3mg'$  PO x 1 today ?D/c enoxaparin DVT ppx with therapeutic INR ?Daily INR, s/s bleeding ? ?JoBertis RuddyPharmD ?Clinical Pharmacist ?ED Pharmacist Phone # [phone]/26/2023 7:22 AM ? ? ? ? ? ? ?

## 2021-10-27 NOTE — Discharge Summary (Signed)
? ?Physician Discharge Summary  ?Craig Snow YNW:[id number] DOB: 07-20-1920 DOA: 10/22/2021 ? ?PCP: Pcp, No ? ?Admit date: 10/22/2021 ?Discharge date: 10/27/2021 ? ?Admitted From: home ?Disposition:  SNF ? ?Recommendations for Outpatient Follow-up:  ?Follow up with PCP in 1-2 weeks ?Please obtain BMP/CBC in one week ?Follow-up with Dr. Lyla Glassing in 2 weeks ? ?Home Health: none ?Equipment/Devices: none ? ?Discharge Condition: stable ?CODE STATUS: DNR ?Diet recommendation: regular ? ?HPI: Per admitting MD, ?Craig Snow is a 88 y.o. male with medical history significant of A-fib, bladder cancer, hypothyroidism, hyperlipidemia, hypertension presenting after fall at home. Patient recently discharged from rehab following separate admission.  He fell while walking with his walker which appears to have come out from under him.  He fell on his left side including his left elbow, hip and also reported hitting his head. He denies fevers, chills, chest pain, shortness of breath, abdominal pain, constipation, diarrhea, nausea, vomiting. ? ?Hospital Course / Discharge diagnoses: ?Principal Problem: ?  Femoral neck fracture ([place]) ?Active Problems: ?  Atrial fibrillation ([place]) ?  History of bladder cancer ?  HLD (hyperlipidemia) ?  HTN (hypertension) ?  Hypothyroidism ? ? ?Principal problem ?Left femoral neck fracture-patient was admitted to the hospital after a fall while walking with his walker.  Imaging on admission showed left humeral fracture with varus angulation.  Orthopedic surgery consulted, he is status post left hip arthroplasty and also has a drain in place.  On DVT prophylaxis with Coumadin, he was bridged with Lovenox, INR on discharge is 2.1.  Drain at the surgical site is to remain in place until he sees Dr. Lyla Glassing in 2 weeks. ?  ?Active problems ?Acute blood loss anemia-hemoglobin decreased to 6 on 4/24, status post unit of packed red blood cells.  Hemoglobin improved appropriately and has remained stable.  No  bleeding.  ?Paroxysmal A-fib-on rate control with metoprolol, anticoagulated with Coumadin  ?Hypothyroidism-continue home regimen ?Essential hypertension-continue metoprolol, furosemide was held in the hospital due to soft blood pressure, continue to hold on discharge.  Monitor blood pressure and monitor fluid status, consider resuming furosemide in 3 to 4 days, and if so, resume potassium as well  ?Hyperlipidemia-continue Lipitor ?History of bladder cancer-chronic Foley, home regimen ?Leukocytosis-likely reactive, resolved ?Delirium-acutely confused 4/23.  Resolved ?Vitamin D deficiency-continue replacement as an outpatient ? ?Sepsis ruled out ? ? ?Discharge Instructions ? ? ?Allergies as of 10/27/2021   ? ?   Reactions  ? Aspirin Other (See Comments)  ? Unknown reaction - listed on [facility] 07/30/21  ? Clindamycin/lincomycin Other (See Comments)  ? Unknown reaction - listed on [facility] 07/30/21  ? Other Other (See Comments)  ? Unknown reaction to opioids - listed on [facility] 07/30/21  ? Sulfa Antibiotics Other (See Comments)  ? Unknown reaction - listed on [facility] 07/30/21  ? ?  ? ?  ?Medication List  ?  ? ?STOP taking these medications   ? ?furosemide 20 MG tablet ?Commonly known as: LASIX ?  ?potassium chloride 10 MEQ tablet ?Commonly known as: KLOR-CON M ?  ?traMADol 50 MG tablet ?Commonly known as: ULTRAM ?  ? ?  ? ?TAKE these medications   ? ?acetaminophen 500 MG tablet ?Commonly known as: TYLENOL ?Take 1,000 mg by mouth every 6 (six) hours as needed (pain). ?  ?atorvastatin 10 MG tablet ?Commonly known as: LIPITOR ?Take 10 mg by mouth at bedtime. ?  ?COLACE PO ?Take 2 capsules by mouth daily as needed (constipation). ?  ?HYDROcodone-acetaminophen 5-325 MG tablet ?Commonly known as:  NORCO/VICODIN ?Take 1 tablet by mouth every 4 (four) hours as needed for up to 7 days for moderate pain or severe pain. ?  ?levothyroxine 50 MCG tablet ?Commonly known as: SYNTHROID ?Take 50 mcg by mouth every morning. ?  ?liothyronine 5 MCG  tablet ?Commonly known as: CYTOMEL ?Take 5 mcg by mouth every morning. ?  ?Melatonin 10 MG Tabs ?Take 10 mg by mouth at bedtime as needed (sleep). ?  ?metoprolol tartrate 25 MG tablet ?Commonly known as: LOPRESSOR ?Take 1 tablet (25 mg total) by mouth 2 (two) times daily. ?  ?mirabegron ER 25 MG Tb24 tablet ?Commonly known as: MYRBETRIQ ?Take 1 tablet (25 mg total) by mouth daily. ?  ?mirtazapine 15 MG tablet ?Commonly known as: REMERON ?Take 15 mg by mouth at bedtime. ?  ?pantoprazole 40 MG tablet ?Commonly known as: PROTONIX ?Take 40 mg by mouth every morning. ?  ?phenazopyridine 200 MG tablet ?Commonly known as: PYRIDIUM ?Take 200 mg by mouth every 8 (eight) hours as needed for pain. ?  ?polyethylene glycol 17 g packet ?Commonly known as: MIRALAX / GLYCOLAX ?Take 17 g by mouth every morning. ?  ?tamsulosin 0.4 MG Caps capsule ?Commonly known as: FLOMAX ?Take 0.4 mg by mouth every morning. ?  ?Vitamin D (Ergocalciferol) 1.25 MG (50000 UNIT) Caps capsule ?Commonly known as: DRISDOL ?Take 1 capsule (50,000 Units total) by mouth every Tuesday. ?Start taking on: Nov 02, 2021 ?  ?warfarin 2 MG tablet ?Commonly known as: COUMADIN ?Take 2 mg by mouth See admin instructions. Take one tablet (2 mg) by mouth in the morning on Sunday and Tuesday morning (take 3 mg on Monday, Wednesday, Thursday, Friday) ?  ?warfarin 3 MG tablet ?Commonly known as: COUMADIN ?Take 3 mg by mouth See admin instructions. Take one tablet (3 mg) by mouth on Monday, Wednesday, Thursday, Friday morning (take 2 mg on Sunday and Tuesday) ?  ? ?  ? ? Follow-up Information   ? ? Swinteck, Aaron Edelman, MD. Schedule an appointment as soon as possible for a visit in 2 week(s).   ?Specialty: Orthopedic Surgery ?Why: For wound re-check, For suture removal, JP drain removal ?Contact information: ?[address] ?[address] ?[place] [address] ?[phone] ? ? ?  ?  ? ?  ?  ? ?  ? ? ?Consultations: ?Orthopedic surgery ? ?Procedures/Studies: ? ?DG Elbow Complete  Left ? ?Result Date: 10/22/2021 ?CLINICAL DATA:  Fall EXAM: LEFT ELBOW - COMPLETE 3+ VIEW COMPARISON:  None. FINDINGS: No acute bony abnormality. Specifically, no fracture, subluxation, or dislocation. No joint effusion. Soft tissues are intact. IMPRESSION: Negative. Electronically Signed   By: Rolm Baptise M.D.   On: 10/22/2021 22:22  ? ?CT Head Wo Contrast ? ?Result Date: 10/22/2021 ?CLINICAL DATA:  Fall.  Head trauma, minor (Age >= 65y) EXAM: CT HEAD WITHOUT CONTRAST TECHNIQUE: Contiguous axial images were obtained from the base of the skull through the vertex without intravenous contrast. RADIATION DOSE REDUCTION: This exam was performed according to the departmental dose-optimization program which includes automated exposure control, adjustment of the mA and/or kV according to patient size and/or use of iterative reconstruction technique. COMPARISON:  None. FINDINGS: Brain: There is atrophy and chronic small vessel disease changes. No acute intracranial abnormality. Specifically, no hemorrhage, hydrocephalus, mass lesion, acute infarction, or significant intracranial injury. Vascular: No hyperdense vessel or unexpected calcification. Skull: No acute calvarial abnormality. Sinuses/Orbits: No acute findings. Postoperative changes in the left paranasal sinuses. Other: None IMPRESSION: Atrophy, chronic microvascular disease. No acute intracranial abnormality. Electronically  Signed   By: Rolm Baptise M.D.   On: 10/22/2021 22:09  ? ?CT Cervical Spine Wo Contrast ? ?Result Date: 10/22/2021 ?CLINICAL DATA:  Neck trauma (Age >= 65y).  Fall. EXAM: CT CERVICAL SPINE WITHOUT CONTRAST TECHNIQUE: Multidetector CT imaging of the cervical spine was performed without intravenous contrast. Multiplanar CT image reconstructions were also generated. RADIATION DOSE REDUCTION: This exam was performed according to the departmental dose-optimization program which includes automated exposure control, adjustment of the mA and/or kV  according to patient size and/or use of iterative reconstruction technique. COMPARISON:  None. FINDINGS: Alignment: Normal Skull base and vertebrae: No acute fracture. No primary bone lesion or focal pathologic process. Soft ti

## 2021-10-27 NOTE — TOC Transition Note (Signed)
Transition of Care (TOC) - CM/SW Discharge Note ? ? ?Patient Details  ?Name: Craig Snow ?MRN: [id number] ?Date of Birth: 03-Oct-1920 ? ?Transition of Care [facility]) CM/SW Contact:  ?Joanne Chars, LCSW ?Phone Number: ?10/27/2021, 11:10 AM ? ? ?Clinical Narrative:   Pt discharging to [facility].  RN call report to [phone]. ? ? ? ?Final next level of care: [place] ?Barriers to Discharge: Barriers Resolved ? ? ?Patient Goals and CMS Choice ?  ?  ?Choice offered to / list presented to : Adult Children (son [name]) ? ?Discharge Placement ?  ?           ?Patient chooses bed at: [facility] ?Patient to be transferred to facility by: [facility] ?Name of family member notified: son [name] ?Patient and family notified of of transfer: 10/27/21 ? ?Discharge Plan and Services ?  ?  ?Post Acute Care Choice: [facility]          ?  ?  ?  ?  ?  ?  ?  ?  ?  ?  ? ?Social Determinants of Health (SDOH) Interventions ?  ? ? ?Readmission Risk Interventions ?   ? View : No data to display.  ?  ?  ?  ? ? ? ? ? ?

## 2021-12-22 ENCOUNTER — Encounter: Payer: Self-pay | Admitting: Podiatry

## 2021-12-22 ENCOUNTER — Ambulatory Visit (INDEPENDENT_AMBULATORY_CARE_PROVIDER_SITE_OTHER): Payer: Medicare Other | Admitting: Podiatry

## 2021-12-22 DIAGNOSIS — M79674 Pain in right toe(s): Secondary | ICD-10-CM

## 2021-12-22 DIAGNOSIS — M79675 Pain in left toe(s): Secondary | ICD-10-CM

## 2021-12-22 DIAGNOSIS — D689 Coagulation defect, unspecified: Secondary | ICD-10-CM

## 2021-12-22 DIAGNOSIS — B351 Tinea unguium: Secondary | ICD-10-CM | POA: Diagnosis not present

## 2021-12-22 NOTE — Addendum Note (Signed)
Addended by: Gardiner Barefoot on: 12/22/2021 08:46 AM   Modules accepted: Level of Service

## 2021-12-22 NOTE — Progress Notes (Signed)
This patient presents to the office with chief complaint of long thick painful nails.  Patient says the nails are painful walking and wearing shoes.  This patient is unable to self treat.  This patient is unable to trim his  nails since she is unable to reach his  nails.  She presents to the office for preventative foot care services.  General Appearance  Alert, conversant and in no acute stress.  Vascular  Dorsalis pedis and posterior tibial  pulses are palpable  bilaterally.  Capillary return is within normal limits  bilaterally. Temperature is within normal limits  bilaterally.  Neurologic  Senn-Weinstein monofilament wire test within normal limits  bilaterally. Muscle power within normal limits bilaterally.  Nails Thick disfigured discolored nails with subungual debris  from hallux to fifth toes bilaterally. No evidence of bacterial infection or drainage bilaterally.  Orthopedic  No limitations of motion  feet .  No crepitus or effusions noted.  No bony pathology or digital deformities noted.  HAV  B/L.  Skin  normotropic skin with no porokeratosis noted bilaterally.  No signs of infections or ulcers noted.     Onychomycosis  Nails  B/L.  Pain in right toes  Pain in left toes  Debridement of nails both feet followed trimming the nails with dremel tool.    RTC 3 months.   Sylvio Weatherall DPM   

## 2022-01-18 ENCOUNTER — Encounter (HOSPITAL_BASED_OUTPATIENT_CLINIC_OR_DEPARTMENT_OTHER): Payer: Medicare Other | Attending: Internal Medicine | Admitting: Internal Medicine

## 2022-01-18 DIAGNOSIS — Z7901 Long term (current) use of anticoagulants: Secondary | ICD-10-CM | POA: Insufficient documentation

## 2022-01-18 DIAGNOSIS — Z923 Personal history of irradiation: Secondary | ICD-10-CM

## 2022-01-18 DIAGNOSIS — C059 Malignant neoplasm of palate, unspecified: Secondary | ICD-10-CM

## 2022-01-18 DIAGNOSIS — E039 Hypothyroidism, unspecified: Secondary | ICD-10-CM | POA: Diagnosis not present

## 2022-01-18 DIAGNOSIS — Z85830 Personal history of malignant neoplasm of bone: Secondary | ICD-10-CM | POA: Insufficient documentation

## 2022-01-18 DIAGNOSIS — C44329 Squamous cell carcinoma of skin of other parts of face: Secondary | ICD-10-CM | POA: Diagnosis not present

## 2022-01-18 DIAGNOSIS — I4891 Unspecified atrial fibrillation: Secondary | ICD-10-CM | POA: Insufficient documentation

## 2022-01-18 NOTE — Progress Notes (Signed)
ANTAWAN, MCHUGH ([id number]) Visit Report for 01/18/2022 Abuse Risk Screen Details Patient Name: Date of Service: Alonna Minium [facility] 01/18/2022 1:45 PM Medical Record Number: [id number] Patient Account Number: [id number] Date of Birth/Sex: Treating RN: 1920/09/04 (88 y.o. [name], [name] Primary Care Azai Gaffin: Lona Kettle Other Clinician: Referring Kelsye Loomer: Treating Kosha Jaquith/Extender: Celesta Aver in Treatment: 0 Abuse Risk Screen Items Answer ABUSE RISK SCREEN: Has anyone close to you tried to hurt or harm you recentlyo No Do you feel uncomfortable with anyone in your familyo No Has anyone forced you do things that you didnt want to doo No Electronic Signature(s) Signed: 01/18/2022 4:03:03 PM By: Rhae Hammock RN Entered By: Rhae Hammock on 01/18/2022 14:24:29 -------------------------------------------------------------------------------- Activities of Daily Living Details Patient Name: Date of Service: Alonna Minium [facility] 01/18/2022 1:45 PM Medical Record Number: [id number] Patient Account Number: [id number] Date of Birth/Sex: Treating RN: 1920-07-18 (88 y.o. [name], [name] Primary Care Naomee Nowland: Lona Kettle Other Clinician: Referring Ashea Winiarski: Treating Tashi Band/Extender: Celesta Aver in Treatment: 0 Activities of Daily Living Items Answer Activities of Daily Living (Please select one for each item) Drive Automobile Not Able T Medications ake Need Assistance Use T elephone Need Assistance Care for Appearance Need Assistance Use T oilet Need Assistance Bath / Shower Need Assistance Dress Self Need Assistance Feed Self Completely Able Walk Need Assistance Get In / Out Bed Need Assistance Housework Need Assistance Prepare Meals Need Assistance Handle Money Need Assistance Shop for Self Need Assistance Electronic Signature(s) Signed: 01/18/2022 4:03:03 PM By: Rhae Hammock RN Entered By: Rhae Hammock on  01/18/2022 14:25:00 -------------------------------------------------------------------------------- Education Screening Details Patient Name: Date of Service: Alonna Minium [facility] 01/18/2022 1:45 PM Medical Record Number: [id number] Patient Account Number: [id number] Date of Birth/Sex: Treating RN: 1920/09/17 (88 y.o. [name], [name] Primary Care Lidiya Reise: Lona Kettle Other Clinician: Referring Kaesha Kirsch: Treating Deshonda Cryderman/Extender: Celesta Aver in Treatment: 0 Primary Learner Assessed: Patient Learning Preferences/Education Level/Primary Language Learning Preference: Explanation, Demonstration, Communication Board, Printed Material Highest Education Level: College or Above Preferred Language: English Cognitive Barrier Language Barrier: No Translator Needed: No Memory Deficit: No Emotional Barrier: No Cultural/Religious Beliefs Affecting Medical Care: No Physical Barrier Impaired Vision: No 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 Hand dexterity: No Knowledge/Comprehension Knowledge Level: High Comprehension Level: High Ability to understand written instructions: High Ability to understand verbal instructions: High Motivation Anxiety Level: Calm Cooperation: Cooperative Education Importance: Denies Need Interest in Health Problems: Asks Questions Perception: Coherent Willingness to Engage in Self-Management High Activities: Readiness to Engage in Self-Management High Activities: Electronic Signature(s) Signed: 01/18/2022 4:03:03 PM By: Rhae Hammock RN Entered By: Rhae Hammock on 01/18/2022 14:25:37 -------------------------------------------------------------------------------- Fall Risk Assessment Details Patient Name: Date of Service: DA Neill Loft [facility] 01/18/2022 1:45 PM Medical Record Number: [id number] Patient Account Number: [id number] Date of Birth/Sex: Treating RN: Oct 01, 1920 (88 y.o. [name], [name] Primary Care  Rosibel Giacobbe: Lona Kettle Other Clinician: Referring Rodderick Holtzer: Treating Lamount Bankson/Extender: Celesta Aver in Treatment: 0 Fall Risk Assessment Items Have you had 2 or more falls in the last 12 monthso 0 Yes Have you had any fall that resulted in injury in the last 12 monthso 0 Yes FALLS RISK SCREEN History of falling - immediate or within 3 months 25 Yes Secondary diagnosis (Do you have 2 or more medical diagnoseso) 0 No Ambulatory aid None/bed rest/wheelchair/nurse 0 No Crutches/cane/walker 0 No Furniture 0 No Intravenous therapy Access/Saline/Heparin Lock 0 No Gait/Transferring Normal/ bed rest/ wheelchair 0 No Weak (short  steps with or without shuffle, stooped but able to lift head while walking, may seek 10 Yes support from furniture) Impaired (short steps with shuffle, may have difficulty arising from chair, head down, impaired 0 No balance) Mental Status Oriented to own ability 0 Yes Electronic Signature(s) Signed: 01/18/2022 4:03:03 PM By: Rhae Hammock RN Entered By: Rhae Hammock on 01/18/2022 14:15:00 -------------------------------------------------------------------------------- Foot Assessment Details Patient Name: Date of Service: Alonna Minium [facility] 01/18/2022 1:45 PM Medical Record Number: [id number] Patient Account Number: [id number] Date of Birth/Sex: Treating RN: Dec 21, 1920 (88 y.o. [name], [name] Primary Care Debbrah Sampedro: Lona Kettle Other Clinician: Referring Chloe Bluett: Treating Lashonne Shull/Extender: Celesta Aver in Treatment: 0 Foot Assessment Items Site Locations + = Sensation present, - = Sensation absent, C = Callus, U = Ulcer R = Redness, W = Warmth, M = Maceration, PU = Pre-ulcerative lesion F = Fissure, S = Swelling, D = Dryness Assessment Right: Left: Other Deformity: No No Prior Foot Ulcer: No No Prior Amputation: No No Charcot Joint: No No Ambulatory Status: Gait: Notes N/A pt. not  diabetic and no LE wounds Electronic Signature(s) Signed: 01/18/2022 4:03:03 PM By: Rhae Hammock RN Entered By: Rhae Hammock on 01/18/2022 14:15:19 -------------------------------------------------------------------------------- Nutrition Risk Screening Details Patient Name: Date of Service: Alonna Minium [facility] 01/18/2022 1:45 PM Medical Record Number: [id number] Patient Account Number: [id number] Date of Birth/Sex: Treating RN: Apr 27, 1921 (88 y.o. [name], [name] Primary Care Natasja Niday: Lona Kettle Other Clinician: Referring Renny Gunnarson: Treating Antone Summons/Extender: Celesta Aver in Treatment: 0 Height (in): 71 Weight (lbs): 135 Body Mass Index (BMI): 18.8 Nutrition Risk Screening Items Score Screening NUTRITION RISK SCREEN: I have an illness or condition that made me change the kind and/or amount of food I eat 0 No I eat fewer than two meals per day 0 No I eat few fruits and vegetables, or milk products 0 No I have three or more drinks of beer, liquor or wine almost every day 0 No I have tooth or mouth problems that make it hard for me to eat 0 No I don't always have enough money to buy the food I need 0 No I eat alone most of the time 0 No I take three or more different prescribed or over-the-counter drugs a day 0 No Without wanting to, I have lost or gained 10 pounds in the last six months 0 No I am not always physically able to shop, cook and/or feed myself 0 No Nutrition Protocols Good Risk Protocol 0 No interventions needed Moderate Risk Protocol High Risk Proctocol Risk Level: Good Risk Score: 0 Electronic Signature(s) Signed: 01/18/2022 4:03:03 PM By: Rhae Hammock RN Entered By: Rhae Hammock on 01/18/2022 14:15:07

## 2022-01-18 NOTE — Progress Notes (Addendum)
Craig Snow, Craig Snow ([id number]) Visit Report for 01/18/2022 Allergy List Details Patient Name: Date of Service: Craig Snow [facility] 01/18/2022 1:45 PM Medical Record Number: [id number] Patient Account Number: [id number] Date of Birth/Sex: Treating RN: 1921/03/19 (88 y.o. Craig Snow Primary Care Craig Snow: Craig Snow Other Clinician: Referring Craig Snow: Treating Craig Snow/Extender: Craig Snow in Treatment: 0 Allergies Active Allergies aspirin Sulfa (Sulfonamide Antibiotics) ibuprofen Allergy Notes Electronic Signature(s) Signed: 01/18/2022 4:03:03 PM By: Rhae Hammock RN Entered By: Rhae Hammock on 01/18/2022 14:14:12 -------------------------------------------------------------------------------- Arrival Information Details Patient Name: Date of Service: Craig Snow HN 01/18/2022 1:45 PM Medical Record Number: [id number] Patient Account Number: [id number] Date of Birth/Sex: Treating RN: March 10, 1921 (88 y.o. Craig Snow, Craig Snow Primary Care Naava Janeway: Craig Snow Other Clinician: Referring Dixie Coppa: Treating Toryn Dewalt/Extender: Craig Snow in Treatment: 0 Visit Information Patient Arrived: Wheel Chair Arrival Time: 14:08 Accompanied By: son Transfer Assistance: Manual Patient Identification Verified: Yes Secondary Verification Process Completed: Yes Patient Requires Transmission-Based Precautions: No Patient Has Alerts: Yes Patient Alerts: Patient on Blood Thinner Electronic Signature(s) Signed: 01/18/2022 4:03:03 PM By: Rhae Hammock RN Entered By: Rhae Hammock on 01/18/2022 14:11:24 -------------------------------------------------------------------------------- Clinic Level of Care Assessment Details Patient Name: Date of Service: Craig Snow [facility] 01/18/2022 1:45 PM Medical Record Number: [id number] Patient Account Number: [id number] Date of Birth/Sex: Treating RN: 1921/05/20 (88 y.o. Craig Snow,  Espy Primary Care Juliet Vasbinder: Craig Snow Other Clinician: Referring Diyari Cherne: Treating Umaima Scholten/Extender: Craig Snow in Treatment: 0 Clinic Level of Care Assessment Items TOOL 4 Quantity Score X- 1 0 Use when only an EandM is performed on FOLLOW-UP visit ASSESSMENTS - Nursing Assessment / Reassessment X- 1 10 Reassessment of Co-morbidities (includes updates in patient status) X- 1 5 Reassessment of Adherence to Treatment Plan ASSESSMENTS - Wound and Skin A ssessment / Reassessment '[]'$  - 0 Simple Wound Assessment / Reassessment - one wound '[]'$  - 0 Complex Wound Assessment / Reassessment - multiple wounds '[]'$  - 0 Dermatologic / Skin Assessment (not related to wound area) ASSESSMENTS - Focused Assessment '[]'$  - 0 Circumferential Edema Measurements - multi extremities '[]'$  - 0 Nutritional Assessment / Counseling / Intervention '[]'$  - 0 Lower Extremity Assessment (monofilament, tuning fork, pulses) '[]'$  - 0 Peripheral Arterial Disease Assessment (using hand held doppler) ASSESSMENTS - Ostomy and/or Continence Assessment and Care '[]'$  - 0 Incontinence Assessment and Management '[]'$  - 0 Ostomy Care Assessment and Management (repouching, etc.) PROCESS - Coordination of Care X - Simple Patient / Family Education for ongoing care 1 15 '[]'$  - 0 Complex (extensive) Patient / Family Education for ongoing care X- 1 10 Staff obtains Programmer, systems, Records, T Results / Process Orders est '[]'$  - 0 Staff telephones HHA, Nursing Homes / Clarify orders / etc '[]'$  - 0 Routine Transfer to another Facility (non-emergent condition) '[]'$  - 0 Routine Hospital Admission (non-emergent condition) X- 1 15 New Admissions / Biomedical engineer / Ordering NPWT Apligraf, etc. , '[]'$  - 0 Emergency Hospital Admission (emergent condition) X- 1 10 Simple Discharge Coordination '[]'$  - 0 Complex (extensive) Discharge Coordination PROCESS - Special Needs '[]'$  - 0 Pediatric / Minor Patient  Management '[]'$  - 0 Isolation Patient Management '[]'$  - 0 Hearing / Language / Visual special needs '[]'$  - 0 Assessment of Community assistance (transportation, D/C planning, etc.) '[]'$  - 0 Additional assistance / Altered mentation '[]'$  - 0 Support Surface(s) Assessment (bed, cushion, seat, etc.) INTERVENTIONS - Wound Cleansing / Measurement '[]'$  - 0 Simple Wound Cleansing - one wound '[]'$  - 0 Complex  Wound Cleansing - multiple wounds '[]'$  - 0 Wound Imaging (photographs - any number of wounds) '[]'$  - 0 Wound Tracing (instead of photographs) '[]'$  - 0 Simple Wound Measurement - one wound '[]'$  - 0 Complex Wound Measurement - multiple wounds INTERVENTIONS - Wound Dressings '[]'$  - 0 Small Wound Dressing one or multiple wounds '[]'$  - 0 Medium Wound Dressing one or multiple wounds '[]'$  - 0 Large Wound Dressing one or multiple wounds '[]'$  - 0 Application of Medications - topical '[]'$  - 0 Application of Medications - injection INTERVENTIONS - Miscellaneous '[]'$  - 0 External ear exam '[]'$  - 0 Specimen Collection (cultures, biopsies, blood, body fluids, etc.) '[]'$  - 0 Specimen(s) / Culture(s) sent or taken to Lab for analysis '[]'$  - 0 Patient Transfer (multiple staff / Civil Service fast streamer / Similar devices) '[]'$  - 0 Simple Staple / Suture removal (25 or less) '[]'$  - 0 Complex Staple / Suture removal (26 or more) '[]'$  - 0 Hypo / Hyperglycemic Management (close monitor of Blood Glucose) '[]'$  - 0 Ankle / Brachial Index (ABI) - do not check if billed separately X- 1 5 Vital Signs Has the patient been seen at the hospital within the last three years: Yes Total Score: 70 Level Of Care: New/Established - Level 2 Electronic Signature(s) Signed: 01/18/2022 4:03:03 PM By: Rhae Hammock RN Entered By: Rhae Hammock on 01/18/2022 15:14:45 -------------------------------------------------------------------------------- Encounter Discharge Information Details Patient Name: Date of Service: Craig Snow HN 01/18/2022 1:45  PM Medical Record Number: [id number] Patient Account Number: [id number] Date of Birth/Sex: Treating RN: Nov 09, 1920 (88 y.o. Craig Snow, Craig Snow Primary Care Orli Degrave: Craig Snow Other Clinician: Referring Jacklin Zwick: Treating Liyana Suniga/Extender: Craig Snow in Treatment: 0 Encounter Discharge Information Items Discharge Condition: Stable Ambulatory Status: Wheelchair Discharge Destination: Home Transportation: Private Auto Accompanied By: son and CAREGIVER Schedule Follow-up Appointment: Yes Clinical Summary of Care: Patient Declined Electronic Signature(s) Signed: 01/18/2022 4:03:03 PM By: Rhae Hammock RN Entered By: Rhae Hammock on 01/18/2022 15:15:24 -------------------------------------------------------------------------------- Lower Extremity Assessment Details Patient Name: Date of Service: Craig Snow HN 01/18/2022 1:45 PM Medical Record Number: [id number] Patient Account Number: [id number] Date of Birth/Sex: Treating RN: June 11, 1921 (88 y.o. Craig Snow Primary Care Savvas Roper: Craig Snow Other Clinician: Referring Latrell Reitan: Treating Roarke Marciano/Extender: Craig Snow in Treatment: 0 Electronic Signature(s) Signed: 01/18/2022 4:03:03 PM By: Rhae Hammock RN Entered By: Rhae Hammock on 01/18/2022 14:15:23 -------------------------------------------------------------------------------- Multi Wound Chart Details Patient Name: Date of Service: Craig Snow HN 01/18/2022 1:45 PM Medical Record Number: [id number] Patient Account Number: [id number] Date of Birth/Sex: Treating RN: August 14, 1920 (88 y.o. Craig Snow, Craig Snow Primary Care Lai Hendriks: Craig Snow Other Clinician: Referring Teresa Lemmerman: Treating Mackey Varricchio/Extender: Craig Snow in Treatment: 0 Vital Signs Height(in): 71 Pulse(bpm): 60 Weight(lbs): 135 Blood Pressure(mmHg): 105/66 Body Mass Index(BMI):  18.8 Temperature(F): 98.3 Respiratory Rate(breaths/min): 17 Wound Assessments Treatment Notes Electronic Signature(s) Signed: 01/18/2022 4:03:03 PM By: Rhae Hammock RN Signed: 01/18/2022 4:15:59 PM By: Kalman Shan DO Previous Signature: 01/18/2022 3:44:08 PM Version By: Kalman Shan DO Entered By: Kalman Shan on 01/18/2022 16:01:23 -------------------------------------------------------------------------------- Multi-Disciplinary Care Plan Details Patient Name: Date of Service: Craig Snow HN 01/18/2022 1:45 PM Medical Record Number: [id number] Patient Account Number: [id number] Date of Birth/Sex: Treating RN: 07/18/1920 (88 y.o. Craig Snow, Craig Snow Primary Care Hasana Alcorta: Craig Snow Other Clinician: Referring Nikolaos Maddocks: Treating Lakisa Lotz/Extender: Craig Snow in Treatment: 0 Active Inactive Electronic Signature(s) Signed: 04/01/2022 11:53:23 AM By: Rhae Hammock RN Previous Signature: 01/18/2022 4:03:03 PM Version By: Rhae Hammock  RN Entered By: Rhae Hammock on 03/01/2022 15:48:37 -------------------------------------------------------------------------------- Pain Assessment Details Patient Name: Date of Service: Craig Snow [facility] 01/18/2022 1:45 PM Medical Record Number: [id number] Patient Account Number: [id number] Date of Birth/Sex: Treating RN: [birth year] (88 y.o. Craig Snow, Craig Snow Primary Care Gearl Kimbrough: Craig Snow Other Clinician: Referring Traevion Poehler: Treating Nyshawn Gowdy/Extender: Craig Snow in Treatment: 0 Active Problems Location of Pain Severity and Description of Pain Patient Has Paino Yes Site Locations Pain Location: Generalized Pain, Pain in Ulcers With Dressing Change: Yes Duration of the Pain. Constant / Intermittento Intermittent Rate the pain. Current Pain Level: 4 Worst Pain Level: 10 Least Pain Level: 0 Tolerable Pain Level: 4 Character of Pain Describe the  Pain: Aching Pain Management and Medication Current Pain Management: Medication: No Cold Application: No Rest: No Massage: No Activity: No T.E.N.S.: No Heat Application: No Leg drop or elevation: No Is the Current Pain Management Adequate: Adequate How does your wound impact your activities of daily livingo Sleep: No Bathing: No Appetite: No Relationship With Others: No Bladder Continence: No Emotions: No Bowel Continence: No Work: No Toileting: No Drive: No Dressing: No Hobbies: No Electronic Signature(s) Signed: 01/18/2022 4:03:03 PM By: Rhae Hammock RN Entered By: Rhae Hammock on 01/18/2022 14:27:30 -------------------------------------------------------------------------------- Patient/Caregiver Education Details Patient Name: Date of Service: Craig Snow HN 7/18/2023andnbsp1:45 PM Medical Record Number: [id number] Patient Account Number: [id number] Date of Birth/Gender: Treating RN: 08-11-1920 (88 y.o. Craig Snow Primary Care Physician: Craig Snow Other Clinician: Referring Physician: Treating Physician/Extender: Craig Snow in Treatment: 0 Education Assessment Education Provided To: Patient Education Topics Provided [place]: o Methods: Explain/Verbal Responses: Reinforcements needed, State content correctly Electronic Signature(s) Signed: 01/18/2022 4:03:03 PM By: Rhae Hammock RN Entered By: Rhae Hammock on 01/18/2022 14:54:01 -------------------------------------------------------------------------------- Vitals Details Patient Name: Date of Service: Craig Snow HN 01/18/2022 1:45 PM Medical Record Number: [id number] Patient Account Number: [id number] Date of Birth/Sex: Treating RN: 02/24/1921 (87 y.o. Craig Snow, Craig Snow Primary Care Albi Rappaport: Craig Snow Other Clinician: Referring Ajanee Buren: Treating Janeya Deyo/Extender: Craig Snow in  Treatment: 0 Vital Signs Time Taken: 14:11 Temperature (F): 98.3 Height (in): 71 Pulse (bpm): 60 Source: Stated Respiratory Rate (breaths/min): 17 Weight (lbs): 135 Blood Pressure (mmHg): 105/66 Source: Stated Reference Range: 80 - 120 mg / dl Body Mass Index (BMI): 18.8 Electronic Signature(s) Signed: 01/18/2022 4:03:03 PM By: Rhae Hammock RN Entered By: Rhae Hammock on 01/18/2022 14:13:15

## 2022-01-18 NOTE — Progress Notes (Signed)
BURNEY, CALZADILLA ([id number]) Visit Report for 01/18/2022 Chief Complaint Document Details Patient Name: Date of Service: Craig Snow [facility] 01/18/2022 1:45 PM Medical Record Number: [id number] Patient Account Number: [id number] Date of Birth/Sex: Treating RN: August 27, 1920 (88 y.o. Craig Snow, Craig Snow Primary Care Provider: Lona Kettle Other Clinician: Referring Provider: Treating Provider/Extender: Celesta Aver in Treatment: 0 Information Obtained from: Patient Chief Complaint 01/18/2022; discussed HBO therapy for osteoradionecrosis Electronic Signature(s) Signed: 01/18/2022 4:15:59 PM By: Kalman Shan DO Previous Signature: 01/18/2022 3:44:08 PM Version By: Kalman Shan DO Entered By: Kalman Shan on 01/18/2022 16:02:13 -------------------------------------------------------------------------------- HPI Details Patient Name: Date of Service: Craig Snow 01/18/2022 1:45 PM Medical Record Number: [id number] Patient Account Number: [id number] Date of Birth/Sex: Treating RN: 1921/03/24 (88 y.o. 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 He subsequently received radiation therapy. He received a total dose of 60 GY in 30 fractions. This was done by Dr. [name] in 2007. He presents today to discuss HBO therapy. He was referred by his dentist, Dr. [name]. Patient is having discomfort and aspirating when eating and Plan is for removal of 17 teeth and smoothing down the bone in the mandible and maxilla. 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 His dentist recommended HBO therapy prior to teeth extraction. Electronic Signature(s) Signed: 01/18/2022 4:15:59 PM By: Kalman Shan DO Previous Signature: 01/18/2022 3:44:08 PM Version By: Kalman Shan DO Entered By: Kalman Shan on 01/18/2022 16:11:45 -------------------------------------------------------------------------------- Physical Exam Details Patient Name: Date of Service: Craig Snow 01/18/2022 1:45 PM Medical Record Number: [id number] Patient Account Number: [id number] Date of Birth/Sex: Treating RN: March 04, 1921 (88 y.o. [name] Primary Care Provider: Other Clinician: Lona Kettle Referring Provider: Treating Provider/Extender: Celesta Aver in Treatment: 0 Constitutional respirations regular, non-labored and within target range for patient.Marland Kitchen Psychiatric pleasant and cooperative. Notes Mouth: Part of hard palate removed. Multiple teeth missing. Electronic Signature(s) Signed: 01/18/2022 4:15:59 PM By: Kalman Shan DO Previous Signature: 01/18/2022 3:44:08 PM Version By: Kalman Shan DO Entered By: Kalman Shan on 01/18/2022 16:11:50 -------------------------------------------------------------------------------- Physician Orders Details Patient Name: Date of Service: Craig Snow 01/18/2022 1:45 PM Medical Record Number: [id number] Patient Account Number: [id number] Date of Birth/Sex: Treating RN: 1921-04-25 (88 y.o. Craig Snow, Craig Snow Primary Care Provider: Lona Kettle Other Clinician: Referring Provider: Treating Provider/Extender: Celesta Aver in Treatment: 0 Verbal / Phone Orders: No Diagnosis Coding ICD-10 Coding Code Description C05.9 Malignant neoplasm of palate, unspecified Z92.3 Personal history of irradiation Follow-up Appointments Other: - We will work on getting all your paperwork together to send to  your insurance to see if HBO can get approved. We will call you once we know something!! Pt. needs to f/u with ENT and Cardiologist Hyperbaric Oxygen Therapy Evaluate for HBO Therapy - For osteoradionecrosis of jaw If appropriate for treatment, begin HBOT per protocol: 2.0 ATA for 90 Minutes with 2 Five (5) Minute A Breaks ir Total Number of Treatments: - 30; 20 dives pretreatment and 10 postsurgical dives One treatments per day (delivered Monday through Friday unless otherwise specified in Special Instructions below): Finger stick Blood  Glucose Pre- and Post- HBOT Treatment. Follow Hyperbaric Oxygen Glycemia Protocol A frin (Oxymetazoline HCL) 0.05% nasal spray - 1 spray in both nostrils daily as needed prior to HBO treatment for difficulty clearing ears GLYCEMIA INTERVENTIONS PROTOCOL PRE-HBO GLYCEMIA INTERVENTIONS ACTION INTERVENTION Obtain pre-HBO capillary blood glucose (ensure 1 physician order is in chart). A. Notify HBO physician and await physician orders. 2 If result is 70 mg/dl or below: B. If the result meets the hospital definition of a critical result, follow hospital policy. A. Give patient an 8 ounce Glucerna Shake, an 8 ounce Ensure, or 8 ounces of a Glucerna/Ensure equivalent dietary supplement*. B. Wait 30 minutes. If result is 71 mg/dl to 130 mg/dl: C. Retest patients capillary blood glucose (CBG). D. If result greater than or equal to 110 mg/dl, proceed with HBO. If result less than 110 mg/dl, notify HBO physician and consider holding HBO. If result is 131 mg/dl to 249 mg/dl: A. Proceed with HBO. A. Notify HBO physician and await physician orders. B. It is recommended to hold HBO and do If result is 250 mg/dl or greater: blood/urine ketone testing. C. If the result meets the hospital definition of a critical result, follow hospital policy. POST-HBO GLYCEMIA INTERVENTIONS ACTION INTERVENTION Obtain post HBO capillary blood glucose  (ensure 1 physician order is in chart). A. Notify HBO physician and await physician orders. 2 If result is 70 mg/dl or below: B. If the result meets the hospital definition of a critical result, follow hospital policy. A. Give patient an 8 ounce Glucerna Shake, an 8 ounce Ensure, or 8 ounces of a Glucerna/Ensure equivalent dietary supplement*. B. Wait 15 minutes for symptoms of If result is 71 mg/dl to 100 mg/dl: hypoglycemia (i.e. nervousness, anxiety, sweating, chills, clamminess, irritability, confusion, tachycardia or dizziness). C. If patient asymptomatic, discharge patient. If patient symptomatic, repeat capillary blood glucose (CBG) and notify HBO physician. If result is 101 mg/dl to 249 mg/dl: A. Discharge patient. A. Notify HBO physician and await physician orders. B. It is recommended to do blood/urine ketone If result is 250 mg/dl or greater: testing. C. If the result meets the hospital definition of a critical result, follow hospital policy. *Juice or candies are NOT equivalent products. If patient refuses the Glucerna or Ensure, please consult the hospital dietitian for an appropriate substitute. Electronic Signature(s) Signed: 01/18/2022 4:15:59 PM By: Kalman Shan DO Previous Signature: 01/18/2022 3:44:08 PM Version By: Kalman Shan DO Entered By: Kalman Shan on 01/18/2022 16:11:59 -------------------------------------------------------------------------------- Problem List Details Patient Name: Date of Service: Craig Snow 01/18/2022 1:45 PM Medical Record Number: [id number] Patient Account Number: [id number] Date of Birth/Sex: Treating RN: 27-Dec-1920 (88 y.o. Craig Snow, Craig Snow Primary Care Provider: Lona Kettle Other Clinician: Referring Provider: Treating Provider/Extender: Celesta Aver in Treatment: 0 Active Problems ICD-10 Encounter Code Description Active Date MDM Diagnosis C05.9 Malignant neoplasm of  palate, unspecified 01/18/2022 No Yes C44.329 Squamous cell carcinoma of skin of other parts of face 01/18/2022 No Yes Z92.3 Personal history of irradiation 01/18/2022 No Yes Inactive Problems Resolved Problems Electronic Signature(s) Signed: 01/18/2022 4:15:59 PM By: Kalman Shan DO Previous Signature: 01/18/2022 3:44:08 PM Version By: Kalman Shan DO Entered By: Kalman Shan on 01/18/2022 16:01:09 -------------------------------------------------------------------------------- Progress Note Details Patient Name: Date of Service: Craig Snow 01/18/2022 1:45 PM Medical Record Number: [id number] Patient Account Number: [id number] Date of Birth/Sex: Treating RN: [birth year] (88 y.o. 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 He subsequently received radiation therapy. He received a total dose of 60 GY in 30 fractions. This was done by Dr. [name] in 2007. He presents today to discuss HBO therapy. He was referred by his dentist, Dr. [name]. Patient is having discomfort and aspirating when eating and Plan is for removal of 17 teeth and smoothing down the bone in the mandible and maxilla. 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 His dentist recommended HBO therapy prior to teeth extraction. Patient History Information obtained from Patient, Chart. Allergies aspirin, Sulfa (Sulfonamide  Antibiotics), ibuprofen Family History Unknown History. Social History Former smoker, Marital Status - Widowed, Alcohol Use - Never, Drug Use - No History, Caffeine Use - Moderate. 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 Eyes Denies complaints or symptoms of Dry Eyes, Vision Changes, Glasses / Contacts. Ear/Nose/Mouth/Throat Denies complaints or symptoms of Chronic sinus problems or rhinitis. Respiratory Denies complaints or symptoms of Chronic or frequent coughs, Shortness of Breath. Cardiovascular Denies complaints or symptoms of Chest pain. Gastrointestinal Denies complaints or symptoms of Frequent diarrhea, Nausea, Vomiting. Integumentary (Skin) Denies complaints or symptoms of Wounds. Musculoskeletal Denies complaints or symptoms of Muscle Pain, Muscle Weakness. Neurologic Denies complaints or symptoms of Numbness/parasthesias. Psychiatric Denies complaints or symptoms of Claustrophobia, Suicidal. Objective Constitutional respirations regular, non-labored and within target range for patient.. Vitals Time Taken: 2:11 PM, Height: 71 in, Source: Stated, Weight: 135 lbs, Source: Stated, BMI: 18.8, Temperature: 98.3 F, Pulse: 60 bpm, Respiratory Rate: 17 breaths/min, Blood Pressure: 105/66 mmHg. Psychiatric pleasant and cooperative. General Notes: Mouth: Part of hard palate  removed. Multiple teeth missing. Assessment Active Problems ICD-10 Malignant neoplasm of palate, unspecified Squamous cell carcinoma of skin of other parts of face Personal history of irradiation Patient presents for discussion of HBO therapy. Patient has a history of squamous cell carcinoma to the maxilla, gingiva and hard palate status post surgical resection and radiation. Since patient is having difficulty eating and is aspirating his dentist, Dr [name] is recommending his teeth be extracted. With the history of radiation there is concern for osteoradionecrosis that will complicate healing. We do not have any definitive imaging suggesting osteoradionecrosis. This will need to be obtained prior to insurance approval. The risks and benefits of HBO was discussed with the patient. Plan Follow-up Appointments: Other: - We will work on getting all your paperwork together to send to your insurance to see if HBO can get approved. We will call you once we know something!! Pt. 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 Follow Hyperbaric Oxygen Glycemia Protocol Afrin (Oxymetazoline HCL) 0.05% nasal spray - 1 spray in both nostrils daily as needed prior to HBO treatment for difficulty clearing ears 1. Imaging to confirm osteoradionecrosis 2. Send paperwork for insurance approval of HBO Once all documentation is obtained Electronic Signature(s) Signed: 01/18/2022 4:15:59 PM By: Kalman Shan DO Previous Signature: 01/18/2022 3:44:08 PM Version By: Kalman Shan DO Entered By: Kalman Shan on 01/18/2022 16:15:10 -------------------------------------------------------------------------------- HxROS Details Patient Name: Date of Service: Craig Snow Snow 01/18/2022 1:45 PM Medical Record Number: [id number] Patient Account Number: [id number] Date of Birth/Sex: Treating RN: 11-22-1920 (88 y.o. [name] Primary Care Provider: Lona Kettle Other Clinician: Referring Provider: Treating Provider/Extender: Celesta Aver in Treatment: 0 Information Obtained From Patient Chart Constitutional Symptoms (General Health) Complaints and Symptoms: Negative for: Fatigue; Fever; Chills; Marked Weight Change Eyes Complaints and Symptoms: Negative for: Dry Eyes; Vision Changes; Glasses / Contacts Medical History: Negative for: Cataracts; Glaucoma; Optic Neuritis Ear/Nose/Mouth/Throat Complaints and Symptoms: Negative for: Chronic sinus problems or rhinitis Medical History: Past Medical History Notes: 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, 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 Respiratory Complaints and Symptoms: Negative for: Chronic or frequent coughs; Shortness of Breath Cardiovascular Complaints and Symptoms: Negative for: Chest pain Medical History: Positive for: Arrhythmia - Afibb; Hypertension Past Medical History Notes: hyperlipidemia Gastrointestinal Complaints and Symptoms: Negative for: Frequent diarrhea; Nausea; Vomiting Integumentary (Skin) Complaints and Symptoms: Negative for: Wounds Musculoskeletal Complaints and Symptoms: Negative for: Muscle Pain; Muscle Weakness Neurologic Complaints and Symptoms: Negative for: Numbness/parasthesias Psychiatric Complaints and Symptoms: Negative for: Claustrophobia; Suicidal Hematologic/Lymphatic Medical History: Past Medical History Notes: thyrotoxicosis Endocrine Medical History: Past Medical History Notes: hypothyroidism Genitourinary Medical History: Past Medical  History Notes: hx bladder ca Immunological Oncologic Medical History: Positive for: Received Radiation - x20 yrs ago @ [place] Past Medical History Notes: palatal,bladder; indwelling cath Immunizations Pneumococcal Vaccine: Received Pneumococcal Vaccination: Yes Received Pneumococcal Vaccination On or After 60th Birthday: Yes Implantable Devices None Family and Social History Unknown History: Yes; Former smoker; Marital Status - Widowed; Alcohol Use: Never; Drug Use: No History; Caffeine Use: Moderate; Financial Concerns: No; Food, Clothing or Shelter Needs: No; Support System Lacking: No; Transportation Concerns: No Electronic Signature(s) Signed: 01/18/2022 3:44:08 PM By: Kalman Shan DO Signed: 01/18/2022 4:03:03 PM By: Rhae Hammock RN Entered By: Rhae Hammock on 01/18/2022 14:24:24 -------------------------------------------------------------------------------- SuperBill Details Patient Name: Date of Service: Craig Snow 01/18/2022 Medical Record Number: [id number] Patient Account Number: [id number] Date of Birth/Sex: Treating RN: May 31, 1921 (88 y.o. Craig Snow, Craig Snow Primary Care Provider: Lona Kettle Other Clinician: Referring Provider: Treating Provider/Extender: Celesta Aver in Treatment: 0 Diagnosis Coding ICD-10 Codes Code Description C05.9 Malignant neoplasm of palate, unspecified C44.329 Squamous cell carcinoma of skin of other parts of face Z92.3 Personal history of irradiation Facility Procedures CPT4 Code: [id number] Description: 12 - WOUND CARE VISIT-LEV 2 EST PT 1 Modifier: Quantity: Physician Procedures : CPT4 Code Description Modifier [id number] - WC PHYS LEVEL 4 - NEW PT ICD-10 Diagnosis Description C05.9 Malignant neoplasm of palate, unspecified Z92.3 Personal history of irradiation C44.329 Squamous cell carcinoma of skin of other parts of face Quantity: 1 Electronic Signature(s) Signed: 01/18/2022  4:15:59 PM By: Kalman Shan DO Previous Signature: 01/18/2022 3:44:08 PM  Version By: Kalman Shan DO Entered By: Kalman Shan on 01/18/2022 16:15:37

## 2022-01-28 ENCOUNTER — Other Ambulatory Visit: Payer: Self-pay

## 2022-01-28 ENCOUNTER — Encounter (HOSPITAL_COMMUNITY): Payer: Self-pay | Admitting: Emergency Medicine

## 2022-01-28 ENCOUNTER — Emergency Department (HOSPITAL_COMMUNITY)
Admission: EM | Admit: 2022-01-28 | Discharge: 2022-01-28 | Disposition: A | Discharge status: Home / Self Care | Payer: Medicare Other | Attending: Emergency Medicine | Admitting: Emergency Medicine

## 2022-01-28 ENCOUNTER — Emergency Department (HOSPITAL_COMMUNITY): Payer: Medicare Other

## 2022-01-28 DIAGNOSIS — R109 Unspecified abdominal pain: Secondary | ICD-10-CM | POA: Diagnosis present

## 2022-01-28 DIAGNOSIS — R059 Cough, unspecified: Secondary | ICD-10-CM | POA: Insufficient documentation

## 2022-01-28 DIAGNOSIS — Z7901 Long term (current) use of anticoagulants: Secondary | ICD-10-CM | POA: Insufficient documentation

## 2022-01-28 DIAGNOSIS — R1012 Left upper quadrant pain: Secondary | ICD-10-CM

## 2022-01-28 DIAGNOSIS — Z79899 Other long term (current) drug therapy: Secondary | ICD-10-CM | POA: Insufficient documentation

## 2022-01-28 LAB — COMPREHENSIVE METABOLIC PANEL
ALT: 18 U/L (ref 0–44)
AST: 23 U/L (ref 15–41)
Albumin: 3.7 g/dL (ref 3.5–5.0)
Alkaline Phosphatase: 72 U/L (ref 38–126)
Anion gap: 7 (ref 5–15)
BUN: 33 mg/dL — ABNORMAL HIGH (ref 8–23)
CO2: 27 mmol/L (ref 22–32)
Calcium: 9.7 mg/dL (ref 8.9–10.3)
Chloride: 103 mmol/L (ref 98–111)
Creatinine, Ser: 1.24 mg/dL (ref 0.61–1.24)
GFR, Estimated: 53 mL/min — ABNORMAL LOW (ref >60)
Glucose, Bld: 84 mg/dL (ref 70–99)
Potassium: 4.9 mmol/L (ref 3.5–5.1)
Sodium: 137 mmol/L (ref 135–145)
Total Bilirubin: 0.5 mg/dL (ref 0.3–1.2)
Total Protein: 7 g/dL (ref 6.5–8.1)

## 2022-01-28 LAB — URINALYSIS, ROUTINE W REFLEX MICROSCOPIC
Bilirubin Urine: NEGATIVE
Glucose, UA: NEGATIVE mg/dL
Ketones, ur: NEGATIVE mg/dL
Nitrite: NEGATIVE
Protein, ur: 30 mg/dL — AB
Specific Gravity, Urine: 1.021 (ref 1.005–1.030)
WBC, UA: >50 WBC/hpf — ABNORMAL HIGH (ref 0–5)
pH: 6 (ref 5.0–8.0)

## 2022-01-28 LAB — CBC WITH DIFFERENTIAL/PLATELET
Abs Immature Granulocytes: 0.02 10*3/uL (ref 0.00–0.07)
Basophils Absolute: 0 10*3/uL (ref 0.0–0.1)
Basophils Relative: 0 %
Eosinophils Absolute: 0.1 10*3/uL (ref 0.0–0.5)
Eosinophils Relative: 1 %
HCT: 38.5 % — ABNORMAL LOW (ref 39.0–52.0)
Hemoglobin: 12.4 g/dL — ABNORMAL LOW (ref 13.0–17.0)
Immature Granulocytes: 0 %
Lymphocytes Relative: 32 %
Lymphs Abs: 3 10*3/uL (ref 0.7–4.0)
MCH: 34.3 pg — ABNORMAL HIGH (ref 26.0–34.0)
MCHC: 32.2 g/dL (ref 30.0–36.0)
MCV: 106.6 fL — ABNORMAL HIGH (ref 80.0–100.0)
Monocytes Absolute: 0.8 10*3/uL (ref 0.1–1.0)
Monocytes Relative: 8 %
Neutro Abs: 5.5 10*3/uL (ref 1.7–7.7)
Neutrophils Relative %: 59 %
Platelets: 187 10*3/uL (ref 150–400)
RBC: 3.61 MIL/uL — ABNORMAL LOW (ref 4.22–5.81)
RDW: 13.2 % (ref 11.5–15.5)
WBC: 9.4 10*3/uL (ref 4.0–10.5)
nRBC: 0 % (ref 0.0–0.2)

## 2022-01-28 LAB — LIPASE, BLOOD: Lipase: 25 U/L (ref 11–51)

## 2022-01-28 MED ORDER — ONDANSETRON HCL 4 MG/2ML IJ SOLN
4.0000 mg | Freq: Once | INTRAMUSCULAR | Status: DC
  Filled 2022-01-28: qty 2

## 2022-01-28 MED ORDER — ACETAMINOPHEN 500 MG PO TABS
1000.0000 mg | ORAL_TABLET | Freq: Once | ORAL | Status: AC
  Administered 2022-01-28: 1000 mg via ORAL
  Filled 2022-01-28: qty 2

## 2022-01-28 MED ORDER — IOHEXOL 300 MG/ML  SOLN
100.0000 mL | Freq: Once | INTRAMUSCULAR | Status: AC | PRN
  Administered 2022-01-28: 100 mL via INTRAVENOUS

## 2022-01-28 MED ORDER — MORPHINE SULFATE (PF) 2 MG/ML IV SOLN
1.0000 mg | Freq: Once | INTRAVENOUS | Status: AC
  Administered 2022-01-28: 1 mg via INTRAVENOUS
  Filled 2022-01-28: qty 1

## 2022-01-28 NOTE — ED Triage Notes (Signed)
Pt reports left sided abd pain under rib cage. Denies N/V/D

## 2022-01-28 NOTE — ED Provider Triage Note (Signed)
Emergency Medicine Provider Triage Evaluation Note  Craig Snow , a 88 y.o. male  was evaluated in triage.  Pt complains of left upper quadrant abdominal pain.  Patient is accompanied by son who is co-Historian.  They state that symptoms began this past Sunday.  Symptoms been constant since onset.  Describes the pain as dull and achy.  He notes some radiation to his left back.  Denies any rash.  Denies nausea, vomiting, fever, hematochezia, melena, change in bowel habits, chest pain, shortness of breath, urinary symptoms Review of Systems  Positive: See above Negative:   Physical Exam  BP (!) 113/57 (BP Location: Left Arm)   Pulse (!) 55   Temp 98.1 F (36.7 C) (Oral)   Resp 18   Ht '5\' 11"'$  (1.803 m)   Wt 61.1 kg   SpO2 99%   BMI 18.80 kg/m  Gen:   Awake, no distress   Resp:  Normal effort  MSK:   Moves extremities without difficulty  Other:  Left upper quadrant tenderness on exam.  No overlying skin abnormalities noted.  Medical Decision Making  Medically screening exam initiated at 4:05 PM.  Appropriate orders placed.  Carron Brazen was informed that the remainder of the evaluation will be completed by another provider, this initial triage assessment does not replace that evaluation, and the importance of remaining in the ED until their evaluation is complete.     Wilnette Kales, [place] 01/28/22 [phone]

## 2022-01-28 NOTE — Discharge Instructions (Signed)
The blood work and CT scan did not show an obvious cause for his discomfort.  As we discussed and reviewed the images with you there is a possibility that this could be due to gas.  Please discuss this with your family doctor.  See if they want to change his diet or change in his medicines or have you start some probiotics.  Please return for worsening pain fever or inability to eat or drink.

## 2022-01-28 NOTE — ED Provider Notes (Signed)
[facility] DEPT Provider Note   CSN: [id number] Arrival date & time: 01/28/22  1515     History  Chief Complaint  Patient presents with   Abdominal Pain    Craig Snow is a 88 y.o. male.  88 yo M with a cc of L sided abdominal pain.  Going on for the past 4 days or so.  No nausea vomiting, fevers.  No diarrhea.  Mild cough but chronic and unchanged.    Abdominal Pain      Home Medications Prior to Admission medications   Medication Sig Start Date End Date Taking? Authorizing Provider  acetaminophen (TYLENOL) 500 MG tablet Take 1,000 mg by mouth every 6 (six) hours as needed (pain).    [provider]  atorvastatin (LIPITOR) 10 MG tablet Take 10 mg by mouth at bedtime.    [provider]  Docusate Sodium (COLACE PO) Take 2 capsules by mouth daily as needed (constipation).    [provider]  levothyroxine (SYNTHROID) 50 MCG tablet Take 50 mcg by mouth every morning.    [provider]  liothyronine (CYTOMEL) 5 MCG tablet Take 5 mcg by mouth every morning.    [provider]  Melatonin 10 MG TABS Take 10 mg by mouth at bedtime as needed (sleep).    [provider]  metoprolol tartrate (LOPRESSOR) 25 MG tablet Take 1 tablet (25 mg total) by mouth 2 (two) times daily. 08/06/21   Shawna Clamp, MD  mirabegron ER (MYRBETRIQ) 25 MG TB24 tablet Take 1 tablet (25 mg total) by mouth daily. 08/07/21   Shawna Clamp, MD  mirtazapine (REMERON) 15 MG tablet Take 15 mg by mouth at bedtime.    [provider]  pantoprazole (PROTONIX) 40 MG tablet Take 40 mg by mouth every morning.    [provider]  phenazopyridine (PYRIDIUM) 200 MG tablet Take 200 mg by mouth every 8 (eight) hours as needed for pain. 07/22/21   [provider]  polyethylene glycol (MIRALAX / GLYCOLAX) 17 g packet Take 17 g by mouth every morning.    [provider]  tamsulosin (FLOMAX) 0.4 MG CAPS capsule  Take 0.4 mg by mouth every morning.    [provider]  Vitamin D, Ergocalciferol, (DRISDOL) 1.25 MG (50000 UNIT) CAPS capsule Take 1 capsule (50,000 Units total) by mouth every Tuesday. 11/02/21   Caren Griffins, MD  warfarin (COUMADIN) 2 MG tablet Take 2 mg by mouth See admin instructions. Take one tablet (2 mg) by mouth in the morning on Sunday and Tuesday morning (take 3 mg on Monday, Wednesday, Thursday, Friday)    [provider]  warfarin (COUMADIN) 3 MG tablet Take 3 mg by mouth See admin instructions. Take one tablet (3 mg) by mouth on Monday, Wednesday, Thursday, Friday morning (take 2 mg on Sunday and Tuesday)    [provider]      Allergies    Aspirin, Clindamycin/lincomycin, Other, and Sulfa antibiotics    Review of Systems   Review of Systems  Gastrointestinal:  Positive for abdominal pain.    Physical Exam Updated Vital Signs BP (!) 153/80 (BP Location: Left Arm)   Pulse 65   Temp 97.8 F (36.6 C) (Oral)   Resp 13   Ht '5\' 11"'$  (1.803 m)   Wt 61.1 kg   SpO2 100%   BMI 18.80 kg/m  Physical Exam Vitals and nursing note reviewed.  Constitutional:      Appearance: He is well-developed.  HENT:     Head: Normocephalic and atraumatic.  Eyes:     Pupils: Pupils are equal, round, and reactive to light.  Neck:     Vascular: No JVD.  Cardiovascular:     Rate and Rhythm: Normal rate and regular rhythm.     Heart sounds: No murmur heard.    No friction rub. No gallop.  Pulmonary:     Effort: No respiratory distress.     Breath sounds: No wheezing.  Abdominal:     General: There is no distension.     Tenderness: There is no abdominal tenderness. There is no guarding or rebound.     Comments: No obvious tenderness on abdominal exam.  Points to his left upper abdomen underneath the costal margin.  No obvious rash.  Musculoskeletal:        General: Normal range of motion.     Cervical back: Normal range of motion and neck supple.  Skin:     Coloration: Skin is not pale.     Findings: No rash.  Neurological:     Mental Status: He is alert and oriented to person, place, and time.  Psychiatric:        Behavior: Behavior normal.     ED Results / Procedures / Treatments   Labs (all labs ordered are listed, but only abnormal results are displayed) Labs Reviewed  CBC WITH DIFFERENTIAL/PLATELET - Abnormal; Notable for the following components:      Result Value   RBC 3.61 (*)    Hemoglobin 12.4 (*)    HCT 38.5 (*)    MCV 106.6 (*)    MCH 34.3 (*)    All other components within normal limits  COMPREHENSIVE METABOLIC PANEL - Abnormal; Notable for the following components:   BUN 33 (*)    GFR, Estimated 53 (*)    All other components within normal limits  URINALYSIS, ROUTINE W REFLEX MICROSCOPIC - Abnormal; Notable for the following components:   APPearance CLOUDY (*)    Hgb urine dipstick SMALL (*)    Protein, ur 30 (*)    Leukocytes,Ua LARGE (*)    WBC, UA >50 (*)    Bacteria, UA RARE (*)    All other components within normal limits  LIPASE, BLOOD    EKG None  Radiology CT Abdomen Pelvis W Contrast  Result Date: 01/28/2022 CLINICAL DATA:  Left lower quadrant abdominal pain. EXAM: CT ABDOMEN AND PELVIS WITH CONTRAST TECHNIQUE: Multidetector CT imaging of the abdomen and pelvis was performed using the standard protocol following bolus administration of intravenous contrast. RADIATION DOSE REDUCTION: This exam was performed according to the departmental dose-optimization program which includes automated exposure control, adjustment of the mA and/or kV according to patient size and/or use of iterative reconstruction technique. CONTRAST:  179m OMNIPAQUE IOHEXOL 300 MG/ML  SOLN COMPARISON:  CT of the abdomen and pelvis with contrast 07/30/2021 FINDINGS: Lower chest: Scarring and dependent atelectasis is present bilaterally, left greater than right. Airspace disease is slightly improved from prior exam. Heart is mildly  enlarged. Atherosclerotic changes present within the coronary arteries. Hepatobiliary: No focal liver abnormality is seen. No gallstones, gallbladder wall thickening, or biliary dilatation. Pancreas: Unremarkable. No pancreatic ductal dilatation or surrounding inflammatory changes. Moderate atrophy noted. Spleen: No splenic injury or perisplenic hematoma. Adrenals/Urinary Tract: Parenchymal thinning noted. Adrenal glands are normal bilaterally. No stone or mass lesion is present in either kidney. No obstruction is present. Ureters are within normal limits. A Foley catheter is present within the  urinary bladder Stomach/Bowel: Stomach is within normal limits. Duodenal ulcer again noted without associated inflammation. Duodenum is otherwise within normal limits. Fluid is noted within the distal small bowel without focal inflammation or mass. The terminal ileum is within normal limits. The appendix is visualized and normal. The ascending and transverse colon are within normal limits. Descending colon demonstrates some diverticular change without significant inflammation. The sigmoid colon extends superiorly back up to the level of the splenic flexure without obstruction. Vascular/Lymphatic: Extensive calcification is present in the aorta and branch vessels. Reproductive: The prostate is obscured by beam hardening artifact. Other: No abdominal wall hernia or abnormality. No abdominopelvic ascites. Musculoskeletal: No acute or focal osseous abnormality is present. Bilateral total hip arthroplasty present without radiographic evidence for complication. IMPRESSION: 1. No acute or focal lesion to explain the patient's left lower quadrant abdominal pain. 2. Descending and sigmoid diverticulosis without diverticulitis. 3. Foley catheter within the urinary bladder. 4. Aortic Atherosclerosis (ICD10-I70.0). Electronically Signed   By: San Morelle M.D.   On: 01/28/2022 19:46   DG Chest Port 1 View  Result Date:  01/28/2022 CLINICAL DATA:  Chest pain. EXAM: PORTABLE CHEST 1 VIEW COMPARISON:  None Available. FINDINGS: The heart size and mediastinal contours are within normal limits. There is stable scarring in the left lung apex. The lungs are otherwise clear. Visualized skeletal structures are unremarkable. IMPRESSION: No active disease. Electronically Signed   By: Ronney Asters M.D.   On: 01/28/2022 18:22    Procedures Procedures    Medications Ordered in ED Medications  ondansetron (ZOFRAN) injection 4 mg (0 mg Intravenous Hold 01/28/22 1852)  acetaminophen (TYLENOL) tablet 1,000 mg (1,000 mg Oral Given 01/28/22 1900)  morphine (PF) 2 MG/ML injection 1 mg (1 mg Intravenous Given 01/28/22 1859)  iohexol (OMNIPAQUE) 300 MG/ML solution 100 mL (100 mLs Intravenous Contrast Given 01/28/22 1921)    ED Course/ Medical Decision Making/ A&P                           Medical Decision Making Amount and/or Complexity of Data Reviewed Radiology: ordered.  Risk OTC drugs. Prescription drug management.   88 yo M with a chief complaint of abdominal discomfort.  This been going on for at least 4 days.  Seems to be coming and going.  No fevers no vomiting.  Saw his family doctor early in the illness.  Thought it was worse today.  Blood work without significant finding.  No significant leukocytosis no significant anemia no LFT elevation lipase is normal.  Will obtain a CT scan.  Chest x-ray to evaluate for possible pneumonia.  Chest x-ray independently interpreted by me without focal infiltrate and pneumothorax.  CT scan of the abdomen pelvis negative for acute intra-abdominal pathology.  On my independent interpretation of the CT images over the left upper quadrant the patient has some mild bowel dilatation with gas that tracks down the left descending colon.  I discussed this with the patient and family and he has had multiple bowel movements a day for the past week and has had quite a bit of gas.  They suspect this  could be due to recent antibiotic use for urinary tract infection.  Discussed different possible therapies at home.  We will have them follow-up with their family doctor.  8:04 PM:  I have discussed the diagnosis/risks/treatment options with the patient and family.  Evaluation and diagnostic testing in the emergency department does not suggest an emergent condition  requiring admission or immediate intervention beyond what has been performed at this time.  They will follow up with PCP. We also discussed returning to the ED immediately if new or worsening sx occur. We discussed the sx which are most concerning (e.g., sudden worsening pain, fever, inability to tolerate by mouth) that necessitate immediate return. Medications administered to the patient during their visit and any new prescriptions provided to the patient are listed below.  Medications given during this visit Medications  ondansetron (ZOFRAN) injection 4 mg (0 mg Intravenous Hold 01/28/22 1852)  acetaminophen (TYLENOL) tablet 1,000 mg (1,000 mg Oral Given 01/28/22 1900)  morphine (PF) 2 MG/ML injection 1 mg (1 mg Intravenous Given 01/28/22 1859)  iohexol (OMNIPAQUE) 300 MG/ML solution 100 mL (100 mLs Intravenous Contrast Given 01/28/22 1921)     The patient appears reasonably screen and/or stabilized for discharge and I doubt any other medical condition or other [facility] requiring further screening, evaluation, or treatment in the ED at this time prior to discharge.          Final Clinical Impression(s) / ED Diagnoses Final diagnoses:  LUQ abdominal pain    Rx / DC Orders ED Discharge Orders     None         Deno Etienne, DO 01/28/22 2004

## 2022-02-08 ENCOUNTER — Other Ambulatory Visit (HOSPITAL_COMMUNITY): Payer: Self-pay | Admitting: Internal Medicine

## 2022-02-08 DIAGNOSIS — C44329 Squamous cell carcinoma of skin of other parts of face: Secondary | ICD-10-CM

## 2022-02-08 DIAGNOSIS — C059 Malignant neoplasm of palate, unspecified: Secondary | ICD-10-CM

## 2022-02-16 NOTE — Progress Notes (Signed)
Cardiology Office Note:   Date:  02/18/2022  NAME:  Craig Snow    MRN: [id number] DOB:  February 18, 1921   PCP:  Lawerance Cruel, MD  Cardiologist:  None  Electrophysiologist:  None   Referring MD: Lawerance Cruel, MD   Chief Complaint  Patient presents with   Atrial Fibrillation    History of Present Illness:   Pilot Snow is a 88 y.o. male with a hx of Afib, bladder CA who is being seen today for the evaluation of Afib at the request of Lawerance Cruel, MD. he presents with his son.  He has had atrial fibrillation for years.  Apparently he remains on Coumadin.  This is managed by his primary care physician.  He has discussed other medications but has declined them.  He apparently will have possible hyperbaric treatment to mouth sore.  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  He apparently will need dental extractions.  His son wishes to know if his heart is healthy enough for hyperbaric treatments.  He actually had a hip fracture in April and underwent surgery.  He tolerated this well.  Craig Snow is not active.  He is using a wheelchair when he leaves the house.  He does transfers at home but largely depends on his son and caregivers for care.  He reports no chest pain or trouble breathing.  He has a chronic indwelling Foley catheter.  He seems to be having issues eating and may need dental extractions.  His blood pressure is stable.  His heart rate is in the 50s.  He reports no dizziness or lightheadedness.  He has never had a heart attack or stroke.  With his current level of activity he seems to be doing well.  He reports no major complaints today.  No strong family history of heart disease.  He is a former smoker.  He does not drink alcohol.  No drug use.  He was in the [employer].  He served in [employer].  He does follow with the [facility] as well as with his primary care physician in town.  He has not seen a cardiologist in years.  He seems to have  done well without seeing a cardiologist.  He overall seems to be stable on examination today.  He is without major complaints.  T chol 156, LDL 55, HDL 73, TG 142 CR 1.24  Problem List Paroxysmal Afib Bladder CA HTN HLD  Past Medical History: Past Medical History:  Diagnosis Date   Acute lower UTI 07/30/2021   Arrhythmia    Atrial fibrillation (HCC)    Bladder cancer (HCC)    BPH (benign prostatic hyperplasia)    Candidiasis of penis 07/30/2021   Community acquired pneumonia 07/30/2021   Hyperlipidemia    Hypertension    Neoplasm of palate 2008   Supratherapeutic INR 07/30/2021   Thyrotoxicosis     Past Surgical History: Past Surgical History:  Procedure Laterality Date   ANTERIOR APPROACH HEMI HIP ARTHROPLASTY Left 10/23/2021   Procedure: ANTERIOR APPROACH HEMI HIP ARTHROPLASTY;  Surgeon: Rod Can, MD;  Location: [facility];  Service: Orthopedics;  Laterality: Left;    Current Medications: Current Meds  Medication Sig   acetaminophen (TYLENOL) 500 MG tablet Take 1,000 mg by mouth every 6 (six) hours as needed (pain).   atorvastatin (LIPITOR) 10 MG tablet Take 10 mg by mouth at bedtime.   Docusate Sodium (COLACE PO) Take 2 capsules  by mouth daily as needed (constipation).   levothyroxine (SYNTHROID) 50 MCG tablet Take 50 mcg by mouth every morning.   liothyronine (CYTOMEL) 5 MCG tablet Take 5 mcg by mouth every morning.   Melatonin 10 MG TABS Take 10 mg by mouth at bedtime as needed (sleep).   metoprolol tartrate (LOPRESSOR) 25 MG tablet Take 1 tablet (25 mg total) by mouth 2 (two) times daily.   mirabegron ER (MYRBETRIQ) 25 MG TB24 tablet Take 1 tablet (25 mg total) by mouth daily.   mirtazapine (REMERON) 15 MG tablet Take 15 mg by mouth at bedtime.   pantoprazole (PROTONIX) 40 MG tablet Take 40 mg by mouth every morning.   phenazopyridine (PYRIDIUM) 200 MG tablet Take 200 mg by mouth every 8 (eight) hours as needed for pain.   polyethylene glycol (MIRALAX /  GLYCOLAX) 17 g packet Take 17 g by mouth every morning.   tamsulosin (FLOMAX) 0.4 MG CAPS capsule Take 0.4 mg by mouth every morning.   Vitamin D, Ergocalciferol, (DRISDOL) 1.25 MG (50000 UNIT) CAPS capsule Take 1 capsule (50,000 Units total) by mouth every Tuesday.   warfarin (COUMADIN) 2 MG tablet Take 2 mg by mouth See admin instructions. Take one tablet (2 mg) by mouth in the morning on Sunday and Tuesday morning (take 3 mg on Monday, Wednesday, Thursday, Friday)   warfarin (COUMADIN) 3 MG tablet Take 3 mg by mouth See admin instructions. Take one tablet (3 mg) by mouth on Monday, Wednesday, Thursday, Friday morning (take 2 mg on Sunday and Tuesday)     Allergies:    Aspirin, Clindamycin/lincomycin, Other, and Sulfa antibiotics   Social History: Social History   Socioeconomic History   Marital status: Widowed    Spouse name: Not on file   Number of children: Not on file   Years of education: Not on file   Highest education level: Not on file  Occupational History   Not on file  Tobacco Use   Smoking status: Former    Types: Cigarettes    Quit date: 51    Years since quitting: 50.6   Smokeless tobacco: Never  Substance and Sexual Activity   Alcohol use: Not Currently   Drug use: Not Currently   Sexual activity: Not on file  Other Topics Concern   Not on file  Social History Narrative   Not on file   Social Determinants of Health   Financial Resource Strain: Not on file  Food Insecurity: Not on file  Transportation Needs: Not on file  Physical Activity: Not on file  Stress: Not on file  Social Connections: Not on file     Family History: The patient's family history includes Cancer in his father.  ROS:   All other ROS reviewed and negative. Pertinent positives noted in the HPI.     EKGs/Labs/Other Studies Reviewed:   The following studies were personally reviewed by me today:  EKG:  EKG is ordered today.  The ekg ordered today demonstrates atrial  fibrillation heart rate 55, no acute ischemic changes or evidence of infarction, and was personally reviewed by me.   Recent Labs: 07/29/2021: TSH 1.439 08/03/2021: Magnesium 1.7 01/28/2022: ALT 18; BUN 33; Creatinine, Ser 1.24; Hemoglobin 12.4; Platelets 187; Potassium 4.9; Sodium 137   Recent Lipid Panel No results found for: "CHOL", "TRIG", "HDL", "CHOLHDL", "VLDL", "LDLCALC", "LDLDIRECT"  Physical Exam:   VS:  BP (!) 106/56   Pulse (!) 55   Ht '5\' 10"'$  (1.778 m)   Wt 135 lb  3.2 oz (61.3 kg)   BMI 19.40 kg/m    Wt Readings from Last 3 Encounters:  02/18/22 135 lb 3.2 oz (61.3 kg)  01/28/22 134 lb 12.8 oz (61.1 kg)  10/23/21 145 lb 1 oz (65.8 kg)    General: Well nourished, well developed, in no acute distress Head: Atraumatic, normal size  Eyes: PEERLA, EOMI  Neck: Supple, no JVD Endocrine: No thryomegaly Cardiac: Normal S1, S2; irregular rhythm, no murmurs rubs or gallops Lungs: Clear to auscultation bilaterally, no wheezing, rhonchi or rales  Abd: Soft, nontender, no hepatomegaly  Ext: No edema, pulses 2+ Musculoskeletal: No deformities, BUE and BLE strength normal and equal Skin: Warm and dry, no rashes   Neuro: Alert and oriented to person, place, time, and situation, CNII-XII grossly intact, no focal deficits  Psych: Normal mood and affect   ASSESSMENT:   Esiah Bazinet is a 88 y.o. male who presents for the following: 1. Paroxysmal atrial fibrillation (HCC)   2. Acquired thrombophilia ([place])   3. Preoperative cardiovascular examination     PLAN:   1. Paroxysmal atrial fibrillation (HCC) 2. Acquired thrombophilia ([place]) 3. Preoperative cardiovascular examination -He has had rate controlled atrial fibrillation for years.  Currently on Coumadin.  Not wanting to transition to NOACs.  I think this is reasonable.  His heart rate is a bit low but he remains on metoprolol.  We will keep a close eye on this.  He has no symptoms.  He reports no chest pain or trouble breathing.  He  presents overall for assessment regarding preoperative evaluation.  He will undergo hyperbaric treatments and possible dental extractions.  He recently had hip surgery in April and did well.  He cannot complete greater than 4 METS but if his surgery is needed I would recommend to proceed.  Given his age he is not a candidate for aggressive cardiovascular care.  Would recommend to continue with rate control strategy and consider possible transition to a NOAC.  Apparently cost was a big issue in the past.  We did discuss that the bleeding profile is much improved on NOACs.  He is without really cardiovascular complaints today.  He has no chest pain or trouble breathing.  He has no murmurs on exam.  Would recommend to continue conservative approach given his age.  We will see him back as needed.  He seems to have done quite well without seeing a cardiologist.      Disposition: Return if symptoms worsen or fail to improve.  Medication Adjustments/Labs and Tests Ordered: Current medicines are reviewed at length with the patient today.  Concerns regarding medicines are outlined above.  Orders Placed This Encounter  Procedures   EKG 12-Lead   No orders of the defined types were placed in this encounter.   Patient Instructions  Medication Instructions:  The current medical regimen is effective;  continue present plan and medications.  *If you need a refill on your cardiac medications before your next appointment, please call your pharmacy*   Follow-Up: At [facility], you and your health needs are our priority.  As part of our continuing mission to provide you with exceptional heart care, we have created designated Provider Care Teams.  These Care Teams include your primary Cardiologist (physician) and Advanced Practice Providers (APPs -  Physician Assistants and Nurse Practitioners) who all work together to provide you with the care you need, when you need it.  We recommend signing up for the  patient portal called "MyChart".  Sign  up information is provided on this After Visit Summary.  MyChart is used to connect with patients for Virtual Visits (Telemedicine).  Patients are able to view lab/test results, encounter notes, upcoming appointments, etc.  Non-urgent messages can be sent to your provider as well.   To learn more about what you can do with MyChart, go to [email or web address].    Your next appointment:   As needed  The format for your next appointment:   In Person  Provider:   Eleonore Chiquito, MD             Signed, Addison Naegeli. Audie Box, MD, [facility]  [address], [place], [place] [address] [phone]  02/18/2022 11:47 AM

## 2022-02-17 ENCOUNTER — Ambulatory Visit (HOSPITAL_BASED_OUTPATIENT_CLINIC_OR_DEPARTMENT_OTHER): Payer: Medicare Other | Admitting: Cardiology

## 2022-02-18 ENCOUNTER — Encounter: Payer: Self-pay | Admitting: Cardiovascular Disease

## 2022-02-18 ENCOUNTER — Ambulatory Visit (INDEPENDENT_AMBULATORY_CARE_PROVIDER_SITE_OTHER): Payer: Medicare Other | Admitting: Cardiovascular Disease

## 2022-02-18 VITALS — BP 106/56 | HR 55 | Ht 70.0 in | Wt 135.2 lb

## 2022-02-18 DIAGNOSIS — I480 Paroxysmal atrial fibrillation: Secondary | ICD-10-CM | POA: Diagnosis not present

## 2022-02-18 DIAGNOSIS — Z01810 Encounter for preprocedural cardiovascular examination: Secondary | ICD-10-CM | POA: Diagnosis not present

## 2022-02-18 DIAGNOSIS — D6869 Other thrombophilia: Secondary | ICD-10-CM | POA: Diagnosis not present

## 2022-02-18 NOTE — Patient Instructions (Signed)
Medication Instructions:  The current medical regimen is effective;  continue present plan and medications.  *If you need a refill on your cardiac medications before your next appointment, please call your pharmacy*    Follow-Up: At [facility], you and your health needs are our priority.  As part of our continuing mission to provide you with exceptional heart care, we have created designated Provider Care Teams.  These Care Teams include your primary Cardiologist (physician) and Advanced Practice Providers (APPs -  Physician Assistants and Nurse Practitioners) who all work together to provide you with the care you need, when you need it.  We recommend signing up for the patient portal called "MyChart".  Sign up information is provided on this After Visit Summary.  MyChart is used to connect with patients for Virtual Visits (Telemedicine).  Patients are able to view lab/test results, encounter notes, upcoming appointments, etc.  Non-urgent messages can be sent to your provider as well.   To learn more about what you can do with MyChart, go to [email or web address].    Your next appointment:   As needed  The format for your next appointment:   In Person  Provider:   Silvis O'Neal, MD      

## 2022-02-20 ENCOUNTER — Ambulatory Visit (HOSPITAL_COMMUNITY)
Admission: RE | Admit: 2022-02-20 | Discharge: 2022-02-20 | Disposition: A | Discharge status: Home / Self Care | Payer: Medicare Other | Source: Ambulatory Visit | Attending: Internal Medicine | Admitting: Internal Medicine

## 2022-02-20 DIAGNOSIS — C059 Malignant neoplasm of palate, unspecified: Secondary | ICD-10-CM | POA: Diagnosis present

## 2022-02-20 DIAGNOSIS — C44329 Squamous cell carcinoma of skin of other parts of face: Secondary | ICD-10-CM | POA: Diagnosis present

## 2022-02-20 MED ORDER — GADOBUTROL 1 MMOL/ML IV SOLN
6.1000 mL | Freq: Once | INTRAVENOUS | Status: AC | PRN
  Administered 2022-02-20: 6.1 mL via INTRAVENOUS

## 2022-03-25 ENCOUNTER — Ambulatory Visit: Payer: Medicare Other | Admitting: Podiatry

## 2022-03-28 ENCOUNTER — Ambulatory Visit: Payer: Medicare Other | Admitting: Podiatry

## 2022-03-29 ENCOUNTER — Other Ambulatory Visit: Payer: Self-pay

## 2022-03-29 ENCOUNTER — Ambulatory Visit: Payer: Medicare Other | Attending: Family Medicine | Admitting: Speech Pathology

## 2022-03-29 ENCOUNTER — Encounter: Payer: Self-pay | Admitting: Podiatry

## 2022-03-29 ENCOUNTER — Ambulatory Visit (INDEPENDENT_AMBULATORY_CARE_PROVIDER_SITE_OTHER): Payer: Medicare Other | Admitting: Podiatry

## 2022-03-29 DIAGNOSIS — B351 Tinea unguium: Secondary | ICD-10-CM

## 2022-03-29 DIAGNOSIS — M79675 Pain in left toe(s): Secondary | ICD-10-CM | POA: Diagnosis not present

## 2022-03-29 DIAGNOSIS — D689 Coagulation defect, unspecified: Secondary | ICD-10-CM

## 2022-03-29 DIAGNOSIS — R1310 Dysphagia, unspecified: Secondary | ICD-10-CM | POA: Insufficient documentation

## 2022-03-29 DIAGNOSIS — R471 Dysarthria and anarthria: Secondary | ICD-10-CM | POA: Insufficient documentation

## 2022-03-29 DIAGNOSIS — M79674 Pain in right toe(s): Secondary | ICD-10-CM

## 2022-03-29 NOTE — Therapy (Signed)
OUTPATIENT SPEECH LANGUAGE PATHOLOGY EVALUATION   Patient Name: Craig Snow MRN: [id number] DOB:May 08, 1921, 88 y.o., male Today's Date: 03/29/2022  PCP: Lawerance Cruel, MD REFERRING PROVIDER: Lawerance Cruel, MD   End of Session - 03/29/22 1421     Visit Number 1    Number of Visits 13    Date for SLP Re-Evaluation 06/21/22    Authorization Type medicare + [facility]    Progress Note Due on Visit 10    SLP Start Time 1229    SLP Stop Time  1660    SLP Time Calculation (min) 46 min    Activity Tolerance Patient tolerated treatment well             Past Medical History:  Diagnosis Date   Acute lower UTI 07/30/2021   Arrhythmia    Atrial fibrillation (HCC)    Bladder cancer ([place])    BPH (benign prostatic hyperplasia)    Candidiasis of penis 07/30/2021   Community acquired pneumonia 07/30/2021   Hyperlipidemia    Hypertension    Neoplasm of palate 2008   Supratherapeutic INR 07/30/2021   Thyrotoxicosis    Past Surgical History:  Procedure Laterality Date   ANTERIOR APPROACH HEMI HIP ARTHROPLASTY Left 10/23/2021   Procedure: ANTERIOR APPROACH HEMI HIP ARTHROPLASTY;  Surgeon: Rod Can, MD;  Location: [facility];  Service: Orthopedics;  Laterality: Left;   Patient Active Problem List   Diagnosis Date Noted   Pain due to onychomycosis of toenails of both feet 12/22/2021   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[place]) 12/22/2021   Femoral neck fracture ([place]) 10/22/2021   HLD (hyperlipidemia) 10/22/2021   HTN (hypertension) 10/22/2021   Hypothyroidism 10/22/2021   Stage 1 skin ulcer of sacral region ([place]) 07/30/2021   History of bladder cancer 07/30/2021   Atrial fibrillation ([place]) 07/29/2021    ONSET DATE: referral 03/23/2022   REFERRING DIAG: R47.9 (ICD-10-CM) - Unspecified speech disturbances  THERAPY DIAG:  Dysphagia, unspecified type  Dysarthria and anarthria  Rationale for Evaluation and Treatment Rehabilitation  SUBJECTIVE:   SUBJECTIVE STATEMENT: "I can't hear"  Pt arrives with one hearing aid vs two Pt accompanied by: family member (son, Richardson Landry)  PERTINENT HISTORY: Went to [place] in [place] recently for swallow study; pt received [place], working on swallowing. Reports was on thickened liquids but not any longer d/t pt preference. Eating puree diet.   PAIN:  Are you having pain? Yes: NPRS scale: 4/10 Pain location: L side of mouth   FALLS: Has patient fallen in last 6 months?  Yes  LIVING ENVIRONMENT: Lives with: lives with their family Lives in: House/apartment  PLOF:  Level of assistance: Needed assistance with ADLs, Needed assistance with IADLS Employment: Retired   PATIENT GOALS "improve swallow"  OBJECTIVE:    COGNITION: Overall cognitive status: Impaired Functional deficits: requires A for ADLs, has support of family. Not concerned for cognition at this time, pt at baseline.   AUDITORY COMPREHENSION: Overall auditory comprehension: Impaired: simple Conversation: Simple Interfering components: hearing Effective technique: repetition/stressing words, slowed speech, written cues, and stressing words  EXPRESSION: verbal  VERBAL EXPRESSION: Level of generative/spontaneous verbalization: sentence and conversation  MOTOR SPEECH: Overall motor speech: impaired Level of impairment: Word Respiration: thoracic breathing Phonation: normal Resonance: hypernasality Articulation: Impaired: word, phrase, sentence, and conversation Intelligibility: Intelligibility reduced Motor planning: Appears intact Interfering components: premorbid status, anatomical limitations, and inadequate dentition Effective technique: slow rate, increased vocal intensity, and pacing  ORAL MOTOR EXAMINATION Overall status: Impaired:   Labial: Left (ROM, Symmetry, and  Strength) Lingual: Left (ROM, Symmetry, Strength, and Coordination) Facial: Left (Symmetry and Sensation) Velum: Symmetry Mandible: ROM  Cough: WFL Comments: pt and son reporting prior  cancer + radiation of palate   RECOMMENDATIONS FROM OBJECTIVE SWALLOW STUDY (MBSS/FEES):  pt had swallow study at [place] center, is unavailable to this SLP. SLP requested pt's son provide report to inform clinical decision making.    CLINICAL SWALLOW ASSESSMENT:   Current diet: Dysphagia 1 (puree) and thin liquids Dentition:  few natural teeth remaining, overall edentulous Patient directly observed with POs: Yes: thin liquids  Feeding: able to feed self Liquids provided by: cup Oral phase signs and symptoms:  n/a Pharyngeal phase signs and symptoms:  n/a Comments: Pt with palatal obturator, ill-fitting. Reporting will be having surgery to remove several roots and remaining teeth to facilitate improved fitting palatal obturator. Initial surgery next week, success and recovery of that surgery will determine scheduling of further surgeries, per pt's son.    TODAY'S TREATMENT:  Updated pt's dysphagia HEP. Eduction on swallow function and efficacy. Further discussion on factors which are currently impeding pt's success with PO. Education on evaluation results and collaborated to generate plan of care. Answer pt and pt's son's questions to satisfaction.   PATIENT EDUCATION: Education details: see above Person educated: Patient and Child(ren) Education method: Explanation, Demonstration, Verbal cues, and Handouts Education comprehension: verbalized understanding, returned demonstration, verbal cues required, and needs further education  GOALS: Goals reviewed with patient? Yes  SHORT TERM GOALS: Target date: 05/10/2022  Pt will report compliance with daily dysphagia HEP over 1 week period with occasional verbal cues from care partners Baseline: Goal status: INITIAL  2.  Pt will demonstrate all prescribed exercises with rare verbal cues over 2 sessions Baseline:  Goal status: INITIAL  3.  Pt's care-partner will teach back swallow compensations and strategies which may  optimize pt's swallow safety and efficacy with PO at home Baseline:  Goal status: INITIAL   LONG TERM GOALS: Target date: 06/21/2022  Pt will complete follow up instrumental swallow evaluation to determine change in swallow function  Baseline:  Goal status: INITIAL  2.  Pt will complete prescribed dysphagia exercises with use of visual aid, per recommended frequency of trials during therapy session  Baseline:  Goal status: INITIAL  3.  Pt and care-partner will report carryover of swallow strategies and compensations in home environment with mod-I over 1 week period Baseline:  Goal status: INITIAL  4.  Pt will report improvement in swallow function via PROM by d/c Baseline:  Goal status: INITIAL  ASSESSMENT:  CLINICAL IMPRESSION: Patient is a 88 y.o. M who was seen today for dysphagia evaluation. True nature of swallowing impairment difficult to assess d/t objective swallow evaluation not available, limited medical history to review as pt seen at [facility], and dentition/oral issues impacting ability for full clinical swallow eval. Clinical decisions made based on thin liquid administration, pt and pt son reports, and collaboration with pt for generating patient centered plan of care. Suspect oropharyngeal dysphagia with decreased airway protection d/t reports that pt was suggested to be on thickened liquids. Pt and family have elected for pt to have thin liquids for QoL and to increase hydration. No overt s/sx of aspiration with sequential sips of thin liquids this date, however pt could have silent aspiration per instrumental eval which was unavailable to SLP. D/t oral sore and primarily being edentulous, pt on fully puree diet. Denies issues with purees. Has been working with home  health SLP with exercise regimen established. SLP recommends pt continue with exercises, with increase for effortful swallow. Effortful swallow was chosen d/t it's potential efficacy in rehabilitating  numerous potential swallow impairments. Oral phrase of swallow largely impacted by being edentulous and with ill fitting palatal obturator. Palatal obturator noted to be falling out whenever pt spoke this date. Pt has upcoming surgery to address this. Speech notable for significantly reduced loudness and clarity, which is reportedly ongoing since mouth cancer and treatment. With reduction in background noise and use of context, overall intelligible with occasional repetition to trained listener. Plan to establish therapeutic treatment session schedule and initiate interventions once pt has initial dental surgery following week and has update on how long dental surgery process should last. Pt will likely see most significant improvement in swallow function once oral surgeries are completed. SLP recommends repeat instrumental swallow study to assess change in swallow function potentially resulting from ongoing dysphagia exercises pt has been completing with home health SLP to fully inform targeted interventions.  OBJECTIVE IMPAIRMENTS include dysarthria and dysphagia. These impairments are limiting patient from effectively communicating at home and in community and safety when swallowing. Factors affecting potential to achieve goals and functional outcome are ability to learn/carryover information, previous level of function, and severity of impairments. Patient will benefit from skilled SLP services to address above impairments and improve overall function.  REHAB POTENTIAL: Fair    PLAN: SLP FREQUENCY: 1-2x/week  SLP DURATION: 12 weeks  PLANNED INTERVENTIONS: Aspiration precaution training, Pharyngeal strengthening exercises, Diet toleration management , Trials of upgraded texture/liquids, Internal/external aids, SLP instruction and feedback, Compensatory strategies, and Patient/family education    Su Monks, [place] 03/29/2022, 4:25 PM

## 2022-03-29 NOTE — Progress Notes (Signed)
This patient presents to the office with chief complaint of long thick painful nails.  Patient says the nails are painful walking and wearing shoes.  This patient is unable to self treat.  This patient is unable to trim his  nails since she is unable to reach his  nails.  She presents to the office for preventative foot care services.  General Appearance  Alert, conversant and in no acute stress.  Vascular  Dorsalis pedis and posterior tibial  pulses are palpable  bilaterally.  Capillary return is within normal limits  bilaterally. Temperature is within normal limits  bilaterally.  Neurologic  Senn-Weinstein monofilament wire test within normal limits  bilaterally. Muscle power within normal limits bilaterally.  Nails Thick disfigured discolored nails with subungual debris  from hallux to fifth toes bilaterally. No evidence of bacterial infection or drainage bilaterally.  Orthopedic  No limitations of motion  feet .  No crepitus or effusions noted.  No bony pathology or digital deformities noted.  HAV  B/L.  Skin  normotropic skin with no porokeratosis noted bilaterally.  No signs of infections or ulcers noted.     Onychomycosis  Nails  B/L.  Pain in right toes  Pain in left toes  Debridement of nails both feet followed trimming the nails with dremel tool.    RTC 3 months.   Gardiner Barefoot DPM

## 2022-03-29 NOTE — Patient Instructions (Signed)
Hard swallow: squeeze hard as you swallow  Complete 100 a day!  5 sets of 20 reps  Open up your stopwatch, do a hard swallow every 15 seconds for 5 minutes. Drink water as needed.

## 2022-04-12 ENCOUNTER — Ambulatory Visit: Payer: Self-pay | Admitting: Student

## 2022-04-12 ENCOUNTER — Other Ambulatory Visit: Payer: Self-pay

## 2022-04-12 ENCOUNTER — Encounter (HOSPITAL_COMMUNITY): Payer: Self-pay | Admitting: Orthopedic Surgery

## 2022-04-12 NOTE — Progress Notes (Addendum)
For Short Stay: [place] appointment date: N/A Date of COVID positive in last 90 days: N/A  Bowel Prep reminder:N/A   For Anesthesia: PCP - Lawerance Cruel, MD and [facility] Cardiologist - O'Neal, Cassie Freer, MD last office visit note 02/18/22 in epic  Chest x-ray - 01/28/22 in epic EKG - 02/18/22 in epic Stress Test - greater than 2 years possibly ECHO - N/A Cardiac Cath - N/A Pacemaker/ICD device last checked:N/A Pacemaker orders received: N/A Device Rep notified: N/A  Spinal Cord Stimulator: N/A  Sleep Study - N/A CPAP - N/A  Fasting Blood Sugar - N/A Checks Blood Sugar ___N/A__ times a day Date and result of last Hgb A1c- N/A  Blood Thinner Instructions: Coumadin last dose 04/12/22 0830 AM Aspirin Instructions: N/A Last Dose:N/A  Activity level: not active uses wheelchair     Anesthesia review: Paroxysmal Atrial Fibrillation  Patient denies shortness of breath, fever, cough and chest pain at PAT appointment   Patient verbalized understanding of instructions that were given to them at the PAT appointment. Patient was also instructed that they will need to review over the PAT instructions again at home before surgery.

## 2022-04-12 NOTE — Progress Notes (Signed)
Anesthesia Chart Review   Case: [id number] Date/Time: 04/13/22 1010   Procedures:      IRRIGATION AND DEBRIDEMENT HIP (Left) - 120     POSSIBLE HEAD BALL EXCHANGE (Left: Hip) - 120   Anesthesia type: Choice   Pre-op diagnosis: Wound dehisance left hip   Location: [facility] ROOM 06 / [facility]   Surgeons: Rod Can, MD       DISCUSSION:88 y.o. former smoker with h/o HTN, atrial fibrillation, BPH, wound dehisance left hip scheduled for above procedure 04/13/2022 with Dr. Rod Can.   Pt last seen by cardiology 02/18/2022. Per OV note, "He has had rate controlled atrial fibrillation for years.  Currently on Coumadin.  Not wanting to transition to NOACs.  I think this is reasonable.  His heart rate is a bit low but he remains on metoprolol.  We will keep a close eye on this.  He has no symptoms.  He reports no chest pain or trouble breathing.  He presents overall for assessment regarding preoperative evaluation.  He will undergo hyperbaric treatments and possible dental extractions.  He recently had hip surgery in April and did well.  He cannot complete greater than 4 METS but if his surgery is needed I would recommend to proceed.  Given his age he is not a candidate for aggressive cardiovascular care.  Would recommend to continue with rate control strategy and consider possible transition to a NOAC.  Apparently cost was a big issue in the past.  We did discuss that the bleeding profile is much improved on NOACs.  He is without really cardiovascular complaints today.  He has no chest pain or trouble breathing.  He has no murmurs on exam.  Would recommend to continue conservative approach given his age.  We will see him back as needed.  He seems to have done quite well without seeing a cardiologist."  Last dose of Coumadin 04/12/2022 at 8:30am. Discussed with Dr. Sid Falcon office. Per Dr. Lyla Glassing case is emergent and will proceed.   Labs DOS, same day workup. VS: Ht '5\' 11"'$  (1.803 m)   Wt 61.8 kg    BMI 19.00 kg/m   PROVIDERS: Lawerance Cruel, MD is PCP    LABS:  labs DOS (all labs ordered are listed, but only abnormal results are displayed)  Labs Reviewed - No data to display   IMAGES:   EKG:   CV:  Past Medical History:  Diagnosis Date   Acquired thrombophilia ([place])    Acute lower UTI 07/30/2021   Arrhythmia    Atrial fibrillation (HCC)    Bladder cancer (HCC)    BPH (benign prostatic hyperplasia)    Candidiasis of penis 07/30/2021   Chronic ear infection, left    Community acquired pneumonia 07/30/2021   aspiration   GERD (gastroesophageal reflux disease)    Headache    History of radiation therapy    oral cancer   Hyperlipidemia    Hypertension    Indwelling Foley catheter present    Neoplasm of palate 2008   Poor dentition    Ruptured eardrum, left    absent eardrum   Supratherapeutic INR 07/30/2021   Thyrotoxicosis     Past Surgical History:  Procedure Laterality Date   ANTERIOR APPROACH HEMI HIP ARTHROPLASTY Left 10/23/2021   Procedure: ANTERIOR APPROACH HEMI HIP ARTHROPLASTY;  Surgeon: Rod Can, MD;  Location: [facility];  Service: Orthopedics;  Laterality: Left;   MOUTH SURGERY     cancer    MEDICATIONS: No current  facility-administered medications for this encounter.    acetaminophen (TYLENOL) 650 MG CR tablet   amoxicillin (AMOXIL) 500 MG capsule   atorvastatin (LIPITOR) 10 MG tablet   ferrous sulfate 325 (65 FE) MG tablet   furosemide (LASIX) 20 MG tablet   levothyroxine (SYNTHROID) 50 MCG tablet   liothyronine (CYTOMEL) 5 MCG tablet   melatonin 5 MG TABS   metoprolol tartrate (LOPRESSOR) 25 MG tablet   mirtazapine (REMERON) 15 MG tablet   pantoprazole (PROTONIX) 40 MG tablet   potassium chloride (KLOR-CON) 10 MEQ tablet   PRESCRIPTION MEDICATION   traMADol (ULTRAM) 50 MG tablet   warfarin (COUMADIN) 2 MG tablet   warfarin (COUMADIN) 3 MG tablet   mirabegron ER (MYRBETRIQ) 25 MG TB24 tablet   Vitamin D, Ergocalciferol,  (DRISDOL) 1.25 MG (50000 UNIT) CAPS capsule     Konrad Felix Ward, PA-C [facility] Pre-Surgical Testing [phone]

## 2022-04-12 NOTE — Anesthesia Preprocedure Evaluation (Addendum)
Anesthesia Evaluation  Patient identified by MRN, date of birth, ID band Patient awake    Reviewed: Allergy & Precautions, NPO status , Patient's Chart, lab work & pertinent test results  Airway Mallampati: II       Dental   Pulmonary pneumonia, former smoker,    breath sounds clear to auscultation       Cardiovascular hypertension,  Rhythm:Regular Rate:Normal     Neuro/Psych  Headaches,    GI/Hepatic Neg liver ROS, GERD  ,  Endo/Other  Hypothyroidism   Renal/GU negative Renal ROS     Musculoskeletal   Abdominal   Peds  Hematology   Anesthesia Other Findings   Reproductive/Obstetrics                            Anesthesia Physical Anesthesia Plan  ASA: 3  Anesthesia Plan: General   Post-op Pain Management:    Induction: Intravenous  PONV Risk Score and Plan: 3 and Ondansetron, Dexamethasone and Treatment may vary due to age or medical condition  Airway Management Planned: Oral ETT  Additional Equipment:   Intra-op Plan:   Post-operative Plan: Possible Post-op intubation/ventilation  Informed Consent: I have reviewed the patients History and Physical, chart, labs and discussed the procedure including the risks, benefits and alternatives for the proposed anesthesia with the patient or authorized representative who has indicated his/her understanding and acceptance.     Dental advisory given  Plan Discussed with: CRNA and Anesthesiologist  Anesthesia Plan Comments: (See PAT note 04/12/2022)      Anesthesia Quick Evaluation

## 2022-04-13 ENCOUNTER — Encounter (HOSPITAL_COMMUNITY): Payer: Self-pay | Admitting: Orthopedic Surgery

## 2022-04-13 ENCOUNTER — Inpatient Hospital Stay (HOSPITAL_COMMUNITY): Payer: Medicare Other

## 2022-04-13 ENCOUNTER — Encounter (HOSPITAL_COMMUNITY)
Admission: RE | Disposition: A | Discharge status: Home Health | Payer: Self-pay | Source: Home / Self Care | Attending: Orthopedic Surgery

## 2022-04-13 ENCOUNTER — Other Ambulatory Visit: Payer: Self-pay

## 2022-04-13 ENCOUNTER — Inpatient Hospital Stay (HOSPITAL_COMMUNITY)
Admission: RE | Admit: 2022-04-13 | Discharge: 2022-04-18 | DRG: 903 | Disposition: A | Discharge status: Home Health | Payer: Medicare Other | Attending: Orthopedic Surgery | Admitting: Orthopedic Surgery

## 2022-04-13 ENCOUNTER — Inpatient Hospital Stay (HOSPITAL_COMMUNITY): Payer: Medicare Other | Admitting: Physician Assistant

## 2022-04-13 DIAGNOSIS — Z66 Do not resuscitate: Secondary | ICD-10-CM | POA: Diagnosis present

## 2022-04-13 DIAGNOSIS — I10 Essential (primary) hypertension: Secondary | ICD-10-CM | POA: Diagnosis present

## 2022-04-13 DIAGNOSIS — Z886 Allergy status to analgesic agent status: Secondary | ICD-10-CM

## 2022-04-13 DIAGNOSIS — Z881 Allergy status to other antibiotic agents status: Secondary | ICD-10-CM | POA: Diagnosis not present

## 2022-04-13 DIAGNOSIS — Z96642 Presence of left artificial hip joint: Secondary | ICD-10-CM | POA: Diagnosis present

## 2022-04-13 DIAGNOSIS — E039 Hypothyroidism, unspecified: Secondary | ICD-10-CM

## 2022-04-13 DIAGNOSIS — I480 Paroxysmal atrial fibrillation: Secondary | ICD-10-CM | POA: Diagnosis present

## 2022-04-13 DIAGNOSIS — Z882 Allergy status to sulfonamides status: Secondary | ICD-10-CM | POA: Diagnosis not present

## 2022-04-13 DIAGNOSIS — Z885 Allergy status to narcotic agent status: Secondary | ICD-10-CM

## 2022-04-13 DIAGNOSIS — Z79890 Hormone replacement therapy (postmenopausal): Secondary | ICD-10-CM | POA: Diagnosis not present

## 2022-04-13 DIAGNOSIS — E785 Hyperlipidemia, unspecified: Secondary | ICD-10-CM | POA: Diagnosis present

## 2022-04-13 DIAGNOSIS — N400 Enlarged prostate without lower urinary tract symptoms: Secondary | ICD-10-CM | POA: Diagnosis present

## 2022-04-13 DIAGNOSIS — T8131XA Disruption of external operation (surgical) wound, not elsewhere classified, initial encounter: Secondary | ICD-10-CM | POA: Diagnosis present

## 2022-04-13 DIAGNOSIS — F0390 Unspecified dementia without behavioral disturbance: Secondary | ICD-10-CM | POA: Diagnosis present

## 2022-04-13 DIAGNOSIS — Z79899 Other long term (current) drug therapy: Secondary | ICD-10-CM | POA: Diagnosis not present

## 2022-04-13 DIAGNOSIS — Z809 Family history of malignant neoplasm, unspecified: Secondary | ICD-10-CM

## 2022-04-13 DIAGNOSIS — Z87891 Personal history of nicotine dependence: Secondary | ICD-10-CM

## 2022-04-13 DIAGNOSIS — Z923 Personal history of irradiation: Secondary | ICD-10-CM | POA: Diagnosis not present

## 2022-04-13 DIAGNOSIS — Y793 Surgical instruments, materials and orthopedic devices (including sutures) associated with adverse incidents: Secondary | ICD-10-CM | POA: Diagnosis present

## 2022-04-13 DIAGNOSIS — D689 Coagulation defect, unspecified: Principal | ICD-10-CM

## 2022-04-13 DIAGNOSIS — Z7901 Long term (current) use of anticoagulants: Secondary | ICD-10-CM | POA: Diagnosis not present

## 2022-04-13 DIAGNOSIS — Z8551 Personal history of malignant neoplasm of bladder: Secondary | ICD-10-CM

## 2022-04-13 DIAGNOSIS — K219 Gastro-esophageal reflux disease without esophagitis: Secondary | ICD-10-CM | POA: Diagnosis present

## 2022-04-13 DIAGNOSIS — Z01818 Encounter for other preprocedural examination: Secondary | ICD-10-CM

## 2022-04-13 HISTORY — DX: Other thrombophilia: D68.69

## 2022-04-13 HISTORY — DX: Presence of other specified devices: Z97.8

## 2022-04-13 HISTORY — DX: Personal history of irradiation: Z92.3

## 2022-04-13 HISTORY — DX: Disorder of teeth and supporting structures, unspecified: K08.9

## 2022-04-13 HISTORY — PX: INCISION AND DRAINAGE OF WOUND: SHX1803

## 2022-04-13 HISTORY — DX: Unspecified perforation of tympanic membrane, left ear: H72.92

## 2022-04-13 HISTORY — DX: Gastro-esophageal reflux disease without esophagitis: K21.9

## 2022-04-13 HISTORY — DX: Headache, unspecified: R51.9

## 2022-04-13 HISTORY — DX: Otitis media, unspecified, left ear: H66.92

## 2022-04-13 LAB — CBC
HCT: 36.9 % — ABNORMAL LOW (ref 39.0–52.0)
Hemoglobin: 11.8 g/dL — ABNORMAL LOW (ref 13.0–17.0)
MCH: 34.3 pg — ABNORMAL HIGH (ref 26.0–34.0)
MCHC: 32 g/dL (ref 30.0–36.0)
MCV: 107.3 fL — ABNORMAL HIGH (ref 80.0–100.0)
Platelets: 150 10*3/uL (ref 150–400)
RBC: 3.44 MIL/uL — ABNORMAL LOW (ref 4.22–5.81)
RDW: 14.9 % (ref 11.5–15.5)
WBC: 8.7 10*3/uL (ref 4.0–10.5)
nRBC: 0 % (ref 0.0–0.2)

## 2022-04-13 LAB — BASIC METABOLIC PANEL
Anion gap: 6 (ref 5–15)
BUN: 32 mg/dL — ABNORMAL HIGH (ref 8–23)
CO2: 27 mmol/L (ref 22–32)
Calcium: 9.8 mg/dL (ref 8.9–10.3)
Chloride: 103 mmol/L (ref 98–111)
Creatinine, Ser: 1.1 mg/dL (ref 0.61–1.24)
GFR, Estimated: >60 mL/min (ref >60)
Glucose, Bld: 95 mg/dL (ref 70–99)
Potassium: 3.8 mmol/L (ref 3.5–5.1)
Sodium: 136 mmol/L (ref 135–145)

## 2022-04-13 LAB — SYNOVIAL CELL COUNT + DIFF, W/ CRYSTALS
Crystals, Fluid: NONE SEEN
Eosinophils-Synovial: 0 % (ref 0–1)
Lymphocytes-Synovial Fld: 79 % — ABNORMAL HIGH (ref 0–20)
Monocyte-Macrophage-Synovial Fluid: 19 % — ABNORMAL LOW (ref 50–90)
Neutrophil, Synovial: 2 % (ref 0–25)
WBC, Synovial: 173 /mm3 (ref 0–200)

## 2022-04-13 LAB — APTT: aPTT: 36 seconds (ref 24–36)

## 2022-04-13 LAB — TYPE AND SCREEN
ABO/RH(D): A POS
Antibody Screen: NEGATIVE

## 2022-04-13 LAB — PROTIME-INR
INR: 1.3 — ABNORMAL HIGH (ref 0.8–1.2)
Prothrombin Time: 16.1 seconds — ABNORMAL HIGH (ref 11.4–15.2)

## 2022-04-13 LAB — SURGICAL PCR SCREEN
MRSA, PCR: POSITIVE — AB
Staphylococcus aureus: POSITIVE — AB

## 2022-04-13 SURGERY — IRRIGATION AND DEBRIDEMENT WOUND
Anesthesia: General | Laterality: Left

## 2022-04-13 MED ORDER — ACETAMINOPHEN 325 MG PO TABS
325.0000 mg | ORAL_TABLET | Freq: Four times a day (QID) | ORAL | Status: DC | PRN
  Administered 2022-04-13 – 2022-04-18 (×10): 650 mg via ORAL
  Filled 2022-04-13 (×5): qty 2
  Filled 2022-04-13: qty 1
  Filled 2022-04-13 (×8): qty 2

## 2022-04-13 MED ORDER — PHENYLEPHRINE HCL-NACL 20-0.9 MG/250ML-% IV SOLN
INTRAVENOUS | Status: DC | PRN
  Administered 2022-04-13: 40 ug/min via INTRAVENOUS

## 2022-04-13 MED ORDER — ORAL CARE MOUTH RINSE: 15.0000 mL | Freq: Once | OROMUCOSAL | Status: DC

## 2022-04-13 MED ORDER — METOPROLOL TARTRATE 25 MG PO TABS
25.0000 mg | ORAL_TABLET | Freq: Two times a day (BID) | ORAL | Status: DC
  Administered 2022-04-13 – 2022-04-18 (×8): 25 mg via ORAL
  Filled 2022-04-13 (×11): qty 1

## 2022-04-13 MED ORDER — ACETAMINOPHEN 325 MG PO TABS: 325.0000 mg | ORAL_TABLET | Freq: Four times a day (QID) | ORAL | Status: DC | PRN

## 2022-04-13 MED ORDER — LIDOCAINE 2% (20 MG/ML) 5 ML SYRINGE
INTRAMUSCULAR | Status: DC | PRN
  Administered 2022-04-13: 60 mg via INTRAVENOUS

## 2022-04-13 MED ORDER — ONDANSETRON HCL 4 MG/2ML IJ SOLN
INTRAMUSCULAR | Status: AC
  Filled 2022-04-13: qty 2

## 2022-04-13 MED ORDER — LACTATED RINGERS IV SOLN: INTRAVENOUS | Status: DC

## 2022-04-13 MED ORDER — POLYETHYLENE GLYCOL 3350 17 G PO PACK: 17.0000 g | PACK | Freq: Every day | ORAL | Status: DC | PRN

## 2022-04-13 MED ORDER — LIOTHYRONINE SODIUM 5 MCG PO TABS
5.0000 ug | ORAL_TABLET | Freq: Every morning | ORAL | Status: DC
  Administered 2022-04-14 – 2022-04-18 (×5): 5 ug via ORAL
  Filled 2022-04-13 (×5): qty 1

## 2022-04-13 MED ORDER — PHENYLEPHRINE 80 MCG/ML (10ML) SYRINGE FOR IV PUSH (FOR BLOOD PRESSURE SUPPORT)
PREFILLED_SYRINGE | INTRAVENOUS | Status: DC | PRN
  Administered 2022-04-13 (×2): 80 ug via INTRAVENOUS
  Administered 2022-04-13: 120 ug via INTRAVENOUS

## 2022-04-13 MED ORDER — TRAMADOL HCL 50 MG PO TABS
50.0000 mg | ORAL_TABLET | Freq: Four times a day (QID) | ORAL | Status: DC | PRN
  Administered 2022-04-16 – 2022-04-17 (×2): 50 mg via ORAL
  Filled 2022-04-13 (×2): qty 1

## 2022-04-13 MED ORDER — MELATONIN 5 MG PO TABS
5.0000 mg | ORAL_TABLET | Freq: Every day | ORAL | Status: DC
  Administered 2022-04-13 – 2022-04-17 (×5): 5 mg via ORAL
  Filled 2022-04-13 (×5): qty 1

## 2022-04-13 MED ORDER — FENTANYL CITRATE (PF) 100 MCG/2ML IJ SOLN
INTRAMUSCULAR | Status: AC
  Filled 2022-04-13: qty 2

## 2022-04-13 MED ORDER — SODIUM CHLORIDE 0.9 % IV SOLN: INTRAVENOUS | Status: DC

## 2022-04-13 MED ORDER — WARFARIN - PHARMACIST DOSING INPATIENT: Freq: Every day | Status: DC

## 2022-04-13 MED ORDER — PHENYLEPHRINE HCL (PRESSORS) 10 MG/ML IV SOLN
INTRAVENOUS | Status: AC
  Filled 2022-04-13: qty 1

## 2022-04-13 MED ORDER — METOCLOPRAMIDE HCL 5 MG/ML IJ SOLN: 5.0000 mg | Freq: Three times a day (TID) | INTRAMUSCULAR | Status: DC | PRN

## 2022-04-13 MED ORDER — AMOXICILLIN 500 MG PO CAPS
500.0000 mg | ORAL_CAPSULE | Freq: Three times a day (TID) | ORAL | Status: DC
  Administered 2022-04-13 – 2022-04-15 (×5): 500 mg via ORAL
  Filled 2022-04-13 (×5): qty 1

## 2022-04-13 MED ORDER — FENTANYL CITRATE PF 50 MCG/ML IJ SOSY
25.0000 ug | PREFILLED_SYRINGE | INTRAMUSCULAR | Status: DC | PRN
  Administered 2022-04-13: 50 ug via INTRAVENOUS

## 2022-04-13 MED ORDER — POTASSIUM CHLORIDE CRYS ER 10 MEQ PO TBCR
10.0000 meq | EXTENDED_RELEASE_TABLET | Freq: Every day | ORAL | Status: DC
  Administered 2022-04-14 – 2022-04-18 (×5): 10 meq via ORAL
  Filled 2022-04-13 (×5): qty 1

## 2022-04-13 MED ORDER — KETOROLAC TROMETHAMINE 30 MG/ML IJ SOLN
INTRAMUSCULAR | Status: AC
  Filled 2022-04-13: qty 1

## 2022-04-13 MED ORDER — VANCOMYCIN HCL 750 MG/150ML IV SOLN
750.0000 mg | INTRAVENOUS | Status: DC
  Administered 2022-04-14: 750 mg via INTRAVENOUS
  Filled 2022-04-13: qty 150

## 2022-04-13 MED ORDER — POVIDONE-IODINE 10 % EX SWAB
2.0000 | Freq: Once | CUTANEOUS | Status: AC
  Administered 2022-04-13: 2 via TOPICAL

## 2022-04-13 MED ORDER — HYDROCODONE-ACETAMINOPHEN 5-325 MG PO TABS: 1.0000 | ORAL_TABLET | ORAL | Status: DC | PRN

## 2022-04-13 MED ORDER — ATORVASTATIN CALCIUM 10 MG PO TABS
10.0000 mg | ORAL_TABLET | Freq: Every day | ORAL | Status: DC
  Administered 2022-04-13 – 2022-04-17 (×5): 10 mg via ORAL
  Filled 2022-04-13 (×6): qty 1

## 2022-04-13 MED ORDER — WARFARIN SODIUM 4 MG PO TABS
4.0000 mg | ORAL_TABLET | Freq: Once | ORAL | Status: AC
  Administered 2022-04-13: 4 mg via ORAL
  Filled 2022-04-13: qty 1

## 2022-04-13 MED ORDER — TRANEXAMIC ACID-NACL 1000-0.7 MG/100ML-% IV SOLN
1000.0000 mg | INTRAVENOUS | Status: AC
  Administered 2022-04-13: 1000 mg via INTRAVENOUS
  Filled 2022-04-13: qty 100

## 2022-04-13 MED ORDER — ACETAMINOPHEN 500 MG PO TABS
1000.0000 mg | ORAL_TABLET | Freq: Once | ORAL | Status: DC
  Filled 2022-04-13: qty 2

## 2022-04-13 MED ORDER — MIRABEGRON ER 25 MG PO TB24: 25.0000 mg | ORAL_TABLET | Freq: Every day | ORAL | Status: DC

## 2022-04-13 MED ORDER — SUGAMMADEX SODIUM 200 MG/2ML IV SOLN
INTRAVENOUS | Status: DC | PRN
  Administered 2022-04-13: 200 mg via INTRAVENOUS

## 2022-04-13 MED ORDER — ONDANSETRON HCL 4 MG/2ML IJ SOLN
INTRAMUSCULAR | Status: DC | PRN
  Administered 2022-04-13: 4 mg via INTRAVENOUS

## 2022-04-13 MED ORDER — DEXAMETHASONE SODIUM PHOSPHATE 10 MG/ML IJ SOLN
INTRAMUSCULAR | Status: AC
  Filled 2022-04-13: qty 1

## 2022-04-13 MED ORDER — CHLORHEXIDINE GLUCONATE CLOTH 2 % EX PADS
6.0000 | MEDICATED_PAD | Freq: Every day | CUTANEOUS | Status: AC
  Administered 2022-04-14 – 2022-04-18 (×5): 6 via TOPICAL

## 2022-04-13 MED ORDER — HYDROCODONE-ACETAMINOPHEN 7.5-325 MG PO TABS: 1.0000 | ORAL_TABLET | ORAL | Status: DC | PRN

## 2022-04-13 MED ORDER — FENTANYL CITRATE PF 50 MCG/ML IJ SOSY
PREFILLED_SYRINGE | INTRAMUSCULAR | Status: AC
  Filled 2022-04-13: qty 1

## 2022-04-13 MED ORDER — FUROSEMIDE 20 MG PO TABS
20.0000 mg | ORAL_TABLET | Freq: Every day | ORAL | Status: DC
  Administered 2022-04-13 – 2022-04-17 (×5): 20 mg via ORAL
  Filled 2022-04-13 (×5): qty 1

## 2022-04-13 MED ORDER — BUPIVACAINE-EPINEPHRINE (PF) 0.25% -1:200000 IJ SOLN
INTRAMUSCULAR | Status: AC
  Filled 2022-04-13: qty 30

## 2022-04-13 MED ORDER — CHLORHEXIDINE GLUCONATE 0.12 % MT SOLN: 15.0000 mL | Freq: Once | OROMUCOSAL | Status: DC

## 2022-04-13 MED ORDER — MIRTAZAPINE 15 MG PO TABS
15.0000 mg | ORAL_TABLET | Freq: Every day | ORAL | Status: DC
  Administered 2022-04-13 – 2022-04-17 (×5): 15 mg via ORAL
  Filled 2022-04-13 (×5): qty 1

## 2022-04-13 MED ORDER — PROPOFOL 10 MG/ML IV BOLUS
INTRAVENOUS | Status: DC | PRN
  Administered 2022-04-13: 120 mg via INTRAVENOUS

## 2022-04-13 MED ORDER — LEVOTHYROXINE SODIUM 50 MCG PO TABS
50.0000 ug | ORAL_TABLET | Freq: Every day | ORAL | Status: DC
  Administered 2022-04-14 – 2022-04-18 (×5): 50 ug via ORAL
  Filled 2022-04-13 (×5): qty 1

## 2022-04-13 MED ORDER — FENTANYL CITRATE (PF) 100 MCG/2ML IJ SOLN
INTRAMUSCULAR | Status: DC | PRN
  Administered 2022-04-13: 50 ug via INTRAVENOUS

## 2022-04-13 MED ORDER — ROCURONIUM BROMIDE 10 MG/ML (PF) SYRINGE
PREFILLED_SYRINGE | INTRAVENOUS | Status: AC
  Filled 2022-04-13: qty 10

## 2022-04-13 MED ORDER — CEFAZOLIN SODIUM-DEXTROSE 2-4 GM/100ML-% IV SOLN
2.0000 g | INTRAVENOUS | Status: AC
  Administered 2022-04-13: 2 g via INTRAVENOUS
  Filled 2022-04-13: qty 100

## 2022-04-13 MED ORDER — MUPIROCIN 2 % EX OINT
1.0000 | TOPICAL_OINTMENT | Freq: Two times a day (BID) | CUTANEOUS | Status: AC
  Administered 2022-04-13 – 2022-04-18 (×10): 1 via NASAL
  Filled 2022-04-13: qty 22

## 2022-04-13 MED ORDER — DOCUSATE SODIUM 100 MG PO CAPS
100.0000 mg | ORAL_CAPSULE | Freq: Two times a day (BID) | ORAL | Status: DC
  Administered 2022-04-15 – 2022-04-18 (×3): 100 mg via ORAL
  Filled 2022-04-13 (×6): qty 1

## 2022-04-13 MED ORDER — ONDANSETRON HCL 4 MG PO TABS: 4.0000 mg | ORAL_TABLET | Freq: Four times a day (QID) | ORAL | Status: DC | PRN

## 2022-04-13 MED ORDER — ONDANSETRON HCL 4 MG/2ML IJ SOLN: 4.0000 mg | Freq: Four times a day (QID) | INTRAMUSCULAR | Status: DC | PRN

## 2022-04-13 MED ORDER — VANCOMYCIN HCL IN DEXTROSE 1-5 GM/200ML-% IV SOLN
1000.0000 mg | INTRAVENOUS | Status: AC
  Administered 2022-04-13: 1000 mg via INTRAVENOUS
  Filled 2022-04-13: qty 200

## 2022-04-13 MED ORDER — EPHEDRINE SULFATE-NACL 50-0.9 MG/10ML-% IV SOSY
PREFILLED_SYRINGE | INTRAVENOUS | Status: DC | PRN
  Administered 2022-04-13 (×5): 5 mg via INTRAVENOUS

## 2022-04-13 MED ORDER — SENNA 8.6 MG PO TABS
1.0000 | ORAL_TABLET | Freq: Two times a day (BID) | ORAL | Status: DC
  Administered 2022-04-15 – 2022-04-18 (×4): 8.6 mg via ORAL
  Filled 2022-04-13 (×8): qty 1

## 2022-04-13 MED ORDER — ROCURONIUM BROMIDE 10 MG/ML (PF) SYRINGE
PREFILLED_SYRINGE | INTRAVENOUS | Status: DC | PRN
  Administered 2022-04-13: 70 mg via INTRAVENOUS

## 2022-04-13 MED ORDER — SODIUM CHLORIDE 0.9 % IR SOLN
Status: DC | PRN
  Administered 2022-04-13: 3000 mL

## 2022-04-13 MED ORDER — PROPOFOL 10 MG/ML IV BOLUS
INTRAVENOUS | Status: AC
  Filled 2022-04-13: qty 20

## 2022-04-13 MED ORDER — DEXAMETHASONE SODIUM PHOSPHATE 10 MG/ML IJ SOLN
INTRAMUSCULAR | Status: DC | PRN
  Administered 2022-04-13: 4 mg via INTRAVENOUS

## 2022-04-13 MED ORDER — PANTOPRAZOLE SODIUM 40 MG PO TBEC
40.0000 mg | DELAYED_RELEASE_TABLET | Freq: Every morning | ORAL | Status: DC
  Administered 2022-04-14 – 2022-04-18 (×5): 40 mg via ORAL
  Filled 2022-04-13 (×5): qty 1

## 2022-04-13 MED ORDER — METOCLOPRAMIDE HCL 5 MG PO TABS: 5.0000 mg | ORAL_TABLET | Freq: Three times a day (TID) | ORAL | Status: DC | PRN

## 2022-04-13 MED ORDER — MORPHINE SULFATE (PF) 2 MG/ML IV SOLN: 0.5000 mg | INTRAVENOUS | Status: DC | PRN

## 2022-04-13 SURGICAL SUPPLY — 51 items
BAG COUNTER SPONGE SURGICOUNT (BAG) IMPLANT
BAG ZIPLOCK 12X15 (MISCELLANEOUS) IMPLANT
BLADE SAW SGTL 11.0X1.19X90.0M (BLADE) IMPLANT
CHLORAPREP W/TINT 26 (MISCELLANEOUS) ×2 IMPLANT
COVER PERINEAL POST (MISCELLANEOUS) ×2 IMPLANT
COVER SURGICAL LIGHT HANDLE (MISCELLANEOUS) ×2 IMPLANT
DERMABOND ADVANCED .7 DNX12 (GAUZE/BANDAGES/DRESSINGS) ×2 IMPLANT
DRAPE IMP U-DRAPE 54X76 (DRAPES) ×2 IMPLANT
DRAPE SHEET LG 3/4 BI-LAMINATE (DRAPES) ×6 IMPLANT
DRAPE STERI IOBAN 125X83 (DRAPES) ×2 IMPLANT
DRAPE U-SHAPE 47X51 STRL (DRAPES) ×4 IMPLANT
DRESSING PEEL AND PLC PRVNA 13 (GAUZE/BANDAGES/DRESSINGS) ×1 IMPLANT
DRSG AQUACEL AG ADV 3.5X10 (GAUZE/BANDAGES/DRESSINGS) ×2 IMPLANT
DRSG PEEL AND PLACE PREVENA 13 (GAUZE/BANDAGES/DRESSINGS) ×2
ELECT BLADE TIP CTD 4 INCH (ELECTRODE) ×2 IMPLANT
EVACUATOR DRAINAGE 10X20 100CC (DRAIN) ×1 IMPLANT
EVACUATOR DRAINAGE 7X20 100CC (MISCELLANEOUS) IMPLANT
EVACUATOR SILICONE 100CC (DRAIN) ×2
EVACUATOR SILICONE 100CC (MISCELLANEOUS)
GAUZE 4X4 16PLY ~~LOC~~+RFID DBL (SPONGE) ×2 IMPLANT
GLOVE BIO SURGEON STRL SZ8.5 (GLOVE) ×4 IMPLANT
GLOVE BIOGEL M 7.0 STRL (GLOVE) ×2 IMPLANT
GLOVE BIOGEL PI IND STRL 7.5 (GLOVE) ×4 IMPLANT
GLOVE BIOGEL PI IND STRL 8 (GLOVE) ×2 IMPLANT
GLOVE BIOGEL PI IND STRL 8.5 (GLOVE) ×2 IMPLANT
GLOVE SURG LX STRL 7.5 STRW (GLOVE) ×4 IMPLANT
GOWN SPEC L3 XXLG W/TWL (GOWN DISPOSABLE) ×4 IMPLANT
HOLDER FOLEY CATH W/STRAP (MISCELLANEOUS) ×2 IMPLANT
HOOD PEEL AWAY FLYTE STAYCOOL (MISCELLANEOUS) ×8 IMPLANT
KIT DRSG PREVENA PLUS 7DAY 125 (MISCELLANEOUS) ×1 IMPLANT
NDL SPNL 18GX3.5 QUINCKE PK (NEEDLE) ×1 IMPLANT
NEEDLE SPNL 18GX3.5 QUINCKE PK (NEEDLE) ×2 IMPLANT
PACK ANTERIOR HIP CUSTOM (KITS) ×2 IMPLANT
PENCIL SMOKE EVACUATOR (MISCELLANEOUS) IMPLANT
SAW OSC TIP CART 19.5X105X1.3 (SAW) ×2 IMPLANT
SEALER BIPOLAR AQUA 6.0 (INSTRUMENTS) ×2 IMPLANT
SOLUTION PRONTOSAN WOUND 350ML (IRRIGATION / IRRIGATOR) ×2 IMPLANT
SUT ETHIBOND NAB CT1 #1 30IN (SUTURE) ×2 IMPLANT
SUT ETHILON 2 0 PSLX (SUTURE) ×2 IMPLANT
SUT ETHILON 3 0 FSL (SUTURE) ×1 IMPLANT
SUT MNCRL AB 3-0 PS2 18 (SUTURE) ×2 IMPLANT
SUT MNCRL AB 4-0 PS2 18 (SUTURE) ×2 IMPLANT
SUT MON AB 2-0 CT1 36 (SUTURE) ×4 IMPLANT
SUT STRATAFIX PDO 1 14 VIOLET (SUTURE) ×2
SUT STRATFX PDO 1 14 VIOLET (SUTURE) ×2
SUT VIC AB 2-0 CT1 27 (SUTURE) ×4
SUT VIC AB 2-0 CT1 TAPERPNT 27 (SUTURE) ×4 IMPLANT
SUTURE STRATFX PDO 1 14 VIOLET (SUTURE) ×2 IMPLANT
SYR 50ML LL SCALE MARK (SYRINGE) ×2 IMPLANT
TRAY FOLEY MTR SLVR 16FR STAT (SET/KITS/TRAYS/PACK) ×1 IMPLANT
WATER STERILE IRR 1000ML POUR (IV SOLUTION) ×2 IMPLANT

## 2022-04-13 NOTE — Anesthesia Postprocedure Evaluation (Signed)
Anesthesia Post Note  Patient: Craig Snow.  Procedure(s) Performed: IRRIGATION AND DEBRIDEMENT SUPERFICIAL HIP (Left)     Patient location during evaluation: PACU Anesthesia Type: General Level of consciousness: awake and alert Pain management: pain level controlled Vital Signs Assessment: post-procedure vital signs reviewed and stable Respiratory status: spontaneous breathing, nonlabored ventilation and respiratory function stable Cardiovascular status: blood pressure returned to baseline and stable Postop Assessment: no apparent nausea or vomiting Anesthetic complications: no   No notable events documented.  Last Vitals:  Vitals:   04/13/22 1330 04/13/22 1356  BP: 130/60 139/66  Pulse: 62 (!) 56  Resp:  12  Temp:    SpO2: 100% 95%    Last Pain:  Vitals:   04/13/22 1356  TempSrc:   PainSc: [place] Darlin Stenseth

## 2022-04-13 NOTE — Interval H&P Note (Signed)
History and Physical Interval Note:  04/13/2022 10:42 AM  [place] Snow.  has presented today for surgery, with the diagnosis of Wound dehisance left hip.  The various methods of treatment have been discussed with the patient and family. After consideration of risks, benefits and other options for treatment, the patient has consented to  Procedure(s) with comments: IRRIGATION AND DEBRIDEMENT HIP (Left) - 120 POSSIBLE HEAD BALL EXCHANGE (Left) - 120 as a surgical intervention.  The patient's history has been reviewed, patient examined, no change in status, stable for surgery.  I have reviewed the patient's chart and labs.  Questions were answered to the patient's satisfaction.     Hilton Cork Ritamarie Arkin

## 2022-04-13 NOTE — Plan of Care (Signed)
  Problem: Education: Goal: Knowledge of General Education information will improve Description: Including pain rating scale, medication(s)/side effects and non-pharmacologic comfort measures Outcome: Progressing   Problem: Health Behavior/Discharge Planning: Goal: Ability to manage health-related needs will improve Outcome: Progressing   Problem: Clinical Measurements: Goal: Ability to maintain clinical measurements within normal limits will improve Outcome: Progressing   Problem: Nutrition: Goal: Adequate nutrition will be maintained Outcome: Progressing   Problem: Coping: Goal: Level of anxiety will decrease Outcome: Progressing   Problem: Elimination: Goal: Will not experience complications related to urinary retention Outcome: Progressing   Problem: Pain Managment: Goal: General experience of comfort will improve Outcome: Progressing   Problem: Safety: Goal: Ability to remain free from injury will improve Outcome: Progressing   Problem: Skin Integrity: Goal: Risk for impaired skin integrity will decrease Outcome: Progressing

## 2022-04-13 NOTE — Op Note (Signed)
OPERATIVE REPORT   04/13/2022  12:31 PM  PATIENT:  Craig Snow.   SURGEON:  Bertram Savin, MD  ASSISTANT:  Larene Pickett, PA-C.   PREOPERATIVE DIAGNOSIS:  Wound dehisance left hip  POSTOPERATIVE DIAGNOSIS:  Same.  PROCEDURE:  1. Excisional debridement of skin and subcutaneous tissue left hip. 2. Fluoroscopic guided left hip aspiration. 3. Closure of wound totaling 4 cm. 4. Application of negative pressure incisional dressing.  ANESTHESIA:   GETA.  ANTIBIOTICS: 1 g vancomycin.  IMPLANTS: None.  SPECIMENS:  1.  Left hip superficial wound swab for aerobic and anaerobic culture. 2.  Left hip synovial fluid for stat cell count with differential and aerobic/anaerobic culture.  TUBES AND DRAINS: 1.  Negative pressure dressing at 75 mmHg. 2.  10 mm flat JP drain in the subcutaneous tissue.  COMPLICATIONS: None.  DISPOSITION: Stable to PACU.  SURGICAL INDICATIONS:  Craig Suto. is a 88 y.o. male with multiple medical problems including dementia who underwent left hip hemiarthroplasty for displaced femoral neck fracture on 10/23/2021.  Patient was doing very well with his recovery.  He came to the office earlier this week with new onset subcentimeter dehiscence with serous drainage.  He was indicated for left hip debridement, possible head ball exchange.  Of note, patient had oral surgery procedure on Monday and was placed on amoxicillin.  The risks, benefits, and alternatives were discussed with the patient. There are risks associated with the surgery including, but not limited to, problems with anesthesia (death), infection, instability (giving out of the joint), dislocation, differences in leg length/angulation/rotation, fracture of bones, loosening or failure of implants, hematoma (blood accumulation) which may require surgical drainage, blood clots, pulmonary embolism, nerve injury (foot drop and lateral thigh numbness), and blood vessel injury. The patient understands  these risks and elects to proceed.   PROCEDURE IN DETAIL: The patient was identified in the holding area using 2 identifiers.  The surgical site was marked by myself.  He was taken to the operating room, and general anesthesia was induced on the stretcher.  He was then transferred to the [facility] table.  All bony prominences were well-padded.  The left hip was prepped and draped in the normal sterile surgical fashion.  Timeout was called, verifying site and site of surgery.  He did receive IV antibiotics within 60 minutes of beginning the procedure.  I began by examining the left hip.  In the center of the incision he had a subcentimeter skin dehiscence.  Using a #10 blade I performed an excisional debridement of this area in an elliptical fashion.  The incision was extended a little proximally and distally.  There was no underlying fluid collection.  There was no purulent material.  There was some nonviable subcutaneous tissue, and I swabbed this area for aerobic and anaerobic culture.  There was a contained wound bed of a couple centimeters medially, laterally, proximally, and distally.  I performed an excisional debridement of this entire area with a Cobb and rongeur.  The wound was then copiously irrigated with 3 L of normal saline using pulsatile lavage.  I reexplored the wound.  The fascial repair was located well medial to the wound.  I did not further explore this area, as it would have required more sharp surgical dissection.  I then brought in fluoroscopy.  Well medial and distal to the involved area, I inserted an 18-gauge spinal needle under live fluoroscopic guidance to the junction of the head ball and neck.  I then  aspirated 10 cc of slightly hazy straw-colored joint fluid.  The joint fluid was sent for stat cell count with differential as well as aerobic and anaerobic culture.  Due to the benign nature of the joint fluid, I decided to close the wound.  Through a separate stab incision over the  lateral thigh, a 10 mm flat JP drain was inserted into the subcutaneous tissue and sewn in with 3-0 nylon suture.  The wound was then closed with 2-0 nylon vertical mattress sutures and skin staples.  13 cm off-the-shelf Prevena dressing was applied and hooked up to suction at 75 mmHg.  There was no leak.  Sponge, needle, and instrument counts were correct at the end of the case x2.  There were no known complications.  POSTOPERATIVE PLAN: Patient will be admitted to the orthopedic floor.  He may weight-bear as tolerated with a walker.  We will continue his preoperative dental amoxicillin.  We will place him on vancomycin until wound cultures are available.  We will resume his Coumadin without bridge.  Mobilize out of bed with PT.  Upon discharge, the house VAC suction unit will need to be exchanged for a portable Prevena suction unit.  He will need to follow-up in the office within 7 days of discharge for removal of the JP drain and removal of the negative pressure dressing.

## 2022-04-13 NOTE — Anesthesia Procedure Notes (Signed)
Procedure Name: Intubation Date/Time: 04/13/2022 11:06 AM  Performed by: Milford Cage, CRNAPre-anesthesia Checklist: Patient identified, Emergency Drugs available, Suction available and Patient being monitored Patient Re-evaluated:Patient Re-evaluated prior to induction Oxygen Delivery Method: Circle system utilized Preoxygenation: Pre-oxygenation with 100% oxygen Induction Type: IV induction Ventilation: Mask ventilation without difficulty Laryngoscope Size: Glidescope and 4 Grade View: Grade I Tube type: Oral Tube size: 7.5 mm Number of attempts: 1 Airway Equipment and Method: Stylet Placement Confirmation: ETT inserted through vocal cords under direct vision, positive ETCO2 and breath sounds checked- equal and bilateral Secured at: 23 cm Tube secured with: Tape Dental Injury: Teeth and Oropharynx as per pre-operative assessment  Comments: 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

## 2022-04-13 NOTE — Transfer of Care (Signed)
Immediate Anesthesia Transfer of Care Note  Patient: Craig Snow.  Procedure(s) Performed: IRRIGATION AND DEBRIDEMENT SUPERFICIAL HIP (Left)  Patient Location: PACU  Anesthesia Type:General  Level of Consciousness: drowsy  Airway & Oxygen Therapy: Patient Spontanous Breathing and Patient connected to face mask oxygen  Post-op Assessment: Report given to RN and Post -op Vital signs reviewed and stable  Post vital signs: Reviewed and stable  Last Vitals:  Vitals Value Taken Time  BP 139/58 04/13/22 1233  Temp    Pulse 54 04/13/22 1235  Resp 12 04/13/22 1236  SpO2 100 % 04/13/22 1235  Vitals shown include unvalidated device data.  Last Pain:  Vitals:   04/13/22 0859  TempSrc:   PainSc: 5          Complications: No notable events documented.

## 2022-04-13 NOTE — Discharge Instructions (Addendum)
Dr. Rod Can Joint Replacement Specialist [facility] [address], [place], [place] [address] [phone]   TOTAL HIP REPLACEMENT POSTOPERATIVE DIRECTIONS    Hip Rehabilitation, Guidelines Following Surgery   WEIGHT BEARING Weight bearing as tolerated with assist device (walker, cane, etc) as directed, use it as long as suggested by your surgeon or therapist, typically at least 4-6 weeks.  The results of a hip operation are greatly improved after range of motion and muscle strengthening exercises. Follow all safety measures which are given to protect your hip. If any of these exercises cause increased pain or swelling in your joint, decrease the amount until you are comfortable again. Then slowly increase the exercises. Call your caregiver if you have problems or questions.   HOME CARE INSTRUCTIONS  Most of the following instructions are designed to prevent the dislocation of your new hip.  Remove items at home which could result in a fall. This includes throw rugs or furniture in walking pathways.  Continue medications as instructed at time of discharge. You may have some home medications which will be placed on hold until you complete the course of blood thinner medication. Do not remove your dressing. Keep dressing clean and dry.  Do not put on socks or shoes without following the instructions of your caregivers.   Sit on chairs with arms. Use the chair arms to help push yourself up when arising.  Arrange for the use of a toilet seat elevator so you are not sitting low.  Walk with walker as instructed.  You may resume a sexual relationship in one month or when given the OK by your caregiver.  Use walker as long as suggested by your caregivers.  You may put full weight on your legs and walk as much as is comfortable. Avoid periods of inactivity such as sitting longer than an hour when not asleep. This helps prevent blood clots.  You may return  to work once you are cleared by Engineer, production.  Do not drive a car for 6 weeks or until released by your surgeon.  Do not drive while taking narcotics.  Wear elastic stockings for two weeks following surgery during the day but you may remove then at night.  Make sure you keep all of your appointments after your operation with all of your doctors and caregivers. You should call the office at the above phone number and make an appointment for approximately two weeks after the date of your surgery. Please pick up a stool softener and laxative for home use as long as you are requiring pain medications. ICE to the affected hip every three hours for 30 minutes at a time and then as needed for pain and swelling. Continue to use ice on the hip for pain and swelling from surgery. You may notice swelling that will progress down to the foot and ankle.  This is normal after surgery.  Elevate the leg when you are not up walking on it.   Continue to use the breathing machine which will help keep your temperature down.  It is common for your temperature to cycle up and down following surgery, especially at night when you are not up moving around and exerting yourself.  The breathing machine keeps your lungs expanded and your temperature down.  RANGE OF MOTION AND STRENGTHENING EXERCISES  These exercises are designed to help you keep full movement of your hip joint. Follow your caregiver's or physical therapist's instructions. Perform all exercises about fifteen  times, three times per day or as directed. Exercise both hips, even if you have had only one joint replacement. These exercises can be done on a training (exercise) mat, on the floor, on a table or on a bed. Use whatever works the best and is most comfortable for you. Use music or television while you are exercising so that the exercises are a pleasant break in your day. This will make your life better with the exercises acting as a break in routine you can look  forward to.  Lying on your back, slowly slide your foot toward your buttocks, raising your knee up off the floor. Then slowly slide your foot back down until your leg is straight again.  Lying on your back spread your legs as far apart as you can without causing discomfort.  Lying on your side, raise your upper leg and foot straight up from the floor as far as is comfortable. Slowly lower the leg and repeat.  Lying on your back, tighten up the muscle in the front of your thigh (quadriceps muscles). You can do this by keeping your leg straight and trying to raise your heel off the floor. This helps strengthen the largest muscle supporting your knee.  Lying on your back, tighten up the muscles of your buttocks both with the legs straight and with the knee bent at a comfortable angle while keeping your heel on the floor.   SKILLED REHAB INSTRUCTIONS: If the patient is transferred to a skilled rehab facility following release from the hospital, a list of the current medications will be sent to the facility for the patient to continue.  When discharged from the skilled rehab facility, please have the facility set up the patient's [place] prior to being released. Also, the skilled facility will be responsible for providing the patient with their medications at time of release from the facility to include their pain medication and their blood thinner medication. If the patient is still at the rehab facility at time of the two week follow up appointment, the skilled rehab facility will also need to assist the patient in arranging follow up appointment in our office and any transportation needs.  POST-OPERATIVE OPIOID TAPER INSTRUCTIONS: It is important to wean off of your opioid medication as soon as possible. If you do not need pain medication after your surgery it is ok to stop day one. Opioids include: Codeine, Hydrocodone(Norco, Vicodin), Oxycodone(Percocet, oxycontin) and hydromorphone  amongst others.  Long term and even short term use of opiods can cause: Increased pain response Dependence Constipation Depression Respiratory depression And more.  Withdrawal symptoms can include Flu like symptoms Nausea, vomiting And more Techniques to manage these symptoms Hydrate well Eat regular healthy meals Stay active Use relaxation techniques(deep breathing, meditating, yoga) Do Not substitute Alcohol to help with tapering If you have been on opioids for less than two weeks and do not have pain than it is ok to stop all together.  Plan to wean off of opioids This plan should start within one week post op of your joint replacement. Maintain the same interval or time between taking each dose and first decrease the dose.  Cut the total daily intake of opioids by one tablet each day Next start to increase the time between doses. The last dose that should be eliminated is the evening dose.    MAKE SURE YOU:  Understand these instructions.  Will watch your condition.  Will get help right away if you  are not doing well or get worse.  Pick up stool softner and laxative for home use following surgery while on pain medications. Do not remove your dressing. Keep dressing clean and dry.  Please charge your prevena wound vac nightly.  Continue to use ice for pain and swelling after surgery. Do not use any lotions or creams on the incision until instructed by your surgeon. Total Hip Protocol

## 2022-04-13 NOTE — Progress Notes (Signed)
ANTICOAGULATION CONSULT NOTE - Initial Consult  Pharmacy Consult for Warfarin Indication: atrial fibrillation  Allergies  Allergen Reactions   Aspirin Other (See Comments)    Unknown reaction - listed on [facility] 07/30/21   Ibuprofen Anaphylaxis   Clindamycin/Lincomycin Other (See Comments)    Unknown reaction - listed on [facility] 07/30/21   Other Other (See Comments)    Unknown reaction to opioids - listed on [facility] 07/30/21 hallucinations   Sulfa Antibiotics Other (See Comments)    Unknown reaction - listed on [facility] 07/30/21   Tramadol Other (See Comments)    Hallucinations/ Currently taking for severe pain    Patient Measurements: Height: '5\' 11"'$  (180.3 cm) Weight: 61.8 kg (136 lb 3.2 oz) IBW/kg (Calculated) : 75.3  Vital Signs: Temp: 96.7 F (35.9 C) (10/11 1245) Temp Source: Oral (10/11 0832) BP: 139/66 (10/11 1356) Pulse Rate: 56 (10/11 1356)  Labs: Recent Labs    04/13/22 0921  HGB 11.8*  HCT 36.9*  PLT 150  APTT 36  LABPROT 16.1*  INR 1.3*  CREATININE 1.10    Estimated Creatinine Clearance: 32.8 mL/min (by C-G formula based on SCr of 1.1 mg/dL).   Medical History: Past Medical History:  Diagnosis Date   Acquired thrombophilia ([place])    Acute lower UTI 07/30/2021   Arrhythmia    Atrial fibrillation ([facility])    Bladder cancer (H[facility])    BPH (benign prostatic hyperplasia)    Candidiasis of penis 07/30/2021   Chronic ear infection, left    Community acquired pneumonia 07/30/2021   aspiration   GERD (gastroesophageal reflux disease)    Headache    History of radiation therapy    oral cancer   Hyperlipidemia    Hypertension    Indwelling Foley catheter present    Neoplasm of palate 2008   Poor dentition    Ruptured eardrum, left    absent eardrum   Supratherapeutic INR 07/30/2021   Thyrotoxicosis     Medications:  Scheduled:   amoxicillin  500 mg Oral Q8H   atorvastatin  10 mg Oral QHS   docusate sodium  100 mg Oral BID   fentaNYL       furosemide  20 mg  Oral QHS   [START ON 04/14/2022] levothyroxine  50 mcg Oral Q0600   [START ON 04/14/2022] liothyronine  5 mcg Oral q morning   melatonin  5 mg Oral QHS   metoprolol tartrate  25 mg Oral BID   mirtazapine  15 mg Oral QHS   [START ON 04/14/2022] pantoprazole  40 mg Oral q morning   [START ON 04/14/2022] potassium chloride  10 mEq Oral Daily   senna  1 tablet Oral BID   warfarin  4 mg Oral ONCE-1600   Warfarin - Pharmacist Dosing Inpatient   Does not apply q1600   Infusions:   sodium chloride 50 mL/hr at 04/13/22 1417   [START ON 04/14/2022] vancomycin     PRN: [START ON 04/14/2022] acetaminophen, fentaNYL, HYDROcodone-acetaminophen, HYDROcodone-acetaminophen, metoCLOPramide **OR** metoCLOPramide (REGLAN) injection, morphine injection, ondansetron **OR** ondansetron (ZOFRAN) IV, polyethylene glycol  Home warfarin dose reported as '3mg'$  daily except '2mg'$  Tues/Thurs - last dose taken 10/9 - INR 1.3 today  Assessment: 88 yo male on chronic warfarin for afib.  Pharmacy consulted to dose s/p L hip I&D.  Goal of Therapy:  INR 2-3 Monitor platelets by anticoagulation protocol: Yes   Plan:  Warfarin '4mg'$  PO x 1 today Daily PT/INR Vancomycin '750mg'$  IV q24h for estimated AUC 449, SCr 1.1, Vd 0.72  Peggyann Juba, PharmD, BCPS Pharmacy: [phone] 04/13/2022,2:42 PM

## 2022-04-13 NOTE — H&P (Signed)
PREOPERATIVE H&P  Chief Complaint: Wound dehisance left hip  HPI: Craig Snow. is a 88 y.o. male who presents for preoperative history and physical with a diagnosis of Wound dehisance left hip.  The patient underwent left hip hemiarthroplasty anterior approach for displaced femoral neck fracture on 10/23/2021.  He came into the office this week with a couple day history of wound dehiscence and drainage.  We discussed debridement of the left hip, exploration of the wound, possible head ball and liner exchange.  He has elected for surgical management.   Past Medical History:  Diagnosis Date   Acquired thrombophilia ([place])    Acute lower UTI 07/30/2021   Arrhythmia    Atrial fibrillation (HCC)    Bladder cancer (HCC)    BPH (benign prostatic hyperplasia)    Candidiasis of penis 07/30/2021   Chronic ear infection, left    Community acquired pneumonia 07/30/2021   aspiration   GERD (gastroesophageal reflux disease)    Headache    History of radiation therapy    oral cancer   Hyperlipidemia    Hypertension    Indwelling Foley catheter present    Neoplasm of palate 2008   Poor dentition    Ruptured eardrum, left    absent eardrum   Supratherapeutic INR 07/30/2021   Thyrotoxicosis    Past Surgical History:  Procedure Laterality Date   ANTERIOR APPROACH HEMI HIP ARTHROPLASTY Left 10/23/2021   Procedure: ANTERIOR APPROACH HEMI HIP ARTHROPLASTY;  Surgeon: Rod Can, MD;  Location: [facility];  Service: Orthopedics;  Laterality: Left;   MOUTH SURGERY     cancer   Social History   Socioeconomic History   Marital status: Widowed    Spouse name: Not on file   Number of children: Not on file   Years of education: Not on file   Highest education level: Not on file  Occupational History   Not on file  Tobacco Use   Smoking status: Former    Types: Cigarettes    Quit date: 59    Years since quitting: 50.8   Smokeless tobacco: Never  Vaping Use   Vaping Use: Never used   Substance and Sexual Activity   Alcohol use: Not Currently    Comment: rare   Drug use: Not Currently   Sexual activity: Not on file  Other Topics Concern   Not on file  Social History Narrative   Not on file   Social Determinants of Health   Financial Resource Strain: Not on file  Food Insecurity: Not on file  Transportation Needs: Not on file  Physical Activity: Not on file  Stress: Not on file  Social Connections: Not on file   Family History  Problem Relation Age of Onset   Cancer Father    Allergies  Allergen Reactions   Aspirin Other (See Comments)    Unknown reaction - listed on [facility] 07/30/21   Ibuprofen Anaphylaxis   Clindamycin/Lincomycin Other (See Comments)    Unknown reaction - listed on [facility] 07/30/21   Other Other (See Comments)    Unknown reaction to opioids - listed on [facility] 07/30/21 hallucinations   Sulfa Antibiotics Other (See Comments)    Unknown reaction - listed on [facility] 07/30/21   Tramadol Other (See Comments)    Hallucinations/ Currently taking for severe pain   Prior to Admission medications   Medication Sig Start Date End Date Taking? Authorizing Provider  acetaminophen (TYLENOL) 650 MG CR tablet Take 650-1,300 mg by mouth every 8 (eight) hours  as needed for pain.   Yes [provider]  amoxicillin (AMOXIL) 500 MG capsule Take 500 mg by mouth in the morning, at noon, in the evening, and at bedtime.   Yes [provider]  atorvastatin (LIPITOR) 10 MG tablet Take 10 mg by mouth at bedtime.   Yes [provider]  ferrous sulfate 325 (65 FE) MG tablet Take 325 mg by mouth daily with breakfast.   Yes [provider]  furosemide (LASIX) 20 MG tablet Take 20 mg by mouth at bedtime.   Yes [provider]  levothyroxine (SYNTHROID) 50 MCG tablet Take 50 mcg by mouth every morning.   Yes [provider]  liothyronine (CYTOMEL) 5 MCG tablet Take 5 mcg by mouth every morning.   Yes [provider]   melatonin 5 MG TABS Take 5 mg by mouth at bedtime.   Yes [provider]  metoprolol tartrate (LOPRESSOR) 25 MG tablet Take 1 tablet (25 mg total) by mouth 2 (two) times daily. 08/06/21  Yes Shawna Clamp, MD  mirtazapine (REMERON) 15 MG tablet Take 15 mg by mouth at bedtime.   Yes [provider]  pantoprazole (PROTONIX) 40 MG tablet Take 40 mg by mouth every morning.   Yes [provider]  potassium chloride (KLOR-CON) 10 MEQ tablet Take 10 mEq by mouth daily.   Yes [provider]  PRESCRIPTION MEDICATION Place 1 Application into the left ear 2 (two) times daily. Compound '@Custom'$  care Pharmacy Refrigerated capsule Ciprofloxacin/clotrimazole/Boric Acid and dexamethasone   Yes [provider]  traMADol (ULTRAM) 50 MG tablet Take 25 mg by mouth daily as needed for severe pain.   Yes [provider]  warfarin (COUMADIN) 2 MG tablet Take 2 mg by mouth See admin instructions. Take one tablet (2 mg) by mouth in the morning on Thursday and Tuesday (take 3 mg on Monday, Wednesday, Friday,Saturday and  Sunday in the morning)   Yes [provider]  warfarin (COUMADIN) 3 MG tablet Take 3 mg by mouth See admin instructions. Take one tablet (3 mg) by mouth on Monday, Wednesday, Friday, Saturday and Sunday morning (take 2 mg on Thursday and Tuesday morning)   Yes [provider]  mirabegron ER (MYRBETRIQ) 25 MG TB24 tablet Take 1 tablet (25 mg total) by mouth daily. Patient not taking: Reported on 04/12/2022 08/07/21   Shawna Clamp, MD  Vitamin D, Ergocalciferol, (DRISDOL) 1.25 MG (50000 UNIT) CAPS capsule Take 1 capsule (50,000 Units total) by mouth every Tuesday. Patient not taking: Reported on 04/12/2022 11/02/21   Caren Griffins, MD     Positive ROS: All other systems have been reviewed and were otherwise negative with the exception of those mentioned in the HPI and as above.  Physical Exam: General: Alert, no acute  distress Cardiovascular: No pedal edema Respiratory: No cyanosis, no use of accessory musculature GI: No organomegaly, abdomen is soft and non-tender Skin: No lesions in the area of chief complaint Neurologic: Sensation intact distally Psychiatric: Patient is pleasantly confused lymphatic: No axillary or cervical lymphadenopathy  MUSCULOSKELETAL: Examination of the left hip reveals that the incision is healed except for a small subcentimeter region near the center aspect.  There is full-thickness dehiscence of the wound in this area.  Assessment: Wound dehisance left hip  Plan: Plan for Procedure(s): IRRIGATION AND DEBRIDEMENT HIP POSSIBLE HEAD BALL EXCHANGE  The risks, benefits, and alternatives were discussed with the patient. There are risks associated with the surgery including, but not limited to,  problems with anesthesia (death), infection, instability (giving out of the joint), dislocation, differences in leg length/angulation/rotation, fracture of bones, loosening or failure of implants, hematoma (blood accumulation) which may require surgical drainage, blood clots, pulmonary embolism, nerve injury (foot drop and lateral thigh numbness), and blood vessel injury. The patient understands these risks and elects to proceed.  Bertram Savin, MD [phone]   04/13/2022 8:16 AM

## 2022-04-14 ENCOUNTER — Encounter (HOSPITAL_COMMUNITY): Payer: Self-pay | Admitting: Orthopedic Surgery

## 2022-04-14 LAB — PROTIME-INR
INR: 1.3 — ABNORMAL HIGH (ref 0.8–1.2)
Prothrombin Time: 16.1 seconds — ABNORMAL HIGH (ref 11.4–15.2)

## 2022-04-14 MED ORDER — CEFAZOLIN SODIUM-DEXTROSE 2-4 GM/100ML-% IV SOLN
2.0000 g | Freq: Three times a day (TID) | INTRAVENOUS | Status: DC
  Administered 2022-04-14 – 2022-04-18 (×12): 2 g via INTRAVENOUS
  Filled 2022-04-14 (×12): qty 100

## 2022-04-14 MED ORDER — WARFARIN SODIUM 4 MG PO TABS
4.0000 mg | ORAL_TABLET | Freq: Once | ORAL | Status: AC
  Administered 2022-04-14: 4 mg via ORAL
  Filled 2022-04-14: qty 1

## 2022-04-14 NOTE — Progress Notes (Signed)
Pharmacy Antibiotic Note  Craig Stapel. is a 88 y.o. male admitted on 04/13/2022 with wound dehiscence left hip.  Initially started on vancomycin 10/11.  Today, Vancomycin discontinued by Surgery and Pharmacy has been consulted for Ancef dosing due to possible intolerance (hallucinations per family member as told to me on telephone) to vancomycin.  Plan: Ancef 2 g IV every 8 hours Monitor clinical progress, renal function F/U C&S, abx deescalation / LOT   Height: '5\' 11"'$  (180.3 cm) Weight: 61.8 kg (136 lb 3.2 oz) IBW/kg (Calculated) : 75.3  Temp (24hrs), Avg:98 F (36.7 C), Min:97.4 F (36.3 C), Max:98.4 F (36.9 C)  Recent Labs  Lab 04/13/22 0921  WBC 8.7  CREATININE 1.10    Estimated Creatinine Clearance: 32.8 mL/min (by C-G formula based on SCr of 1.1 mg/dL).    Allergies  Allergen Reactions   Aspirin Other (See Comments)    Unknown reaction - listed on [facility] 07/30/21   Ibuprofen Anaphylaxis   Clindamycin/Lincomycin Other (See Comments)    Unknown reaction - listed on [facility] 07/30/21   Other Other (See Comments)    Unknown reaction to opioids - listed on [facility] 07/30/21 hallucinations   Sulfa Antibiotics Other (See Comments)    Unknown reaction - listed on [facility] 07/30/21   Tramadol Other (See Comments)    Hallucinations/ Currently taking for severe pain    Antimicrobials this admission: PTA amoxicillin continued 10/11 vancomycin >> 10/12 10/12 Ancef >>    Microbiology results: 10/11 Wound: ng<24h 10/11 Synovial left hip joint fluid: ng<24h 10/11 MRSA PCR: positive  Thank you for allowing pharmacy to be a part of this patient's care.  Royetta Asal, PharmD, BCPS Clinical Pharmacist [place] Please utilize Amion for appropriate phone number to reach the unit pharmacist ([place]) 04/14/2022 7:27 PM

## 2022-04-14 NOTE — Progress Notes (Signed)
ANTICOAGULATION CONSULT NOTE Pharmacy Consult for Warfarin Indication: atrial fibrillation  Allergies  Allergen Reactions   Aspirin Other (See Comments)    Unknown reaction - listed on [facility] 07/30/21   Ibuprofen Anaphylaxis   Clindamycin/Lincomycin Other (See Comments)    Unknown reaction - listed on [facility] 07/30/21   Other Other (See Comments)    Unknown reaction to opioids - listed on [facility] 07/30/21 hallucinations   Sulfa Antibiotics Other (See Comments)    Unknown reaction - listed on [facility] 07/30/21   Tramadol Other (See Comments)    Hallucinations/ Currently taking for severe pain    Patient Measurements: Height: '5\' 11"'$  (180.3 cm) Weight: 61.8 kg (136 lb 3.2 oz) IBW/kg (Calculated) : 75.3  Vital Signs: Temp: 98.1 F (36.7 C) (10/12 0615) BP: 118/56 (10/12 0615) Pulse Rate: 62 (10/12 0615)  Labs: Recent Labs    04/13/22 0921 04/14/22 0346  HGB 11.8*  --   HCT 36.9*  --   PLT 150  --   APTT 36  --   LABPROT 16.1* 16.1*  INR 1.3* 1.3*  CREATININE 1.10  --      Estimated Creatinine Clearance: 32.8 mL/min (by C-G formula based on SCr of 1.1 mg/dL).  Home warfarin dose reported as '3mg'$  daily except '2mg'$  Tues/Thurs - last dose taken 10/10 @ 0830 am  Assessment: 88 yo male on chronic warfarin for afib.  Pharmacy consulted to dose s/p L hip I&D on 04/13/2022.  04/14/2022 INR 1.3> remains below goal  Goal of Therapy:  INR 2-3 Monitor platelets by anticoagulation protocol: Yes   Plan:  Repeat Warfarin '4mg'$  PO x 1 today Daily PT/INR Consider adding LMWH 40 qday until INR > = 2   Eudelia Bunch, Pharm.D 04/14/2022 9:39 AM

## 2022-04-14 NOTE — Evaluation (Signed)
Physical Therapy Evaluation Patient Details Name: Craig Snow. MRN: [id number] DOB: 16-May-1921 Today's Date: 04/14/2022  History of Present Illness  Pt s/p I and D of wound dehiscence following L hip hemi-arthroplasty by anterior approach 10/23/21.  Pt wtih hx of afib, bladderCA with indwelling catheter and ruptured L eardrum  Clinical Impression  Pt admitted as above and presenting with functional mobility limitations 2* generalized weakness and balance deficits.  Pt very motivated and currently requiring min assist or less for all basic mobility tasks.  Pt should progress to return to previous living arrangement with 24/7 assist of family/PCA.     Recommendations for follow up therapy are one component of a multi-disciplinary discharge planning process, led by the attending physician.  Recommendations may be updated based on patient status, additional functional criteria and insurance authorization.  Follow Up Recommendations Follow physician's recommendations for discharge plan and follow up therapies      Assistance Recommended at Discharge Frequent or constant Supervision/Assistance  Patient can return home with the following  A little help with walking and/or transfers;A little help with bathing/dressing/bathroom;Assistance with cooking/housework;Assist for transportation;Help with stairs or ramp for entrance    Equipment Recommendations None recommended by PT  Recommendations for Other Services       Functional Status Assessment Patient has had a recent decline in their functional status and demonstrates the ability to make significant improvements in function in a reasonable and predictable amount of time.     Precautions / Restrictions Precautions Precautions: Fall Restrictions Weight Bearing Restrictions: No LLE Weight Bearing: Weight bearing as tolerated      Mobility  Bed Mobility Overal bed mobility: Needs Assistance Bed Mobility: Supine to Sit     Supine to  sit: Min guard     General bed mobility comments: for safety    Transfers Overall transfer level: Needs assistance Equipment used: Rolling walker (2 wheels) Transfers: Sit to/from Stand Sit to Stand: Min assist           General transfer comment: Steady assist with cues for use of UEs to self assist    Ambulation/Gait Ambulation/Gait assistance: Min assist Gait Distance (Feet): 100 Feet Assistive device: Rolling walker (2 wheels) Gait Pattern/deviations: Step-to pattern, Step-through pattern, Decreased step length - right, Decreased step length - left, Shuffle, Trunk flexed       General Gait Details: Cues for posture, pace, and position from RW and safety awareness  Stairs            Wheelchair Mobility    Modified Rankin (Stroke Patients Only)       Balance Overall balance assessment: Needs assistance Sitting-balance support: No upper extremity supported, Feet supported Sitting balance-Leahy Scale: Good     Standing balance support: Bilateral upper extremity supported Standing balance-Leahy Scale: Poor                               Pertinent Vitals/Pain Pain Assessment Pain Assessment: No/denies pain    Home Living Family/patient expects to be discharged to:: Private residence Living Arrangements: Children Available Help at Discharge: Family;Available PRN/intermittently;Personal care attendant Type of Home: House         Home Layout: One level Home Equipment: Conservation officer, nature (2 wheels);Rollator (4 wheels) Additional Comments: Pt states he is never home alone    Prior Function Prior Level of Function : Needs assist       Physical Assist : ADLs (physical)   ADLs (physical):  Bathing Mobility Comments: Walking with RW/rollator at home       Hand Dominance        Extremity/Trunk Assessment   Upper Extremity Assessment Upper Extremity Assessment: Generalized weakness;Overall [facility] for tasks assessed    Lower Extremity  Assessment Lower Extremity Assessment: Generalized weakness;Overall [facility] for tasks assessed    Cervical / Trunk Assessment Cervical / Trunk Assessment: Kyphotic  Communication   Communication: HOH;Expressive difficulties;Other (comment)  Cognition Arousal/Alertness: Awake/alert Behavior During Therapy: WFL for tasks assessed/performed Overall Cognitive Status: Within Functional Limits for tasks assessed                                          General Comments      Exercises     Assessment/Plan    PT Assessment Patient needs continued PT services  PT Problem List Decreased strength;Decreased range of motion;Decreased activity tolerance;Decreased mobility;Decreased balance;Decreased knowledge of use of DME       PT Treatment Interventions DME instruction;Gait training;Stair training;Functional mobility training;Therapeutic activities;Therapeutic exercise;Patient/family education;Balance training    PT Goals (Current goals can be found in the Care Plan section)  Acute Rehab PT Goals Patient Stated Goal: Home PT Goal Formulation: With patient Time For Goal Achievement: 04/28/22 Potential to Achieve Goals: Good    Frequency 7X/week     Co-evaluation               AM-PAC PT "6 Clicks" Mobility  Outcome Measure Help needed turning from your back to your side while in a flat bed without using bedrails?: A Little Help needed moving from lying on your back to sitting on the side of a flat bed without using bedrails?: A Little Help needed moving to and from a bed to a chair (including a wheelchair)?: A Little Help needed standing up from a chair using your arms (e.g., wheelchair or bedside chair)?: A Little Help needed to walk in hospital room?: A Little Help needed climbing 3-5 steps with a railing? : A Lot 6 Click Score: 17    End of Session Equipment Utilized During Treatment: Gait belt Activity Tolerance: Patient tolerated treatment well Patient  left: in chair;with call bell/phone within reach;with chair alarm set Nurse Communication: Mobility status PT Visit Diagnosis: Difficulty in walking, not elsewhere classified (R26.2);Muscle weakness (generalized) (M62.81)    Time: 6213-0865 PT Time Calculation (min) (ACUTE ONLY): 23 min   Charges:   PT Evaluation $PT Eval Low Complexity: 1 Low PT Treatments $Gait Training: 8-22 mins        Debe Coder PT Acute Rehabilitation Services Pager [phone] Office [phone]   Shondell Poulson 04/14/2022, 12:34 PM

## 2022-04-14 NOTE — Progress Notes (Signed)
Subjective: Patient reports pain as mild to moderate.  Denies N/V/CP/SOB/Abd pain. He reports mild pain in his hip this morning. He was able to eat some applesauce this morning.   Objective:   VITALS:   Vitals:   04/13/22 1356 04/13/22 1726 04/14/22 0135 04/14/22 0615  BP: 139/66 125/75 (!) 117/55 (!) 118/56  Pulse: (!) 56 78 (!) 54 62  Resp: '12 15 15 20  '$ Temp:   (!) 97.4 F (36.3 C) 98.1 F (36.7 C)  TempSrc:  Axillary    SpO2: 95% 100% 97% 100%  Weight:      Height:        Patient is alert and oriented to self and place this morning. NAD.  ABD soft Neurovascular intact Sensation intact distally Intact pulses distally Dorsiflexion/Plantar flexion intact No cellulitis present Compartment soft Dressings: - Prevena negative dressing. C/D/I. Hooked to house vac suction unit 50mHg, no leaks detected.  - JP drain dressing C/D/I. 20cc SS output.   Lab Results  Component Value Date   WBC 8.7 04/13/2022   HGB 11.8 (L) 04/13/2022   HCT 36.9 (L) 04/13/2022   MCV 107.3 (H) 04/13/2022   PLT 150 04/13/2022   BMET    Component Value Date/Time   NA 136 04/13/2022 0921   K 3.8 04/13/2022 0921   CL 103 04/13/2022 0921   CO2 27 04/13/2022 0921   GLUCOSE 95 04/13/2022 0921   BUN 32 (H) 04/13/2022 0921   CREATININE 1.10 04/13/2022 0921   CALCIUM 9.8 04/13/2022 0921   GFRNONAA >60 04/13/2022 01856  Results for orders placed or performed during the hospital encounter of 04/13/22  Surgical pcr screen     Status: Abnormal   Collection Time: 04/13/22  9:20 AM   Specimen: Nasal Mucosa; Nasal Swab  Result Value Ref Range Status   MRSA, PCR POSITIVE (A) NEGATIVE Final    Comment: RESULT CALLED TO, READ BACK BY AND VERIFIED WITH: R.GOMEZ, RN AT 1417 ON 10.11.23 BY N.THOMPSON    Staphylococcus aureus POSITIVE (A) NEGATIVE Final    Comment: (NOTE) The Xpert SA Assay (FDA approved for NASAL specimens in patients 228years of age and older), is one component of a  comprehensive surveillance program. It is not intended to diagnose infection nor to guide or monitor treatment. Performed at [facility] [address] [place] [address]  Body fluid culture w Gram Stain     Status: None (Preliminary result)   Collection Time: 04/13/22 11:55 AM   Specimen: Synovium; Body Fluid  Result Value Ref Range Status   Specimen Description   Final    SYNOVIAL Performed at [address] [place] [address]   Special Requests   Final    SYNOVIAL LEFT HIP JOINT FLUID Performed at [facility] [address] [place] [address]   Gram Stain   Final    FEW WBC PRESENT, PREDOMINANTLY PMN NO ORGANISMS SEEN Performed at [facility] [address], [place] [address]   Culture PENDING  Incomplete   Report Status PENDING  Incomplete  Aerobic/Anaerobic Culture w Gram Stain (surgical/deep wound)     Status: None (Preliminary result)   Collection Time: 04/13/22 11:58 AM   Specimen: Wound  Result Value Ref Range Status   Specimen Description   Final    WOUND Performed at [address], [place] [address]  Special Requests   Final    SUPERFICIAL LEFT HIP Performed at [place] [address] [place], [place] [address]    Gram Stain   Final    FEW WBC PRESENT,BOTH PMN AND MONONUCLEAR NO ORGANISMS SEEN Performed at [facility] [place] [address] [place], [place] [address]    Culture PENDING  Incomplete   Report Status PENDING  Incomplete     Assessment/Plan: 1 Day Post-Op   Principal Problem:   Surgical wound dehiscence  - Wound cultures and synovial hip cultures pending. Will continue to follow.  - Hemoglobin 11.8. Stable. Continue to monitor.  - JP drain 20cc SS fluid. Continue drain    WBAT with walker DVT ppx: Coumadin, SCDs, TEDS PO pain control: Has only  required tylenol.  PT/OT: PT has not seen yet. PT to come by today.  Dispo: Patient to ambulate with PT today. Continue to monitor cultures.  - Continue his preoperative dental amoxicillin.  We will place him on vancomycin until wound cultures are available.   - Resume Coumadin without bridge.   - Mobilize out of bed with PT.   - Upon discharge, the house VAC suction unit will need to be exchanged for a portable Prevena suction unit.  He will need to follow-up in the office within 7 days of discharge for removal of the JP drain and removal of the negative pressure dressing.   Charlott Rakes, PA-C 04/14/2022, 7:00 AM   [facility]  [place] [address], [place],  [address] Phone: [phone] [email or web address] Facebook  [employer]

## 2022-04-14 NOTE — Plan of Care (Signed)
  Problem: Education: Goal: Knowledge of General Education information will improve Description: Including pain rating scale, medication(s)/side effects and non-pharmacologic comfort measures Outcome: Progressing   Problem: Health Behavior/Discharge Planning: Goal: Ability to manage health-related needs will improve Outcome: Progressing   Problem: Clinical Measurements: Goal: Ability to maintain clinical measurements within normal limits will improve Outcome: Progressing   Problem: Coping: Goal: Level of anxiety will decrease Outcome: Progressing   Problem: Elimination: Goal: Will not experience complications related to bowel motility Outcome: Progressing   Problem: Pain Managment: Goal: General experience of comfort will improve Outcome: Progressing   Problem: Safety: Goal: Ability to remain free from injury will improve Outcome: Progressing   Problem: Skin Integrity: Goal: Risk for impaired skin integrity will decrease Outcome: Progressing   

## 2022-04-15 LAB — PROTIME-INR
INR: 1.8 — ABNORMAL HIGH (ref 0.8–1.2)
Prothrombin Time: 20.5 seconds — ABNORMAL HIGH (ref 11.4–15.2)

## 2022-04-15 MED ORDER — AMOXICILLIN 500 MG PO CAPS
500.0000 mg | ORAL_CAPSULE | Freq: Three times a day (TID) | ORAL | Status: DC
  Administered 2022-04-15 – 2022-04-18 (×9): 500 mg via ORAL
  Filled 2022-04-15 (×11): qty 1

## 2022-04-15 MED ORDER — WARFARIN SODIUM 3 MG PO TABS
3.0000 mg | ORAL_TABLET | ORAL | Status: DC
  Administered 2022-04-15 – 2022-04-18 (×4): 3 mg via ORAL
  Filled 2022-04-15 (×4): qty 1

## 2022-04-15 MED ORDER — SODIUM CHLORIDE 0.9 % IV SOLN: INTRAVENOUS | Status: DC | PRN

## 2022-04-15 MED ORDER — WARFARIN SODIUM 2 MG PO TABS: 2.0000 mg | ORAL_TABLET | ORAL | Status: DC

## 2022-04-15 NOTE — TOC Initial Note (Signed)
Transition of Care (TOC) - Initial/Assessment Note    Patient Details  Name: Craig Snow. MRN: [id number] Date of Birth: 1920/09/18  Transition of Care [facility]) CM/SW Contact:    Lennart Pall, LCSW Phone Number: 04/15/2022, 1:05 PM  Clinical Narrative:                 Met with pt and son today to introduce TOC/ CSW role with dc planning needs.  Both confirm that discharge plan will be for pt to return home with son who provides 24/7 care.  Pt has all needed DME at home.  Son notes he has been receiving [place] services via [place] and would like to continue with them in recommended.  TOC will follow for dc recommendations from therapy and MD.  Expected Discharge Plan: [place] Barriers to Discharge: Continued Medical Work up   Patient Goals and CMS Choice Patient states their goals for this hospitalization and ongoing recovery are:: return home with son      Expected Discharge Plan and Services Expected Discharge Plan: [place] In-house Referral: Clinical Social Work   Post Acute Care Choice: [place] arrangements for the past 2 months: Single Family Home                 DME Arranged: N/A DME Agency: NA                  Prior Living Arrangements/Services Living arrangements for the past 2 months: Single Family Home Lives with:: Adult Children Patient language and need for interpreter reviewed:: Yes Do you feel safe going back to the place where you live?: Yes      Need for Family Participation in Patient Care: Yes (Comment) Care giver support system in place?: Yes (comment)   Criminal Activity/Legal Involvement Pertinent to Current Situation/Hospitalization: No - Comment as needed  Activities of Daily Living Home Assistive Devices/Equipment: Hearing aid, Walker (specify type) ADL Screening (condition at time of admission) Patient's cognitive ability adequate to safely complete daily activities?: Yes Is the patient deaf  or have difficulty hearing?: Yes Does the patient have difficulty seeing, even when wearing glasses/contacts?: No Does the patient have difficulty concentrating, remembering, or making decisions?: No Patient able to express need for assistance with ADLs?: Yes Does the patient have difficulty dressing or bathing?: Yes Independently performs ADLs?: No Communication: Independent with device (comment) Dressing (OT): Needs assistance Is this a change from baseline?: Pre-admission baseline Grooming: Needs assistance Is this a change from baseline?: Pre-admission baseline Feeding: Independent Bathing: Needs assistance Is this a change from baseline?: Pre-admission baseline Toileting: Needs assistance Is this a change from baseline?: Pre-admission baseline In/Out Bed: Needs assistance Is this a change from baseline?: Pre-admission baseline Walks in Home: Needs assistance Is this a change from baseline?: Pre-admission baseline Does the patient have difficulty walking or climbing stairs?: Yes Weakness of Legs: Both Weakness of Arms/Hands: None  Permission Sought/Granted Permission sought to share information with : Family Supports Permission granted to share information with : Yes, Verbal Permission Granted  Share Information with NAME: Craig Snow     Permission granted to share info w Relationship: son  Permission granted to share info w Contact Information: [phone]  Emotional Assessment Appearance:: Appears younger than stated age Attitude/Demeanor/Rapport: Gracious Affect (typically observed): Pleasant Orientation: : Oriented to Self, Oriented to Place, Oriented to  Time, Oriented to Situation Alcohol / Substance Use: Not Applicable Psych Involvement: No (comment)  Admission diagnosis:  Surgical wound dehiscence [T81.31XA] Patient Active Problem List   Diagnosis Date Noted   Surgical wound dehiscence 04/13/2022   Pain due to onychomycosis of toenails of both feet  12/22/2021   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[place]) 12/22/2021   Femoral neck fracture ([place]) 10/22/2021   HLD (hyperlipidemia) 10/22/2021   HTN (hypertension) 10/22/2021   Hypothyroidism 10/22/2021   Stage 1 skin ulcer of sacral region [facility]) 07/30/2021   History of bladder cancer 07/30/2021   Atrial fibrillation ([place]) 07/29/2021   PCP:  Lawerance Cruel, MD Pharmacy:   [place], [place] [id number] Phone: [phone] Fax: [phone]     Social Determinants of Health (SDOH) Interventions    Readmission Risk Interventions    04/15/2022    1:03 PM  Readmission Risk Prevention Plan  Transportation Screening Complete  PCP or Specialist Appt within 5-7 Days Complete  Home Care Screening Complete  Medication Review (RN CM) Complete

## 2022-04-15 NOTE — Plan of Care (Signed)
  Problem: Education: Goal: Knowledge of General Education information will improve Description Including pain rating scale, medication(s)/side effects and non-pharmacologic comfort measures Outcome: Progressing   

## 2022-04-15 NOTE — Progress Notes (Signed)
ANTICOAGULATION CONSULT NOTE Pharmacy Consult for Warfarin Indication: atrial fibrillation  Allergies  Allergen Reactions   Aspirin Other (See Comments)    Unknown reaction - listed on [facility] 07/30/21   Ibuprofen Anaphylaxis   Clindamycin/Lincomycin Other (See Comments)    Unknown reaction - listed on [facility] 07/30/21   Other Other (See Comments)    Unknown reaction to opioids - listed on [facility] 07/30/21 hallucinations   Sulfa Antibiotics Other (See Comments)    Unknown reaction - listed on [facility] 07/30/21   Tramadol Other (See Comments)    Hallucinations/ Currently taking for severe pain    Patient Measurements: Height: '5\' 11"'$  (180.3 cm) Weight: 61.8 kg (136 lb 3.2 oz) IBW/kg (Calculated) : 75.3  Vital Signs: Temp: 97.6 F (36.4 C) (10/13 0529) BP: 113/55 (10/13 0529) Pulse Rate: 57 (10/13 0529)  Labs: Recent Labs    04/13/22 0921 04/14/22 0346 04/15/22 0320  HGB 11.8*  --   --   HCT 36.9*  --   --   PLT 150  --   --   APTT 36  --   --   LABPROT 16.1* 16.1* 20.5*  INR 1.3* 1.3* 1.8*  CREATININE 1.10  --   --      Estimated Creatinine Clearance: 32.8 mL/min (by C-G formula based on SCr of 1.1 mg/dL).  Home warfarin dose reported as '3mg'$  daily except '2mg'$  Tues/Thurs - last dose taken 10/10 @ 0830 am  Assessment: 88 yo male on chronic warfarin for afib.  Pharmacy consulted to dose s/p L hip I&D on 04/13/2022.  04/15/2022 INR 1.3> 1.8,  not yet to 2-3 goal but moving in the right direction after 4 mg x2 doses. No bleeding reported  Goal of Therapy:  INR 2-3 Monitor platelets by anticoagulation protocol: Yes   Plan:  Resume home dose of 3 mg daily except 2 mg Tu/Th Daily PT/INR  Eudelia Bunch, Pharm.D 04/15/2022 7:42 AM

## 2022-04-15 NOTE — Progress Notes (Signed)
Subjective:  Patient reports pain as mild.  Denies N/V/CP/SOB. He reports mild pain this morning.   Objective:   VITALS:   Vitals:   04/14/22 2041 04/15/22 0529 04/15/22 1143 04/15/22 1340  BP: (!) 127/94 (!) 113/55 96/60 (!) 104/54  Pulse: 66 (!) 57 66 (!) 57  Resp: '16 18  16  '$ Temp: 97.8 F (36.6 C) 97.6 F (36.4 C)  (!) 96.2 F (35.7 C)  TempSrc:      SpO2: 99% 98%  (!) 64%  Weight:      Height:        Patient sitting up in bed with legs to chest. NAD. He is oriented to person and place today.  ABD soft Neurovascular intact Sensation intact distally Intact pulses distally Dorsiflexion/Plantar flexion intact No cellulitis present Compartment soft - Prevena negative dressing. C/D/I. Hooked to house vac suction unit 5mHg, no leaks detected.  - JP drain dressing C/D/I. 5cc SS output.   Lab Results  Component Value Date   WBC 8.7 04/13/2022   HGB 11.8 (L) 04/13/2022   HCT 36.9 (L) 04/13/2022   MCV 107.3 (H) 04/13/2022   PLT 150 04/13/2022   BMET    Component Value Date/Time   NA 136 04/13/2022 0921   K 3.8 04/13/2022 0921   CL 103 04/13/2022 0921   CO2 27 04/13/2022 0921   GLUCOSE 95 04/13/2022 0921   BUN 32 (H) 04/13/2022 0921   CREATININE 1.10 04/13/2022 0921   CALCIUM 9.8 04/13/2022 0921   GFRNONAA >60 04/13/2022 04742  Results for orders placed or performed during the hospital encounter of 04/13/22  Surgical pcr screen     Status: Abnormal   Collection Time: 04/13/22  9:20 AM   Specimen: Nasal Mucosa; Nasal Swab  Result Value Ref Range Status   MRSA, PCR POSITIVE (A) NEGATIVE Final    Comment: RESULT CALLED TO, READ BACK BY AND VERIFIED WITH: R.GOMEZ, RN AT 1417 ON 10.11.23 BY N.[email or web address]    Staphylococcus aureus POSITIVE (A) NEGATIVE Final    Comment: (NOTE) The Xpert SA Assay (FDA approved for NASAL specimens in patients 237years of age and older), is one component of a comprehensive surveillance program. It is not intended to diagnose  infection nor to guide or monitor treatment. [facility]formed [facility] [address] [place] [address]  Body fluid culture w Gram Stain     Status: None (Preliminary result)   Collection Time: 04/13/22 11:55 AM   Specimen: Synovium; Body Fluid  Result Value Ref Range Status   Specimen Description   Final    SYNOVIAL Performed at [address] [address] [place] [address]   Special Requests   Final    SYNOVIAL LEFT HIP JOINT FLUID Performed at [facility] [address], [place] [address]   Gram Stain   Final    FEW WBC PRESENT, PREDOMINANTLY PMN NO ORGANISMS SEEN    Culture   Final    NO GROWTH 2 DAYS Performed at [address] [place] [address]   Report Status PENDING  Incomplete  Aerobic/Anaerobic Culture w Gram Stain (surgical/deep wound)     Status: None (Preliminary result)   Collection Time: 04/13/22 11:58 AM   Specimen: Wound  Result Value Ref Range Status   Specimen Description   Final    WOUND Performed at [facility]  [address] [place], [place] [address]    Special Requests   Final    SUPERFICIAL LEFT HIP Performed at [facility], [place] [address], [place], [place] [address]    Gram Stain   Final    FEW WBC PRESENT,BOTH PMN AND MONONUCLEAR NO ORGANISMS SEEN    Culture   Final    NO GROWTH 2 DAYS NO ANAEROBES ISOLATED; CULTURE IN PROGRESS FOR 5 DAYS Performed at [facility], [place] [address], [place], [place] [address]    Report Status PENDING  Incomplete     Assessment/Plan: 2 Days Post-Op   Principal Problem:   Surgical wound dehiscence  - Wound cultures and synovial hip cultures pending. Will continue to follow.  - JP drain 5cc SS fluid.   WBAT with walker DVT ppx: Coumadin, SCDs, TEDS PO pain control PT/OT: He ambulated 100 feet with PT yesterday.  Dispo:  -  Continue to monitor cultures.  - Continue his preoperative dental amoxicillin.  Antibiotics changed to IV ancef until wound cultures are available.   - Resume Coumadin without bridge.   - Mobilize out of bed with PT.   - Upon discharge, the house VAC suction unit will need to be exchanged for a portable Prevena suction unit.  He will need to follow-up in the office within 7 days of discharge for removal of the JP drain and removal of the negative pressure dressing.  Charlott Rakes, PA-C 04/15/2022, 2:09 PM   [facility]  [place] [address], [address], [place],  [address] Phone: [phone] [email or web address] Facebook  [employer]

## 2022-04-15 NOTE — Progress Notes (Signed)
Physical Therapy Treatment Patient Details Name: Craig Snow. MRN: [id number] DOB: [birth year] Today's Date: 04/15/2022   History of Present Illness Pt s/p I and D of wound dehiscence following L hip hemi-arthroplasty by anterior approach 10/23/21.  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  Pt also uses a white board to communicate. Pt already OOB in recliner.  Assisted to bathroom then amb in hallway. General transfer comment: Steady assist with cues for use of UEs to self assist.  also asissted with a toilet transfer as pt c/o "constipation".  Required increased asisst from lower toilet level. General Gait Details: Cues for posture, pace, and position from RW and safety awareness esp with turns. Returned to room and positioned in recliner to comfort.  Son brought pt's seat cushion from home for him to sit on.  Pt plans to return home with Son.   Recommendations for follow up therapy are one component of a multi-disciplinary discharge planning process, led by the attending physician.  Recommendations may be updated based on patient status, additional functional criteria and insurance authorization.  Follow Up Recommendations  Follow physician's recommendations for discharge plan and follow up therapies     Assistance Recommended at Discharge Frequent or constant Supervision/Assistance  Patient can return home with the following A little help with walking and/or transfers;A little help with bathing/dressing/bathroom;Assistance with cooking/housework;Assist for transportation;Help with stairs or ramp for entrance   Equipment Recommendations  None recommended by PT    Recommendations for Other Services       Precautions / Restrictions Precautions Precautions: Fall Precaution Comments:  VAC Restrictions Weight Bearing Restrictions: No LLE Weight Bearing: Weight bearing as tolerated     Mobility  Bed Mobility               General bed mobility comments: OOB in recliner    Transfers Overall transfer level: Needs assistance Equipment used: Rolling walker (2 wheels) Transfers: Sit to/from Stand Sit to Stand: Min assist           General transfer comment: Steady assist with cues for use of UEs to self assist.  also asissted with a toilet transfer as pt c/o "constipation".  Required increased asisst from lower toilet level.    Ambulation/Gait Ambulation/Gait assistance: Min assist Gait Distance (Feet): 94 Feet Assistive device: Rolling walker (2 wheels) Gait Pattern/deviations: Step-to pattern, Step-through pattern, Decreased step length - right, Decreased step length - left, Shuffle, Trunk flexed Gait velocity: decreased     General Gait Details: Cues for posture, pace, and position from RW and safety awareness esp with turns.   Stairs             Wheelchair Mobility    Modified Rankin (Stroke Patients Only)       Balance                                            Cognition Arousal/Alertness: Awake/alert Behavior During Therapy: WFL for tasks assessed/performed Overall Cognitive Status: Within Functional Limits for tasks assessed  General Comments: AxO x 3 pleasant but very HOH and at time difficult to understand due to slurred speech.  Pt also uses a white board to communicate.        Exercises      General Comments        Pertinent Vitals/Pain Pain Assessment Pain Assessment: 0-10 Pain Score: 5  Pain Location: L hip Pain Descriptors / Indicators: Aching, Operative site guarding, Tender Pain Intervention(s): Monitored during session, Premedicated before session, Repositioned    Home Living                          Prior Function             PT Goals (current goals can now be found in the care plan section) Progress towards PT goals: Progressing toward goals    Frequency    7X/week      PT Plan Current plan remains appropriate    Co-evaluation              AM-PAC PT "6 Clicks" Mobility   Outcome Measure  Help needed turning from your back to your side while in a flat bed without using bedrails?: A Little Help needed moving from lying on your back to sitting on the side of a flat bed without using bedrails?: A Little Help needed moving to and from a bed to a chair (including a wheelchair)?: A Little Help needed standing up from a chair using your arms (e.g., wheelchair or bedside chair)?: A Little Help needed to walk in hospital room?: A Little Help needed climbing 3-5 steps with a railing? : A Lot 6 Click Score: 17    End of Session Equipment Utilized During Treatment: Gait belt Activity Tolerance: Patient tolerated treatment well Patient left: in chair;with call bell/phone within reach;with chair alarm set;with family/visitor present Nurse Communication: Mobility status PT Visit Diagnosis: Difficulty in walking, not elsewhere classified (R26.2);Muscle weakness (generalized) (M62.81)     Time: 1333-1400 PT Time Calculation (min) (ACUTE ONLY): 27 min  Charges:  $Gait Training: 8-22 mins $Therapeutic Activity: 8-22 mins                    Rica Koyanagi  PTA [place] [facility] M-F          [phone] Weekend pager [phone]

## 2022-04-16 LAB — BODY FLUID CULTURE W GRAM STAIN: Culture: NO GROWTH

## 2022-04-16 LAB — PROTIME-INR
INR: 2 — ABNORMAL HIGH (ref 0.8–1.2)
Prothrombin Time: 22.1 seconds — ABNORMAL HIGH (ref 11.4–15.2)

## 2022-04-16 NOTE — Progress Notes (Signed)
ANTICOAGULATION CONSULT NOTE - Follow Up Consult  Pharmacy Consult for Coumadin + Ancef Indication:  afib + wound infection  Allergies  Allergen Reactions   Aspirin Other (See Comments)    Unknown reaction - listed on [facility] 07/30/21   Ibuprofen Anaphylaxis   Clindamycin/Lincomycin Other (See Comments)    Unknown reaction - listed on [facility] 07/30/21   Other Other (See Comments)    Unknown reaction to opioids - listed on [facility] 07/30/21 hallucinations   Sulfa Antibiotics Other (See Comments)    Unknown reaction - listed on [facility] 07/30/21   Tramadol Other (See Comments)    Hallucinations/ Currently taking for severe pain    Patient Measurements: Height: '5\' 11"'$  (180.3 cm) Weight: 61.8 kg (136 lb 3.2 oz) IBW/kg (Calculated) : 75.3  Vital Signs: Temp: 97.9 F (36.6 C) (10/14 0516) Temp Source: Oral (10/14 0516) BP: 115/71 (10/14 0516) Pulse Rate: 52 (10/14 0516)  Labs: Recent Labs    04/14/22 0346 04/15/22 0320 04/16/22 0318  LABPROT 16.1* 20.5* 22.1*  INR 1.3* 1.8* 2.0*    Estimated Creatinine Clearance: 32.8 mL/min (by C-G formula based on SCr of 1.1 mg/dL).  Assessment: AC/Heme: Warf per Rx for afib. INR 2 now in goal. No CBC since 10/11.  - PTA warfarin '3mg'$  daily except '2mg'$  on TTh.  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 Dose ok for CrCl>30 F/u wound cx  Home w Home Health Services  Coumadin '3mg'$  po x 1 today. Daily INR   Casimiro Lienhard S. Alford Highland, PharmD, BCPS Clinical Staff Pharmacist [email or web address] Alford Highland, [place] 04/16/2022,10:01  AM

## 2022-04-16 NOTE — Plan of Care (Signed)
  Problem: Nutrition: Goal: Adequate nutrition will be maintained Outcome: Progressing   Problem: Pain Managment: Goal: General experience of comfort will improve Outcome: Progressing   

## 2022-04-16 NOTE — Progress Notes (Signed)
   Subjective: 3 Days Post-Op Procedure(s) (LRB): IRRIGATION AND DEBRIDEMENT SUPERFICIAL HIP (Left)  Pt c/o mild to moderate pain in the left hip Mild pain in the right hip  Denies any new symptoms or issues overnight Wound cultures still pending Patient reports pain as moderate.  Objective:   VITALS:   Vitals:   04/15/22 2148 04/16/22 0516  BP: 103/63 115/71  Pulse: 71 (!) 52  Resp: 16 16  Temp: (!) 97.2 F (36.2 C) 97.9 F (36.6 C)  SpO2: 99% 99%    Left hip: JP drain and wound vac in place Nv intact distally No rashes or edema distally   LABS No results for input(s): "HGB", "HCT", "WBC", "PLT" in the last 72 hours.  No results for input(s): "NA", "K", "BUN", "CREATININE", "GLUCOSE" in the last 72 hours.   Assessment/Plan: 3 Days Post-Op Procedure(s) (LRB): IRRIGATION AND DEBRIDEMENT SUPERFICIAL HIP (Left) Will continue to monitor his progress and culture results PT/OT as able Pain management D/c planning     Brad Luna [place], MPAS [facility] is now [facility]  [place] [address], [place], [place], [place] [address] Phone: [phone] [email or web address] Facebook  [employer]

## 2022-04-16 NOTE — Plan of Care (Signed)
  Problem: Health Behavior/Discharge Planning: Goal: Ability to manage health-related needs will improve Outcome: Progressing   Problem: Activity: Goal: Risk for activity intolerance will decrease Outcome: Progressing   Problem: Pain Managment: Goal: General experience of comfort will improve Outcome: Progressing   

## 2022-04-16 NOTE — Progress Notes (Signed)
Physical Therapy Treatment Patient Details Name: Craig Snow. MRN: [id number] DOB: 08-29-1920 Today's Date: 04/16/2022   History of Present Illness Pt s/p I and D of wound dehiscence following L hip hemi-arthroplasty by anterior approach 10/23/21.  Pt wtih hx of afib, bladderCA with indwelling catheter and ruptured L eardrum    PT Comments    With encouragement incr ambulation distnace today. Multiple rest breaks needed d/t fatigue but able to complete with min assist to min/guard for safety. Son requests mobility be encouraged as much as possible   Recommendations for follow up therapy are one component of a multi-disciplinary discharge planning process, led by the attending physician.  Recommendations may be updated based on patient status, additional functional criteria and insurance authorization.  Follow Up Recommendations  Follow physician's recommendations for discharge plan and follow up therapies     Assistance Recommended at Discharge Frequent or constant Supervision/Assistance  Patient can return home with the following A little help with walking and/or transfers;A little help with bathing/dressing/bathroom;Assistance with cooking/housework;Assist for transportation;Help with stairs or ramp for entrance   Equipment Recommendations  None recommended by PT    Recommendations for Other Services       Precautions / Restrictions Precautions Precautions: Fall Precaution Comments: VAC Restrictions Weight Bearing Restrictions: No LLE Weight Bearing: Weight bearing as tolerated     Mobility  Bed Mobility Overal bed mobility: Needs Assistance Bed Mobility: Supine to Sit     Supine to sit: Min guard, Supervision     General bed mobility comments: for safety    Transfers Overall transfer level: Needs assistance Equipment used: Rolling walker (2 wheels) Transfers: Sit to/from Stand Sit to Stand: Min guard, Min assist           General transfer comment: Steady  assist with cues for use of UEs to self assist.    Ambulation/Gait Ambulation/Gait assistance: Min assist Gait Distance (Feet): 160 Feet Assistive device: Rolling walker (2 wheels) Gait Pattern/deviations: Step-to pattern, Step-through pattern, Decreased step length - right, Decreased step length - left, Trunk flexed Gait velocity: decreased     General Gait Details: Cues for posture, pace, and position from RW and safety awareness esp with turns. multiple standing rests   Marine scientist Rankin (Stroke Patients Only)       Balance                                            Cognition Arousal/Alertness: Awake/alert Behavior During Therapy: WFL for tasks assessed/performed Overall Cognitive Status: Within Functional Limits for tasks assessed                                 General Comments: AxO x 3 pleasant but very HOH and at time difficult to understand due to slurred speech.  Pt also uses a white board to communicate.        Exercises General Exercises - Lower Extremity Ankle Circles/Pumps: AROM, Both, 10 reps    General Comments        Pertinent Vitals/Pain Pain Assessment Pain Assessment: Faces Faces Pain Scale: Hurts a little bit Pain Location: L hip Pain Descriptors / Indicators: Discomfort, Grimacing Pain Intervention(s): Limited activity within patient's tolerance, Monitored during session, Repositioned  Home Living                          Prior Function            PT Goals (current goals can now be found in the care plan section) Acute Rehab PT Goals Patient Stated Goal: Home PT Goal Formulation: With patient Time For Goal Achievement: 04/28/22 Potential to Achieve Goals: Good Progress towards PT goals: Progressing toward goals    Frequency    7X/week      PT Plan Current plan remains appropriate    Co-evaluation              AM-PAC PT "6  Clicks" Mobility   Outcome Measure  Help needed turning from your back to your side while in a flat bed without using bedrails?: A Little Help needed moving from lying on your back to sitting on the side of a flat bed without using bedrails?: A Little Help needed moving to and from a bed to a chair (including a wheelchair)?: A Little Help needed standing up from a chair using your arms (e.g., wheelchair or bedside chair)?: A Little Help needed to walk in hospital room?: A Little Help needed climbing 3-5 steps with a railing? : A Little 6 Click Score: 18    End of Session Equipment Utilized During Treatment: Gait belt Activity Tolerance: Patient tolerated treatment well Patient left: in chair;with call bell/phone within reach;with chair alarm set;with family/visitor present Nurse Communication: Mobility status PT Visit Diagnosis: Difficulty in walking, not elsewhere classified (R26.2);Muscle weakness (generalized) (M62.81)     Time: 1340-1407 PT Time Calculation (min) (ACUTE ONLY): 27 min  Charges:  $Gait Training: 23-37 mins                     Baxter Flattery, PT  Acute Rehab Dept [facility]) [phone]  [facility] Weekend Pager [facility] only)  [phone]  04/16/2022    [facility] 04/16/2022, 3:01 PM

## 2022-04-17 LAB — PROTIME-INR
INR: 2.1 — ABNORMAL HIGH (ref 0.8–1.2)
Prothrombin Time: 23.5 seconds — ABNORMAL HIGH (ref 11.4–15.2)

## 2022-04-17 NOTE — Progress Notes (Signed)
ANTICOAGULATION CONSULT NOTE - Follow Up Consult  Pharmacy Consult for Coumadin Indication: afib  Allergies  Allergen Reactions   Aspirin Other (See Comments)    Unknown reaction - listed on [facility] 07/30/21   Ibuprofen Anaphylaxis   Clindamycin/Lincomycin Other (See Comments)    Unknown reaction - listed on [facility] 07/30/21   Other Other (See Comments)    Unknown reaction to opioids - listed on [facility] 07/30/21 hallucinations   Sulfa Antibiotics Other (See Comments)    Unknown reaction - listed on [facility] 07/30/21   Tramadol Other (See Comments)    Hallucinations/ Currently taking for severe pain    Patient Measurements: Height: '5\' 11"'$  (180.3 cm) Weight: 61.8 kg (136 lb 3.2 oz) IBW/kg (Calculated) : 75.3  Vital Signs: Temp: 97.6 F (36.4 C) (10/15 0450) Temp Source: Oral (10/15 0450) BP: 132/61 (10/15 0450) Pulse Rate: 54 (10/15 0450)  Labs: Recent Labs    04/15/22 0320 04/16/22 0318 04/17/22 0318  LABPROT 20.5* 22.1* 23.5*  INR 1.8* 2.0* 2.1*     Estimated Creatinine Clearance: 32.8 mL/min (by C-G formula based on SCr of 1.1 mg/dL).  Assessment: AC/Heme: Warf per Rx for afib. INR 2.1 in goal. No CBC since 10/11.  - PTA warfarin '3mg'$  daily except '2mg'$  on TTh.  Goal of Therapy:  INR 2-3 Monitor platelets by anticoagulation protocol: Yes   Plan:  Con't home regimen '3mg'$  MWFSS, '2mg'$  TTh Daily INR   Lyndall Windt S. Alford Highland, PharmD, BCPS Clinical Staff Pharmacist [email or web address] Alford Highland, [employer] 04/17/2022,9:53 AM

## 2022-04-17 NOTE — Progress Notes (Signed)
   Subjective: 4 Days Post-Op Procedure(s) (LRB): IRRIGATION AND DEBRIDEMENT SUPERFICIAL HIP (Left)  Pt resting c/o mild pain to left hip  Denies any new symptoms overnight Cultures still no growth Patient reports pain as mild.  Objective:   VITALS:   Vitals:   04/16/22 2121 04/17/22 0450  BP: 127/68 132/61  Pulse: 74 (!) 54  Resp: 18 18  Temp: (!) 97.3 F (36.3 C) 97.6 F (36.4 C)  SpO2: 100% 100%    Left hip: incision healing well Wound vac in place  Nv intact distally Good rom with minimal guarding  LABS No results for input(s): "HGB", "HCT", "WBC", "PLT" in the last 72 hours.  No results for input(s): "NA", "K", "BUN", "CREATININE", "GLUCOSE" in the last 72 hours.   Assessment/Plan: 4 Days Post-Op Procedure(s) (LRB): IRRIGATION AND DEBRIDEMENT SUPERFICIAL HIP (Left) Continue IV antibiotics D/c planning Pain management PT/OT     Merla Riches PA-C, MPAS [facility] is now [facility]  [place] [address], [place], [place], [place] [address] Phone: [phone] [email or web address]  [employer]

## 2022-04-17 NOTE — Plan of Care (Signed)
  Problem: Education: Goal: Knowledge of General Education information will improve Description Including pain rating scale, medication(s)/side effects and non-pharmacologic comfort measures Outcome: Progressing   Problem: Health Behavior/Discharge Planning: Goal: Ability to manage health-related needs will improve Outcome: Progressing   

## 2022-04-17 NOTE — Progress Notes (Signed)
Physical Therapy Treatment Patient Details Name: Craig Snow. MRN: [id number] DOB: 1920-10-08 Today's Date: 04/17/2022   History of Present Illness Pt s/p I and D of wound dehiscence following L hip hemi-arthroplasty by anterior approach 10/23/21.  Pt wtih hx of afib, bladderCA with indwelling catheter and ruptured L eardrum    PT Comments    Pt agreeable to PT.  improved distance/activity tolerance with amb today. Continue to follow in acute setting. Plan is for home with son.  Recommendations for follow up therapy are one component of a multi-disciplinary discharge planning process, led by the attending physician.  Recommendations may be updated based on patient status, additional functional criteria and insurance authorization.  Follow Up Recommendations  Follow physician's recommendations for discharge plan and follow up therapies     Assistance Recommended at Discharge Frequent or constant Supervision/Assistance  Patient can return home with the following A little help with walking and/or transfers;A little help with bathing/dressing/bathroom;Assistance with cooking/housework;Assist for transportation;Help with stairs or ramp for entrance   Equipment Recommendations  None recommended by PT    Recommendations for Other Services       Precautions / Restrictions Precautions Precautions: Fall Precaution Comments: VAC Restrictions Weight Bearing Restrictions: No LLE Weight Bearing: Weight bearing as tolerated     Mobility  Bed Mobility Overal bed mobility: Needs Assistance Bed Mobility: Supine to Sit     Supine to sit: Min guard, Supervision     General bed mobility comments: for safety    Transfers Overall transfer level: Needs assistance Equipment used: Rolling walker (2 wheels) Transfers: Sit to/from Stand Sit to Stand: Min guard, Min assist           General transfer comment: Steady assist with cues for use of UEs to self assist.     Ambulation/Gait Ambulation/Gait assistance: Min assist Gait Distance (Feet): 240 Feet (10' more) Assistive device: Rolling walker (2 wheels) Gait Pattern/deviations: Step-to pattern, Step-through pattern, Decreased step length - right, Decreased step length - left, Trunk flexed Gait velocity: decreased     General Gait Details: Cues for posture, pace, and position from RW and safety awareness esp with turns. LOb x2 with min assist to recover, otherwise min/guard   Chief Strategy Officer    Modified Rankin (Stroke Patients Only)       Balance   Sitting-balance support: No upper extremity supported, Feet supported Sitting balance-Leahy Scale: Good     Standing balance support: Bilateral upper extremity supported, Single extremity supported, During functional activity Standing balance-Leahy Scale: Fair (reliant on UEs for amb)                              Cognition Arousal/Alertness: Awake/alert Behavior During Therapy: WFL for tasks assessed/performed Overall Cognitive Status: Within Functional Limits for tasks assessed                                 General Comments: AxO x 3 pleasant but very HOH and at time difficult to understand due to slurred speech.  Pt also uses a white board to communicate.        Exercises General Exercises - Lower Extremity Ankle Circles/Pumps: AROM, Both, 10 reps    General Comments        Pertinent Vitals/Pain Pain Assessment Pain Assessment: Faces Faces Pain Scale: Hurts  a little bit Pain Location: L hip Pain Descriptors / Indicators: Discomfort, Grimacing Pain Intervention(s): Limited activity within patient's tolerance, Monitored during session, Premedicated before session, Repositioned    Home Living                          Prior Function            PT Goals (current goals can now be found in the care plan section) Acute Rehab PT Goals Patient Stated Goal:  Home PT Goal Formulation: With patient Time For Goal Achievement: 04/28/22 Potential to Achieve Goals: Good Progress towards PT goals: Progressing toward goals    Frequency    7X/week      PT Plan Current plan remains appropriate    Co-evaluation              AM-PAC PT "6 Clicks" Mobility   Outcome Measure  Help needed turning from your back to your side while in a flat bed without using bedrails?: A Little Help needed moving from lying on your back to sitting on the side of a flat bed without using bedrails?: A Little Help needed moving to and from a bed to a chair (including a wheelchair)?: A Little Help needed standing up from a chair using your arms (e.g., wheelchair or bedside chair)?: A Little Help needed to walk in hospital room?: A Little Help needed climbing 3-5 steps with a railing? : A Little 6 Click Score: 18    End of Session Equipment Utilized During Treatment: Gait belt Activity Tolerance: Patient tolerated treatment well Patient left: in chair;with call bell/phone within reach;with chair alarm set;with family/visitor present Nurse Communication: Mobility status PT Visit Diagnosis: Difficulty in walking, not elsewhere classified (R26.2);Muscle weakness (generalized) (M62.81)     Time: 6967-8938 PT Time Calculation (min) (ACUTE ONLY): 25 min  Charges:  $Gait Training: 23-37 mins                     Baxter Flattery, PT  Acute Rehab Dept [facility]) [phone]  [facility] Weekend Pager [employer] only)  [phone]  04/17/2022    [facility] 04/17/2022, 12:48 PM

## 2022-04-17 NOTE — Plan of Care (Signed)
  Problem: Nutrition: Goal: Adequate nutrition will be maintained Outcome: Progressing   Problem: Pain Managment: Goal: General experience of comfort will improve Outcome: Progressing   

## 2022-04-18 LAB — AEROBIC/ANAEROBIC CULTURE W GRAM STAIN (SURGICAL/DEEP WOUND): Culture: NO GROWTH

## 2022-04-18 LAB — PROTIME-INR
INR: 2.6 — ABNORMAL HIGH (ref 0.8–1.2)
Prothrombin Time: 27.7 seconds — ABNORMAL HIGH (ref 11.4–15.2)

## 2022-04-18 MED ORDER — CEFADROXIL 500 MG PO CAPS: 500.0000 mg | ORAL_CAPSULE | Freq: Two times a day (BID) | ORAL | 0 refills | Status: AC

## 2022-04-18 NOTE — Plan of Care (Signed)
  Problem: Education: Goal: Knowledge of General Education information will improve Description: Including pain rating scale, medication(s)/side effects and non-pharmacologic comfort measures Outcome: Completed/Met   Problem: Health Behavior/Discharge Planning: Goal: Ability to manage health-related needs will improve Outcome: Completed/Met   Problem: Clinical Measurements: Goal: Ability to maintain clinical measurements within normal limits will improve Outcome: Completed/Met Goal: Will remain free from infection Outcome: Completed/Met Goal: Diagnostic test results will improve Outcome: Completed/Met Goal: Respiratory complications will improve Outcome: Completed/Met Goal: Cardiovascular complication will be avoided Outcome: Completed/Met   Problem: Activity: Goal: Risk for activity intolerance will decrease 04/18/2022 1820 by Iver Nestle A, LPN Outcome: Completed/Met 04/18/2022 1003 by Iver Nestle A, LPN Outcome: Progressing   Problem: Nutrition: Goal: Adequate nutrition will be maintained 04/18/2022 1820 by Iver Nestle A, LPN Outcome: Completed/Met 04/18/2022 1003 by Iver Nestle A, LPN Outcome: Progressing   Problem: Coping: Goal: Level of anxiety will decrease Outcome: Completed/Met   Problem: Elimination: Goal: Will not experience complications related to bowel motility 04/18/2022 1820 by Iver Nestle A, LPN Outcome: Completed/Met 04/18/2022 1003 by Iver Nestle A, LPN Outcome: Progressing Goal: Will not experience complications related to urinary retention 04/18/2022 1820 by Iver Nestle A, LPN Outcome: Completed/Met 04/18/2022 1003 by Iver Nestle A, LPN Outcome: Progressing   Problem: Pain Managment: Goal: General experience of comfort will improve Outcome: Completed/Met   Problem: Safety: Goal: Ability to remain free from injury will improve Outcome: Completed/Met   Problem: Skin Integrity: Goal: Risk for impaired skin integrity  will decrease Outcome: Completed/Met

## 2022-04-18 NOTE — Progress Notes (Signed)
Physical Therapy Treatment Patient Details Name: Craig Snow. MRN: [id number] DOB: 01-Mar-1921 Today's Date: 04/18/2022   History of Present Illness Pt s/p I and D of wound dehiscence following L hip hemi-arthroplasty by anterior approach 10/23/21.  Pt wtih hx of afib, bladderCA with indwelling catheter and ruptured L eardrum    PT Comments    Pt progressing although  fatiguing more rapidly today compared to previous session, LOB x 4 during ambulation compared to 2x in greater distance yesterday.  Recommend HHPT if available  Recommendations for follow up therapy are one component of a multi-disciplinary discharge planning process, led by the attending physician.  Recommendations may be updated based on patient status, additional functional criteria and insurance authorization.  Follow Up Recommendations  Follow physician's recommendations for discharge plan and follow up therapies     Assistance Recommended at Discharge Frequent or constant Supervision/Assistance  Patient can return home with the following A little help with walking and/or transfers;A little help with bathing/dressing/bathroom;Assistance with cooking/housework;Assist for transportation;Help with stairs or ramp for entrance   Equipment Recommendations  None recommended by PT    Recommendations for Other Services       Precautions / Restrictions Precautions Precautions: Fall Precaution Comments: VAC Restrictions Weight Bearing Restrictions: No LLE Weight Bearing: Weight bearing as tolerated     Mobility  Bed Mobility Overal bed mobility: Needs Assistance Bed Mobility: Supine to Sit     Supine to sit: Min guard, Supervision, HOB elevated     General bed mobility comments: for safety    Transfers Overall transfer level: Needs assistance Equipment used: Rolling walker (2 wheels) Transfers: Sit to/from Stand Sit to Stand: Min guard, Min assist           General transfer comment: min/guard for  safety. pt self corrects for hand placement, however requires safety cues on descent d/t fatigue (pt sitting too soon, not backing up to recliner)    Ambulation/Gait Ambulation/Gait assistance: Min assist Gait Distance (Feet): 170 Feet Assistive device: Rolling walker (2 wheels) Gait Pattern/deviations: Step-through pattern, Decreased step length - left, Staggering right, Trunk flexed, Narrow base of support Gait velocity: decreased     General Gait Details: Cues for posture, pace, and position from RW and safety awareness esp with turns. LOB x4 with min assist to recover. 4 standingrest, one leaning on wall d/t fatigue   Stairs             Wheelchair Mobility    Modified Rankin (Stroke Patients Only)       Balance   Sitting-balance support: No upper extremity supported, Feet supported Sitting balance-Leahy Scale: Good     Standing balance support: Bilateral upper extremity supported, Single extremity supported, During functional activity Standing balance-Leahy Scale: Fair (reliant on UEs for amb)                              Cognition Arousal/Alertness: Awake/alert Behavior During Therapy: WFL for tasks assessed/performed Overall Cognitive Status: Within Functional Limits for tasks assessed                                 General Comments: AxO x 3 pleasant but very HOH and at time difficult to understand due to slurred speech.  Pt also uses a white board to communicate as needed        Exercises      General  Comments        Pertinent Vitals/Pain Pain Assessment Pain Assessment: 0-10 Faces Pain Scale: Hurts a little bit Pain Location: L hip Pain Descriptors / Indicators: Discomfort, Grimacing Pain Intervention(s): Monitored during session, Repositioned    Home Living                          Prior Function            PT Goals (current goals can now be found in the care plan section) Acute Rehab PT  Goals Patient Stated Goal: Home PT Goal Formulation: With patient Time For Goal Achievement: 04/28/22 Potential to Achieve Goals: Good Progress towards PT goals: Progressing toward goals    Frequency    7X/week      PT Plan Current plan remains appropriate    Co-evaluation              AM-PAC PT "6 Clicks" Mobility   Outcome Measure  Help needed turning from your back to your side while in a flat bed without using bedrails?: A Little Help needed moving from lying on your back to sitting on the side of a flat bed without using bedrails?: A Little Help needed moving to and from a bed to a chair (including a wheelchair)?: A Little Help needed standing up from a chair using your arms (e.g., wheelchair or bedside chair)?: A Little Help needed to walk in hospital room?: A Little Help needed climbing 3-5 steps with a railing? : A Little 6 Click Score: 18    End of Session Equipment Utilized During Treatment: Gait belt Activity Tolerance: Patient tolerated treatment well Patient left: in chair;with call bell/phone within reach;with chair alarm set Nurse Communication: Mobility status PT Visit Diagnosis: Difficulty in walking, not elsewhere classified (R26.2);Muscle weakness (generalized) (M62.81)     Time: 8889-1694 PT Time Calculation (min) (ACUTE ONLY): 20 min  Charges:  $Gait Training: 8-22 mins                     Baxter Flattery, PT  Acute Rehab Dept [facility]) [phone]  [facility] Weekend Pager [facility] only)  [phone]  04/18/2022    [facility] 04/18/2022, 10:58 AM

## 2022-04-18 NOTE — Progress Notes (Signed)
ANTICOAGULATION CONSULT NOTE - Follow Up  Pharmacy Consult for warfarin Indication: atrial fibrillation  Allergies  Allergen Reactions   Aspirin Other (See Comments)    Unknown reaction - listed on [facility] 07/30/21   Ibuprofen Anaphylaxis   Clindamycin/Lincomycin Other (See Comments)    Unknown reaction - listed on [facility] 07/30/21   Other Other (See Comments)    Unknown reaction to opioids - listed on [facility] 07/30/21 hallucinations   Sulfa Antibiotics Other (See Comments)    Unknown reaction - listed on [facility] 07/30/21   Tramadol Other (See Comments)    Hallucinations/ Currently taking for severe pain    Patient Measurements: Height: '5\' 11"'$  (180.3 cm) Weight: 61.8 kg (136 lb 3.2 oz) IBW/kg (Calculated) : 75.3  Vital Signs: Temp: 97.5 F (36.4 C) (10/16 0557) BP: 96/45 (10/16 0942) Pulse Rate: 52 (10/16 0942)  Labs: Recent Labs    04/16/22 0318 04/17/22 0318 04/18/22 0317  LABPROT 22.1* 23.5* 27.7*  INR 2.0* 2.1* 2.6*    Estimated Creatinine Clearance: 32.8 mL/min (by C-G formula based on SCr of 1.1 mg/dL).   Medical History: Past Medical History:  Diagnosis Date   Acquired thrombophilia ([place])    Acute lower UTI 07/30/2021   Arrhythmia    Atrial fibrillation (HCC)    Bladder cancer (HCC)    BPH (benign prostatic hyperplasia)    Candidiasis of penis 07/30/2021   Chronic ear infection, left    Community acquired pneumonia 07/30/2021   aspiration   GERD (gastroesophageal reflux disease)    Headache    History of radiation therapy    oral cancer   Hyperlipidemia    Hypertension    Indwelling Foley catheter present    Neoplasm of palate 2008   Poor dentition    Ruptured eardrum, left    absent eardrum   Supratherapeutic INR 07/30/2021   Thyrotoxicosis     Medications: Warfarin prior to admission for afib -Home dose: Warfarin 3 mg PO daily except 2 mg on Tuesdays and Thursdays  Assessment: Pt is a 69 yoM who underwent I&D of superficial hip on 04/13/22. Pt  chronically anticoagulated with warfarin for afib - pharmacy consulted to dose while inpatient.   Today, 04/18/22 INR = 2.6 remains therapeutic  CBC: Last checked on 10/11 Diet: Dysphagia I. Meal intake 100% Drug Interactions: No major DDI, but pt remains on amoxicillin and cefazolin which may enhance anticoagulation effects of warfarin.   I called oral surgery office of Dr. [name] today to get clarification on length of course of amoxicillin. Pt had 8 teeth pulled on 04/07/22 and was prescribed 20 tablets of amoxicillin 500 mg with instructions to take 4 tablets 1 hour prior to the procedure and then 1 tablet either BID or TID until course completed. No refills.   Goal of Therapy:  INR 2-3 Monitor platelets by anticoagulation protocol: Yes   Plan:  Continue home regimen of warfarin 3 mg today Recommend to discontinue amoxicillin as initial prescribed course should be completed. Discussed with ortho PA. CBC with AM labs tomorrow  Lenis Noon, PharmD 04/18/2022,12:26 PM

## 2022-04-18 NOTE — Progress Notes (Signed)
Patients intraoperative subcutaneous culture and synovial fluid cultures are finalized and negative.   Left hip wound cultures from in office with synovasure resulted: Gram stain showed gram positive cocci in pairs, but no growth on cultures. Will start patient on cefadroxil '500mg'$  BID for 14 days. Patient to follow-up next Monday 04/25/22 for removal of prevena negative pressure dressing and wound vac.   Patient's son updated and all questions answered.

## 2022-04-18 NOTE — Progress Notes (Signed)
Subjective:  Patient reports pain as mild.  Denies N/V/CP/SOB/Abd pain. Denies tingling and numbness in LE bilaterally.   Objective:   VITALS:   Vitals:   04/17/22 1017 04/17/22 1426 04/17/22 2101 04/18/22 0557  BP: (!) 124/56 (!) 114/53 (!) 111/54 (!) 109/58  Pulse: 61 (!) 48 62 (!) 51  Resp:  '18 18 16  '$ Temp:  (!) 97.5 F (36.4 C) 98 F (36.7 C) (!) 97.5 F (36.4 C)  TempSrc:  Oral Oral   SpO2:  96% 95% 100%  Weight:      Height:        Patient lying comfortably in bed. NAD.  ABD soft Neurovascular intact Sensation intact distally Intact pulses distally Dorsiflexion/Plantar flexion intact No cellulitis present Compartment soft - Prevena negative dressing. C/D/I. Hooked to house vac suction unit 35mHg, no leaks detected.  - JP drain dressing C/D/I. No output over last shift. Suture clipped and JP drain removed today. Dry dressing placed.   Lab Results  Component Value Date   WBC 8.7 04/13/2022   HGB 11.8 (L) 04/13/2022   HCT 36.9 (L) 04/13/2022   MCV 107.3 (H) 04/13/2022   PLT 150 04/13/2022   BMET    Component Value Date/Time   NA 136 04/13/2022 0921   K 3.8 04/13/2022 0921   CL 103 04/13/2022 0921   CO2 27 04/13/2022 0921   GLUCOSE 95 04/13/2022 0921   BUN 32 (H) 04/13/2022 0921   CREATININE 1.10 04/13/2022 0921   CALCIUM 9.8 04/13/2022 0921   GFRNONAA >60 04/13/2022 04782  Results for orders placed or performed during the hospital encounter of 04/13/22  Surgical pcr screen     Status: Abnormal   Collection Time: 04/13/22  9:20 AM   Specimen: Nasal Mucosa; Nasal Swab  Result Value Ref Range Status   MRSA, PCR POSITIVE (A) NEGATIVE Final    Comment: RESULT CALLED TO, READ BACK BY AND VERIFIED WITH: R.[email or web address], RN AT 1417 ON 10.11.23 BY [email or web address]    Staphylococcus aureus POSITIVE (A) NEGATIVE Final    Comment: (NOTE) The Xpert SA Assay (FDA approved for NASAL specimens in patients 249years of age and older), is one component of a  comprehensive surveillance program. It is not intended to diagnose infection nor to guide or monitor treatment. Performed at [facility] [address] [place] [id number]  Body fluid culture w Gram Stain     Status: None   Collection Time: 04/13/22 11:55 AM   Specimen: Synovium; Body Fluid  Result Value Ref Range Status   Specimen Description   Final    SYNOVIAL Performed at [facility] [place] [address]   Special Requests   Final    SYNOVIAL LEFT HIP JOINT FLUID Performed at [facility] [address] [place] [address]   Gram Stain   Final    FEW WBC PRESENT, PREDOMINANTLY PMN NO ORGANISMS SEEN    Culture   Final    NO GROWTH 3 DAYS Performed at [address] [place] [id number]   Report Status 04/16/2022 FINAL  Final  Aerobic/Anaerobic Culture w Gram Stain (surgical/deep wound)     Status: None (Preliminary result)   Collection Time: 04/13/22 11:58 AM   Specimen: Wound  Result Value Ref Range Status   Specimen Description   Final    WOUND Performed at [facility] [address] [place], [place] [address]    Special Requests   Final    SUPERFICIAL LEFT HIP Performed at [place] [address], [place], [place] [address]    Gram Stain   Final    FEW WBC PRESENT,BOTH PMN AND MONONUCLEAR NO ORGANISMS SEEN    Culture   Final    NO GROWTH 4 DAYS NO ANAEROBES ISOLATED; CULTURE IN PROGRESS FOR 5 DAYS Performed at [facility], [place] [address], [place], [place] [address]    Report Status PENDING  Incomplete     Assessment/Plan: 5 Days Post-Op   Principal Problem:   Surgical wound dehiscence  - Wound cultures and synovial hip cultures pending. NGTD. Will continue to follow.   WBAT with walker DVT ppx: Coumadin, SCDs, TEDS PO pain control PT/OT:  Dispo: - Continue to monitor  cultures.  - Continue his preoperative dental amoxicillin.  Continue IV ancef until wound cultures are available.   - Continue coumadin. INR 2.6 today.   - Mobilize out of bed with PT.   - Upon discharge, the house VAC suction unit will need to be exchanged for a portable Prevena suction unit.  He will need to follow-up in the office within 7 days of discharge for removal of the JP drain and removal of the negative pressure dressing.   Charlott Rakes, PA-C 04/18/2022, 7:57 AM   [facility]  [place] [address], [place],  [address] Phone: [phone] [email or web address] Facebook  [employer]

## 2022-04-18 NOTE — Plan of Care (Signed)
  Problem: Activity: Goal: Risk for activity intolerance will decrease Outcome: Progressing   Problem: Nutrition: Goal: Adequate nutrition will be maintained Outcome: Progressing   Problem: Elimination: Goal: Will not experience complications related to bowel motility Outcome: Progressing Goal: Will not experience complications related to urinary retention Outcome: Progressing   

## 2022-04-18 NOTE — Progress Notes (Signed)
Pt discharged to home with son. Discharge education completed with patient and son. Paperwork given, all questions and concerns addressed.

## 2022-04-18 NOTE — TOC Transition Note (Signed)
Transition of Care [facility]) - CM/SW Discharge Note   Patient Details  Name: Craig Snow. MRN: [id number] Date of Birth: 04/02/1921  [facility] CM/SW Contact:  Lennart Pall, LCSW Phone Number: 04/18/2022, 3:59 PM   Clinical Narrative:    Pt medically cleared for dc today.  Pt has all needed DME at home.  Per PA, pt to do HEP so no HH follow up needed.  No further TOC needs.   Final next level of care: Home/Self Care Barriers to Discharge: Barriers Resolved   Patient Goals and CMS Choice Patient states their goals for this hospitalization and ongoing recovery are:: return home with son      Discharge Placement                       Discharge Plan and Services In-house Referral: Clinical Social Work   Post Acute Care Choice: Home Health          DME Arranged: N/A DME Agency: NA         [place] Agency: [facility]        Social Determinants of Health (SDOH) Interventions     Readmission Risk Interventions    04/15/2022    1:03 PM  Readmission Risk Prevention Plan  Transportation Screening Complete  PCP or Specialist Appt within 5-7 Days Complete  Home Care Screening Complete  Medication Review (RN CM) Complete

## 2022-04-22 NOTE — Discharge Summary (Cosign Needed Addendum)
Physician Discharge Summary  Patient ID: Craig Snow. MRN: [id number] DOB/AGE: [birth year] 88 y.o.  Admit date: 04/13/2022 Discharge date: 04/18/2022  Admission Diagnoses:  Surgical wound dehiscence  Discharge Diagnoses:  Principal Problem:   Surgical wound dehiscence   Past Medical History:  Diagnosis Date   Acquired thrombophilia ([place])    Acute lower UTI 07/30/2021   Arrhythmia    Atrial fibrillation (HCC)    Bladder cancer (HCC)    BPH (benign prostatic hyperplasia)    Candidiasis of penis 07/30/2021   Chronic ear infection, left    Community acquired pneumonia 07/30/2021   aspiration   GERD (gastroesophageal reflux disease)    Headache    History of radiation therapy    oral cancer   Hyperlipidemia    Hypertension    Indwelling Foley catheter present    Neoplasm of palate 2008   Poor dentition    Ruptured eardrum, left    absent eardrum   Supratherapeutic INR 07/30/2021   Thyrotoxicosis     Surgeries: Procedure(s): IRRIGATION AND DEBRIDEMENT SUPERFICIAL HIP on 04/13/2022   Consultants (if any):   Discharged Condition: Improved  Hospital Course: Craig Snow. is an 88 y.o. male who was admitted 04/13/2022 with a diagnosis of Surgical wound dehiscence and went to the operating room on 04/13/2022 and underwent the above named procedures.    He was given perioperative antibiotics:  Anti-infectives (From admission, onward)    Start     Dose/Rate Route Frequency Ordered Stop   04/18/22 0000  cefadroxil (DURICEF) 500 MG capsule        500 mg Oral 2 times daily 04/18/22 1600 05/02/22 2359   04/15/22 1415  amoxicillin (AMOXIL) capsule 500 mg  Status:  Discontinued        500 mg Oral Every 8 hours 04/15/22 1325 04/18/22 1349   04/14/22 2200  ceFAZolin (ANCEF) IVPB 2g/100 mL premix  Status:  Discontinued        2 g 200 mL/hr over 30 Minutes Intravenous Every 8 hours 04/14/22 1929 04/18/22 2347   04/14/22 1000  vancomycin (VANCOREADY) IVPB 750 mg/150 mL   Status:  Discontinued        750 mg 150 mL/hr over 60 Minutes Intravenous Every 24 hours 04/13/22 1442 04/14/22 1841   04/13/22 2200  amoxicillin (AMOXIL) capsule 500 mg  Status:  Discontinued       Note to Pharmacy: For recent oral surgery   500 mg Oral Every 8 hours 04/13/22 1355 04/15/22 0719   04/13/22 0830  ceFAZolin (ANCEF) IVPB 2g/100 mL premix        2 g 200 mL/hr over 30 Minutes Intravenous On call to O.R. 04/13/22 0822 04/13/22 1114   04/13/22 0830  vancomycin (VANCOCIN) IVPB 1000 mg/200 mL premix        1,000 mg 200 mL/hr over 60 Minutes Intravenous On call to O.R. 04/13/22 5053 04/13/22 1536       He was given sequential compression devices, early ambulation, and warfarin for DVT prophylaxis.  POD#1 patient had been started on IV vancomycin while office synovasure and intraoperative cultures were pending. He was also continued on oral amoxicillin for recent dental procedure. He ambulated 100 feet with PT. Prevena negative pressure on house suction unit 70mHg, no leaks. JP drain in place with SS output.  POD#2 He was not tolerating the IV vancomycin as it caused confusion. He was switched to IV ancef. He ambulated well with PT. Warfarin monitoring throughout stay for therapeutic dosing. Prevena negative  dressing and JP drain continued.  POD#3-4 He continued to work with PT and IV antibiotics continued. Intraoperative cultures and synovasure still pending.  POD#5. Patients intraoperative subcutaneous culture and synovial fluid cultures are finalized and negative. Left hip wound cultures from in office with synovasure resulted: Gram stain showed gram positive cocci in pairs, but no growth on cultures. Will start patient on cefadroxil '500mg'$  BID for 14 days. JP drain was removed and dry dressing placed. Prevena negative pressure dressing intact the house VAC suction unit was exchanged for a portable Prevena suction unit at discharge.   He benefited maximally from the hospital stay and  there were no complications.    Recent vital signs:  Vitals:   04/18/22 1310 04/18/22 1332  BP: (!) 104/56 (!) 105/45  Pulse:  62  Resp:  18  Temp:  97.9 F (36.6 C)  SpO2:  100%    Recent laboratory studies:  Lab Results  Component Value Date   HGB 11.8 (L) 04/13/2022   HGB 12.4 (L) 01/28/2022   HGB 8.3 (L) 10/27/2021   Lab Results  Component Value Date   WBC 8.7 04/13/2022   PLT 150 04/13/2022   Lab Results  Component Value Date   INR 2.6 (H) 04/18/2022   Lab Results  Component Value Date   NA 136 04/13/2022   K 3.8 04/13/2022   CL 103 04/13/2022   CO2 27 04/13/2022   BUN 32 (H) 04/13/2022   CREATININE 1.10 04/13/2022   GLUCOSE 95 04/13/2022   Results for orders placed or performed during the hospital encounter of 04/13/22  Surgical pcr screen     Status: Abnormal   Collection Time: 04/13/22  9:20 AM   Specimen: Nasal Mucosa; Nasal Swab  Result Value Ref Range Status   MRSA, PCR POSITIVE (A) NEGATIVE Final    Comment: RESULT CALLED TO, READ BACK BY AND VERIFIED WITH: R.GOMEZ, RN AT 1417 ON 10.11.23 BY N.[email or web address]    Staphylococcus aureus POSITIVE (A) NEGATIVE Final    Comment: (NOTE) The Xpert SA Assay (FDA approved for NASAL specimens in patients 69 years of age and older), is one component of a comprehensive surveillance program. It is not intended to diagnose infection nor to guide or monitor treatment. Performed at [facility], [place] [address], [place], [place] [address]   Body fluid culture w Gram Stain     Status: None   Collection Time: 04/13/22 11:55 AM   Specimen: Synovium; Body Fluid  Result Value Ref Range Status   Specimen Description   Final    SYNOVIAL Performed at [facility] [address] [place], [place] [address]    Special Requests   Final    SYNOVIAL LEFT HIP JOINT FLUID Performed at [facility], [place] [address], [place], [place] [address]    Gram Stain   Final     FEW WBC PRESENT, PREDOMINANTLY PMN NO ORGANISMS SEEN    Culture   Final    NO GROWTH 3 DAYS Performed at [facility] [address], [place], [place] [address]    Report Status 04/16/2022 FINAL  Final  Aerobic/Anaerobic Culture w Gram Stain (surgical/deep wound)     Status: None   Collection Time: 04/13/22 11:58 AM   Specimen: Wound  Result Value Ref Range Status   Specimen Description   Final    WOUND Performed at [facility] [address], [place], [place] [address]    Special Requests  Final    SUPERFICIAL LEFT HIP Performed at [facility] [address], [place], [place] [address]    Gram Stain   Final    FEW WBC PRESENT,BOTH PMN AND MONONUCLEAR NO ORGANISMS SEEN    Culture   Final    No growth aerobically or anaerobically. Performed at [facility], [place] [address] [place], [place] [address]    Report Status 04/18/2022 FINAL  Final     Allergies as of 04/18/2022       Reactions   Aspirin Other (See Comments)   Unknown reaction - listed on [facility] 07/30/21   Ibuprofen Anaphylaxis   Clindamycin/lincomycin Other (See Comments)   Unknown reaction - listed on [facility] 07/30/21   Other Other (See Comments)   Unknown reaction to opioids - listed on [facility] 07/30/21 hallucinations   Sulfa Antibiotics Other (See Comments)   Unknown reaction - listed on [facility] 07/30/21   Tramadol Other (See Comments)   Hallucinations/ Currently taking for severe pain        Medication List     STOP taking these medications    amoxicillin 500 MG capsule Commonly known as: AMOXIL   mirabegron ER 25 MG Tb24 tablet Commonly known as: MYRBETRIQ       TAKE these medications    acetaminophen 650 MG CR tablet Commonly known as: TYLENOL Take 650-1,300 mg by mouth every 8 (eight) hours as needed for pain.   atorvastatin 10 MG tablet Commonly known as: LIPITOR Take 10 mg by mouth at bedtime.   cefadroxil 500 MG capsule Commonly known  as: DURICEF Take 1 capsule (500 mg total) by mouth 2 (two) times daily for 14 days.   ferrous sulfate 325 (65 FE) MG tablet Take 325 mg by mouth daily with breakfast.   furosemide 20 MG tablet Commonly known as: LASIX Take 20 mg by mouth at bedtime.   levothyroxine 50 MCG tablet Commonly known as: SYNTHROID Take 50 mcg by mouth every morning.   liothyronine 5 MCG tablet Commonly known as: CYTOMEL Take 5 mcg by mouth every morning.   melatonin 5 MG Tabs Take 5 mg by mouth at bedtime.   metoprolol tartrate 25 MG tablet Commonly known as: LOPRESSOR Take 1 tablet (25 mg total) by mouth 2 (two) times daily.   mirtazapine 15 MG tablet Commonly known as: REMERON Take 15 mg by mouth at bedtime.   pantoprazole 40 MG tablet Commonly known as: PROTONIX Take 40 mg by mouth every morning.   potassium chloride 10 MEQ tablet Commonly known as: KLOR-CON Take 10 mEq by mouth daily.   PRESCRIPTION MEDICATION Place 1 Application into the left ear 2 (two) times daily. Compound '@Custom'$  care Pharmacy Refrigerated capsule Ciprofloxacin/clotrimazole/Boric Acid and dexamethasone   traMADol 50 MG tablet Commonly known as: ULTRAM Take 25 mg by mouth daily as needed for severe pain.   Vitamin D (Ergocalciferol) 1.25 MG (50000 UNIT) Caps capsule Commonly known as: DRISDOL Take 1 capsule (50,000 Units total) by mouth every Tuesday.   warfarin 2 MG tablet Commonly known as: COUMADIN Take 2 mg by mouth See admin instructions. Take one tablet (2 mg) by mouth in the morning on Thursday and Tuesday (take 3 mg on Monday, Wednesday, Friday,Saturday and  Sunday in the morning)   warfarin 3 MG tablet Commonly known as: COUMADIN Take 3 mg by mouth See admin instructions. Take one tablet (3 mg) by mouth on Monday, Wednesday, Friday, Saturday and Sunday morning (take 2 mg on Thursday  and Tuesday morning)               Discharge Care Instructions  (From admission, onward)            Start     Ordered   04/18/22 0000  Weight bearing as tolerated        04/18/22 1600   04/18/22 0000  Change dressing       Comments: Do not change your dressing.   04/18/22 1600              WEIGHT BEARING   Weight bearing as tolerated with assist device (walker, cane, etc) as directed, use it as long as suggested by your surgeon or therapist, typically at least 4-6 weeks.   EXERCISES  Results after joint replacement surgery are often greatly improved when you follow the exercise, range of motion and muscle strengthening exercises prescribed by your doctor. Safety measures are also important to protect the joint from further injury. Any time any of these exercises cause you to have increased pain or swelling, decrease what you are doing until you are comfortable again and then slowly increase them. If you have problems or questions, call your caregiver or physical therapist for advice.   Rehabilitation is important following a joint replacement. After just a few days of immobilization, the muscles of the leg can become weakened and shrink (atrophy).  These exercises are designed to build up the tone and strength of the thigh and leg muscles and to improve motion. Often times heat used for twenty to thirty minutes before working out will loosen up your tissues and help with improving the range of motion but do not use heat for the first two weeks following surgery (sometimes heat can increase post-operative swelling).   These exercises can be done on a training (exercise) mat, on the floor, on a table or on a bed. Use whatever works the best and is most comfortable for you.    Use music or television while you are exercising so that the exercises are a pleasant break in your day. This will make your life better with the exercises acting as a break in your routine that you can look forward to.   Perform all exercises about fifteen times, three times per day or as directed.  You should exercise  both the operative leg and the other leg as well.  Exercises include:   Quad Sets - Tighten up the muscle on the front of the thigh (Quad) and hold for 5-10 seconds.   Straight Leg Raises - With your knee straight (if you were given a brace, keep it on), lift the leg to 60 degrees, hold for 3 seconds, and slowly lower the leg.  Perform this exercise against resistance later as your leg gets stronger.  Leg Slides: Lying on your back, slowly slide your foot toward your buttocks, bending your knee up off the floor (only go as far as is comfortable). Then slowly slide your foot back down until your leg is flat on the floor again.  Angel Wings: Lying on your back spread your legs to the side as far apart as you can without causing discomfort.  Hamstring Strength:  Lying on your back, push your heel against the floor with your leg straight by tightening up the muscles of your buttocks.  Repeat, but this time bend your knee to a comfortable angle, and push your heel against the floor.  You may put a pillow under the heel  to make it more comfortable if necessary.   A rehabilitation program following joint replacement surgery can speed recovery and prevent re-injury in the future due to weakened muscles. Contact your doctor or a physical therapist for more information on knee rehabilitation.    CONSTIPATION  Constipation is defined medically as fewer than three stools per week and severe constipation as less than one stool per week.  Even if you have a regular bowel pattern at home, your normal regimen is likely to be disrupted due to multiple reasons following surgery.  Combination of anesthesia, postoperative narcotics, change in appetite and fluid intake all can affect your bowels.   YOU MUST use at least one of the following options; they are listed in order of increasing strength to get the job done.  They are all available over the counter, and you may need to use some, POSSIBLY even all of these  options:    Drink plenty of fluids (prune juice may be helpful) and high fiber foods Colace 100 mg by mouth twice a day  Senokot for constipation as directed and as needed Dulcolax (bisacodyl), take with full glass of water  Miralax (polyethylene glycol) once or twice a day as needed.  If you have tried all these things and are unable to have a bowel movement in the first 3-4 days after surgery call either your surgeon or your primary doctor.    If you experience loose stools or diarrhea, hold the medications until you stool forms back up.  If your symptoms do not get better within 1 week or if they get worse, check with your doctor.  If you experience "the worst abdominal pain ever" or develop nausea or vomiting, please contact the office immediately for further recommendations for treatment.   ITCHING:  If you experience itching with your medications, try taking only a single pain pill, or even half a pain pill at a time.  You can also use Benadryl over the counter for itching or also to help with sleep.   TED HOSE STOCKINGS:  Use stockings on both legs until for at least 2 weeks or as directed by physician office. They may be removed at night for sleeping.  MEDICATIONS:  See your medication summary on the "After Visit Summary" that nursing will review with you.  You may have some home medications which will be placed on hold until you complete the course of blood thinner medication.  It is important for you to complete the blood thinner medication as prescribed.  PRECAUTIONS:  If you experience chest pain or shortness of breath - call 911 immediately for transfer to the hospital emergency department.   If you develop a fever greater that 101 F, purulent drainage from wound, increased redness or drainage from wound, foul odor from the wound/dressing, or calf pain - CONTACT YOUR SURGEON.                                                   FOLLOW-UP APPOINTMENTS:  If you do not already have a  post-op appointment, please call the office for an appointment to be seen by your surgeon.  Guidelines for how soon to be seen are listed in your "After Visit Summary", but are typically between 1-4 weeks after surgery.  OTHER INSTRUCTIONS:   Knee Replacement:  Do not place pillow under  knee, focus on keeping the knee straight while resting. CPM instructions: 0-90 degrees, 2 hours in the morning, 2 hours in the afternoon, and 2 hours in the evening. Place foam block, curve side up under heel at all times except when in CPM or when walking.  DO NOT modify, tear, cut, or change the foam block in any way.   MAKE SURE YOU:  Understand these instructions.  Get help right away if you are not doing well or get worse.    Thank you for letting us be a part of your medical care team.  It is a privilege we respect greatly.  We hope these instructions will help you stay on track for a fast and full recovery!   Diagnostic Studies: DG HIP UNILAT WITH PELVIS 2-3 VIEWS LEFT  Result Date: 04/13/2022 CLINICAL DATA:  Left hip aspiration EXAM: DG HIP (WITH OR WITHOUT PELVIS) 2-3V LEFT; DG C-ARM 1-60 MIN-NO REPORT COMPARISON:  None Available. FLUOROSCOPY: Air kerma 0.2061 mGy FINDINGS: Intraoperative fluoroscopic images of the left hip demonstrate needle placement over the acetabular component of left hip arthroplasty. IMPRESSION: Intraoperative fluoroscopic images of the left hip demonstrate needle placement over the acetabular component of left hip arthroplasty. Electronically Signed   By: Delanna Ahmadi M.D.   On: 04/13/2022 12:51   DG C-Arm 1-60 Min-No Report  Result Date: 04/13/2022 Fluoroscopy was utilized by the requesting physician.  No radiographic interpretation.    Disposition: Discharge disposition: 01-Home or Self Care       Discharge Instructions     Call MD / Call 911   Complete by: As directed    If you experience chest pain or shortness of breath, CALL 911 and be transported to the  hospital emergency room.  If you develope a fever above 101 F, pus (white drainage) or increased drainage or redness at the wound, or calf pain, call your surgeon's office.   Change dressing   Complete by: As directed    Do not change your dressing.   Constipation Prevention   Complete by: As directed    Drink plenty of fluids.  Prune juice may be helpful.  You may use a stool softener, such as Colace (over the counter) 100 mg twice a day.  Use MiraLax (over the counter) for constipation as needed.   Diet - low sodium heart healthy   Complete by: As directed    Discharge instructions   Complete by: As directed    Use cryotherapy as needed for pain and swelling.   Driving restrictions   Complete by: As directed    No driving for 6 weeks   Increase activity slowly as tolerated   Complete by: As directed    Lifting restrictions   Complete by: As directed    No lifting for 6 weeks   Post-operative opioid taper instructions:   Complete by: As directed    POST-OPERATIVE OPIOID TAPER INSTRUCTIONS: It is important to wean off of your opioid medication as soon as possible. If you do not need pain medication after your surgery it is ok to stop day one. Opioids include: Codeine, Hydrocodone(Norco, Vicodin), Oxycodone(Percocet, oxycontin) and hydromorphone amongst others.  Long term and even short term use of opiods can cause: Increased pain response Dependence Constipation Depression Respiratory depression And more.  Withdrawal symptoms can include Flu like symptoms Nausea, vomiting And more Techniques to manage these symptoms Hydrate well Eat regular healthy meals Stay active Use relaxation techniques(deep breathing, meditating, yoga) Do Not substitute Alcohol  to help with tapering If you have been on opioids for less than two weeks and do not have pain than it is ok to stop all together.  Plan to wean off of opioids This plan should start within one week post op of your joint  replacement. Maintain the same interval or time between taking each dose and first decrease the dose.  Cut the total daily intake of opioids by one tablet each day Next start to increase the time between doses. The last dose that should be eliminated is the evening dose.      Weight bearing as tolerated   Complete by: As directed         Follow-up Information     Rod Can, MD Follow up on 04/25/2022.   Specialty: Orthopedic Surgery Why: Follow-up within 7 days of discharge for removal of JP draina and removal of Prevena negative pressure dressing and wound vac and for wound re-check Contact information: [address] [address] [phone]                  Signed: Charlott Rakes, PA-C 04/22/2022, 5:17 PM

## 2022-07-01 ENCOUNTER — Ambulatory Visit: Payer: Medicare Other | Admitting: Podiatry

## 2022-07-08 ENCOUNTER — Ambulatory Visit (INDEPENDENT_AMBULATORY_CARE_PROVIDER_SITE_OTHER): Payer: Medicare Other | Admitting: Podiatry

## 2022-07-08 ENCOUNTER — Encounter: Payer: Self-pay | Admitting: Podiatry

## 2022-07-08 DIAGNOSIS — D689 Coagulation defect, unspecified: Secondary | ICD-10-CM

## 2022-07-08 DIAGNOSIS — R52 Pain, unspecified: Secondary | ICD-10-CM | POA: Diagnosis not present

## 2022-07-08 DIAGNOSIS — M79675 Pain in left toe(s): Secondary | ICD-10-CM | POA: Diagnosis not present

## 2022-07-08 DIAGNOSIS — M79674 Pain in right toe(s): Secondary | ICD-10-CM

## 2022-07-08 DIAGNOSIS — B351 Tinea unguium: Secondary | ICD-10-CM | POA: Diagnosis not present

## 2022-07-08 NOTE — Progress Notes (Signed)
This patient presents to the office with chief complaint of long thick painful nails.  Patient says the nails are painful walking and wearing shoes.  This patient is unable to self treat.  This patient is unable to trim his  nails since he is unable to reach his  nails.  he presents to the office for preventative foot care services. He presents to the office with his son.  General Appearance  Alert, conversant and in no acute stress.  Vascular  Dorsalis pedis and posterior tibial  pulses are diminished/absent palpable  bilaterally.  Capillary return is within normal limits  bilaterally. Cold feet  bilaterally.  Venous stasis dorsum of feet  B/L.  Neurologic  Senn-Weinstein monofilament wire test within normal limits  bilaterally. Muscle power within normal limits bilaterally.  Nails Thick disfigured discolored nails with subungual debris  from hallux to fifth toes bilaterally. No evidence of bacterial infection or drainage bilaterally.  Orthopedic  No limitations of motion  feet .  No crepitus or effusions noted.  No bony pathology or digital deformities noted.  HAV  B/L.  Skin  normotropic skin with no porokeratosis noted bilaterally.  No signs of infections or ulcers noted.   Pain posterior heels  B/L.  Onychomycosis  Nails  B/L.  Pain in right toes  Pain in left toes  Pressure pain.  ROV Debridement of nails both feet followed trimming the nails with dremel tool.    RTC 3 months.   Gardiner Barefoot DPM

## 2022-08-11 NOTE — Therapy (Signed)
OUTPATIENT SPEECH LANGUAGE PATHOLOGY [facility]   Patient Name: Craig Snow. MRN: [id number] DOB:Aug 22, 1920, 89 y.o., male Today's Date: 08/12/2022  PCP: Lawerance Cruel, MD REFERRING PROVIDER: Lawerance Cruel, MD  END OF SESSION:  End of Session - 08/12/22 1313     Visit Number 1    Authorization Type Medicare    SLP Start Time 83    SLP Stop Time  [id number]    SLP Time Calculation (min) 44 min    Activity Tolerance Patient tolerated treatment well             Past Medical History:  Diagnosis Date   Acquired thrombophilia ([place])    Acute lower UTI 07/30/2021   Arrhythmia    Atrial fibrillation ([facility])    Bladder cancer ([facility])    BPH (benign prostatic hyperplasia)    Candidiasis of penis 07/30/2021   Chronic ear infection, left    Community acquired pneumonia 07/30/2021   aspiration   GERD (gastroesophageal reflux disease)    Headache    History of radiation therapy    oral cancer   Hyperlipidemia    Hypertension    Indwelling Foley catheter present    Neoplasm of palate 2008   Poor dentition    Ruptured eardrum, left    absent eardrum   Supratherapeutic INR 07/30/2021   Thyrotoxicosis    Past Surgical History:  Procedure Laterality Date   ANTERIOR APPROACH HEMI HIP ARTHROPLASTY Left 10/23/2021   Procedure: ANTERIOR APPROACH HEMI HIP ARTHROPLASTY;  Surgeon: Rod Can, MD;  Location: [place];  Service: Orthopedics;  Laterality: Left;   INCISION AND DRAINAGE OF WOUND Left 04/13/2022   Procedure: IRRIGATION AND DEBRIDEMENT SUPERFICIAL HIP;  Surgeon: Rod Can, MD;  Location: [facility];  Service: Orthopedics;  Laterality: Left;  120   MOUTH SURGERY     cancer   Patient Active Problem List   Diagnosis Date Noted   Pressure-type pain 07/08/2022   Surgical wound dehiscence 04/13/2022   Pain due to onychomycosis of toenails of both feet 12/22/2021   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[place]) 12/22/2021   Femoral neck fracture ([place]) 10/22/2021   HLD  (hyperlipidemia) 10/22/2021   HTN (hypertension) 10/22/2021   Hypothyroidism 10/22/2021   Stage 1 skin ulcer of sacral region ([place]) 07/30/2021   History of bladder cancer 07/30/2021   Atrial fibrillation ([place]) 07/29/2021    ONSET DATE: referred 08/10/2022  REFERRING DIAG: R47.9 (ICD-10-CM) - Unspecified speech disturbances   THERAPY DIAG:  No diagnosis found.  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 No  FALLS: Has patient fallen in last 6 months?  No  LIVING ENVIRONMENT: Lives with: lives with their family Lives in: House/apartment  PLOF:  Level of assistance: Needed assistance with ADLs, Needed assistance with IADLS Employment: Retired  PATIENT GOALS: Improve pt safety when swallowing  OBJECTIVE:   DIAGNOSTIC FINDINGS: 02/17/2022 MBSS Clinical Impression: Radiographic findings reveal a mod-severe oral dysphagia and moderate pharyngeal  dysphagia. Oral phase was characterized by consistent prolonged A-P transit, oral residue, delayed swallow initiation with thin and nectar thick liquid and puree consistency. Mechanical soft bolus was removed from the oral cavity secondary to absent mastication. Veteran was unable to clear the oral cavity of residue. Pharyngeal phase was characterized by significantly decreased laryngeal elevation, allowing for  a mod-severe amount of vallecular residue with each bolus presentation along with mild amounts of pyriform sinus residue. Veteran was cued to perform multiple swallows, which did not help. No aspiration was observed, however Veteran is at significant risk for aspiration.   MOTOR SPEECH: assessed across variety of speech tasks: reading, word repetition, generative discourse sample Overall motor  speech: impaired Rate of Speech: WFL Dysfluencies: none evidenced Phonation: low vocal intensity Resonance: nasal (hx of soft palate carcinoma)  Voice Quality: normal Intelligibility: Intelligibility reduced, 70% Motor planning: Appears intact Interfering components: hearing loss, prior radiation tx for HNC  CLINICAL SWALLOW ASSESSMENT:   Current diet: Dysphagia 1 (puree) and thin liquids Dentition: edentulous Patient directly observed with POs: Yes: dysphagia 1 (puree) and thin liquids  Feeding: able to feed self Liquids provided by: cup, sequential sips Oral phase signs and symptoms: Overall WFL for presented boluses (although per chart review, pt does not masticate at baseline), although with some oral residue 2/2 reduced tongue ROM Pharyngeal phase signs and symptoms: WFL (clinical swallow eval)  ORAL MOTOR EXAMINATION: Overall status: Impaired:   Lingual: Bilateral (ROM and Strength) Velum: Symmetry Mandible: ROM  Comments: Reduced ROM in tongue; soft palate noted to raise symmetrically though velum deviates to L at rest   PATIENT REPORTED OUTCOME MEASURES (PROM): Deferred   TODAY'S TREATMENT:                                                                                                                                         DATE:   08-12-22: Pt accompanied by son. Pt has new dentures after dental surgery and obturator fits better since previous ST visit in September. Son reported that "saliva has been building up in his mouth". Breakfast: scrambled eggs, oatmeal, coffee; lunch: protein (beans, pureed chicken, shrimp beef, etc.), soft veggies with cream cheese, soup. Pt isn't chewing, although son endorsed that his swallowing has improved, as he's drinking thin liquids and no longer having to puree oatmeal. Coughs "sometimes", but "not very much". Per son, pt struggles to use straw. SLP observed pt self admin of sips of thin liquid (water) and spoonfuls of puree (apple sauce).  Despite thorough challenging via sequential sips and bites, no overt s/sx of aspiration appreciated across trials.  Question impact of obturator on palatal seal impacting reported difficulty with drinking from straw.  Suggested swallow instrumental to objectively assess swallow function and safety- pt's son declines at this time. SLP provided education on aspiration precautions, to be aware of increased difficulty, weight loss, coughing/choking. Recommend gradual progression to soft solids (only if pt desires).    PATIENT EDUCATION: Education details: See above Person educated: Patient and Building control surveyor Son Education method: Customer service manager Education comprehension: verbalized understanding, needs further education, and visual cues required (HOH)  HOME EXERCISE PROGRAM: Tongue and jaw stretches, passive and active   ASSESSMENT:  CLINICAL IMPRESSION: Patient is a 89 y.o.  male who was seen today for oropharyngeal dysphagia. Clinical swallow evaluation completed with no overt s/sx of aspiration. Pt demonstrates oral clearance of all boluses, reports all bolus trials feel normal going down without globus or residue sensation. Mild oral residue following puree, likely 2/2 reduced tongue ROM. Advised to ensure oral care following PO to clear oral cavity of all remaining debris. Provided education on stretching for reduced ROM in jar/tongue. Though limited, appear to be sufficient for pt's current diet. Son deferred instrumental at this time d/t reported improvements at home and no overt challenges noted during clinical swallow evaluation. Usual anterior loss of secretions noted. Deferred therapy at this time, feel comfortable implemented exercises following direct instruction and modeling.   OBJECTIVE IMPAIRMENTS: Objective impairments include dysphagia. These impairments are limiting patient from safety when swallowing.Factors affecting potential to achieve goals and functional outcome are  previous level of function. Patient and son decline ST interventions at this time, are pleased with progression in oral intake following dental surgery.    PLAN:  SLP FREQUENCY: one time visit  SLP DURATION: other: eval and discharge   Su Monks, [place] 08/12/2022, 2:54 PM

## 2022-08-12 ENCOUNTER — Ambulatory Visit: Payer: Medicare Other | Attending: Family Medicine | Admitting: Speech Pathology

## 2022-08-12 ENCOUNTER — Other Ambulatory Visit: Payer: Self-pay

## 2022-08-12 DIAGNOSIS — R1310 Dysphagia, unspecified: Secondary | ICD-10-CM | POA: Diagnosis present

## 2022-08-12 DIAGNOSIS — R471 Dysarthria and anarthria: Secondary | ICD-10-CM | POA: Diagnosis present

## 2022-08-12 NOTE — Patient Instructions (Signed)
   STRETCHES FOR TONGUE MOTION Tongue stretches will help maintain range of motion in the muscles. Protrusion: Stick tongue out and hold 5 seconds (work up to 10 seconds) x5  Lateralization: Move tongue side to side 10 times.  Can use guaze or tissue to hold tongue to help with stretch    STRETCHES FOR JAW OPENING Open jaw as wide as can and hold for 5 seconds x5 Open jaw as wide as can and apply gentle downward pressure to open a bit more for 5 seconds x3

## 2022-10-12 ENCOUNTER — Ambulatory Visit: Payer: Medicare Other | Admitting: Podiatry

## 2022-10-21 ENCOUNTER — Ambulatory Visit (INDEPENDENT_AMBULATORY_CARE_PROVIDER_SITE_OTHER): Payer: Medicare Other | Admitting: Podiatry

## 2022-10-21 ENCOUNTER — Encounter: Payer: Self-pay | Admitting: Podiatry

## 2022-10-21 DIAGNOSIS — D689 Coagulation defect, unspecified: Secondary | ICD-10-CM

## 2022-10-21 DIAGNOSIS — M79675 Pain in left toe(s): Secondary | ICD-10-CM | POA: Diagnosis not present

## 2022-10-21 DIAGNOSIS — M79674 Pain in right toe(s): Secondary | ICD-10-CM

## 2022-10-21 DIAGNOSIS — B351 Tinea unguium: Secondary | ICD-10-CM

## 2022-10-21 NOTE — Progress Notes (Signed)
This patient presents to the office with chief complaint of long thick painful nails.  Patient says the nails are painful walking and wearing shoes.  This patient is unable to self treat.  This patient is unable to trim his  nails since he is unable to reach his  nails.  he presents to the office for preventative foot care services. He presents to the office with his son.  General Appearance  Alert, conversant and in no acute stress.  Vascular  Dorsalis pedis and posterior tibial  pulses are diminished/absent palpable  bilaterally.  Capillary return is within normal limits  bilaterally. Cold feet  bilaterally.  Venous stasis dorsum of feet  B/L.  Neurologic  Senn-Weinstein monofilament wire test within normal limits  bilaterally. Muscle power within normal limits bilaterally.  Nails Thick disfigured discolored nails with subungual debris  from hallux to fifth toes bilaterally. No evidence of bacterial infection or drainage bilaterally.  Orthopedic  No limitations of motion  feet .  No crepitus or effusions noted.  No bony pathology or digital deformities noted.  HAV  B/L.  Skin  normotropic skin with no porokeratosis noted bilaterally.  No signs of infections or ulcers noted.   Pain posterior heels  B/L.  Onychomycosis  Nails  B/L.  Pain in right toes  Pain in left toes  Pressure pain.  Debridement of nails both feet followed trimming the nails with dremel tool.    RTC 3 months.   Helane Gunther DPM

## 2023-01-20 ENCOUNTER — Ambulatory Visit (INDEPENDENT_AMBULATORY_CARE_PROVIDER_SITE_OTHER): Payer: Medicare Other | Admitting: Podiatry

## 2023-01-20 ENCOUNTER — Encounter: Payer: Self-pay | Admitting: Podiatry

## 2023-01-20 DIAGNOSIS — R52 Pain, unspecified: Secondary | ICD-10-CM

## 2023-01-20 DIAGNOSIS — D689 Coagulation defect, unspecified: Secondary | ICD-10-CM | POA: Diagnosis not present

## 2023-01-20 DIAGNOSIS — M79675 Pain in left toe(s): Secondary | ICD-10-CM | POA: Diagnosis not present

## 2023-01-20 DIAGNOSIS — B351 Tinea unguium: Secondary | ICD-10-CM

## 2023-01-20 DIAGNOSIS — M79674 Pain in right toe(s): Secondary | ICD-10-CM

## 2023-01-20 NOTE — Progress Notes (Signed)
This patient presents to the office with chief complaint of long thick painful nails.  Patient says the nails are painful walking and wearing shoes.  This patient is unable to self treat.  This patient is unable to trim his  nails since he is unable to reach his  nails.  he presents to the office for preventative foot care services. He presents to the office with his son.  General Appearance  Alert, conversant and in no acute stress.  Vascular  Dorsalis pedis and posterior tibial  pulses are diminished/absent palpable  bilaterally.  Capillary return is within normal limits  bilaterally. Cold feet  bilaterally.  Venous stasis dorsum of feet  B/L.  Neurologic  Senn-Weinstein monofilament wire test within normal limits  bilaterally. Muscle power within normal limits bilaterally.  Nails Thick disfigured discolored nails with subungual debris  from hallux to fifth toes bilaterally. No evidence of bacterial infection or drainage bilaterally.  Orthopedic  No limitations of motion  feet .  No crepitus or effusions noted.  No bony pathology or digital deformities noted.  HAV  B/L.  Skin  normotropic skin with no porokeratosis noted bilaterally.  No signs of infections or ulcers noted.   Pain posterior heels  B/L.  Onychomycosis  Nails  B/L.  Pain in right toes  Pain in left toes  Pressure pain.  Debridement of nails both feet followed trimming the nails with dremel tool.    RTC 3 months.   Helane Gunther DPM

## 2023-03-31 IMAGING — DX DG KNEE 1-2V PORT*L*
2 series · 2 of 2 positions shown · non-contrast
Comparison: No priors.

CLINICAL DATA: [AGE] male with history of displaced fracture
of the left femoral neck.

EXAM:
PORTABLE LEFT KNEE - 1-2 VIEW

[knee ap]
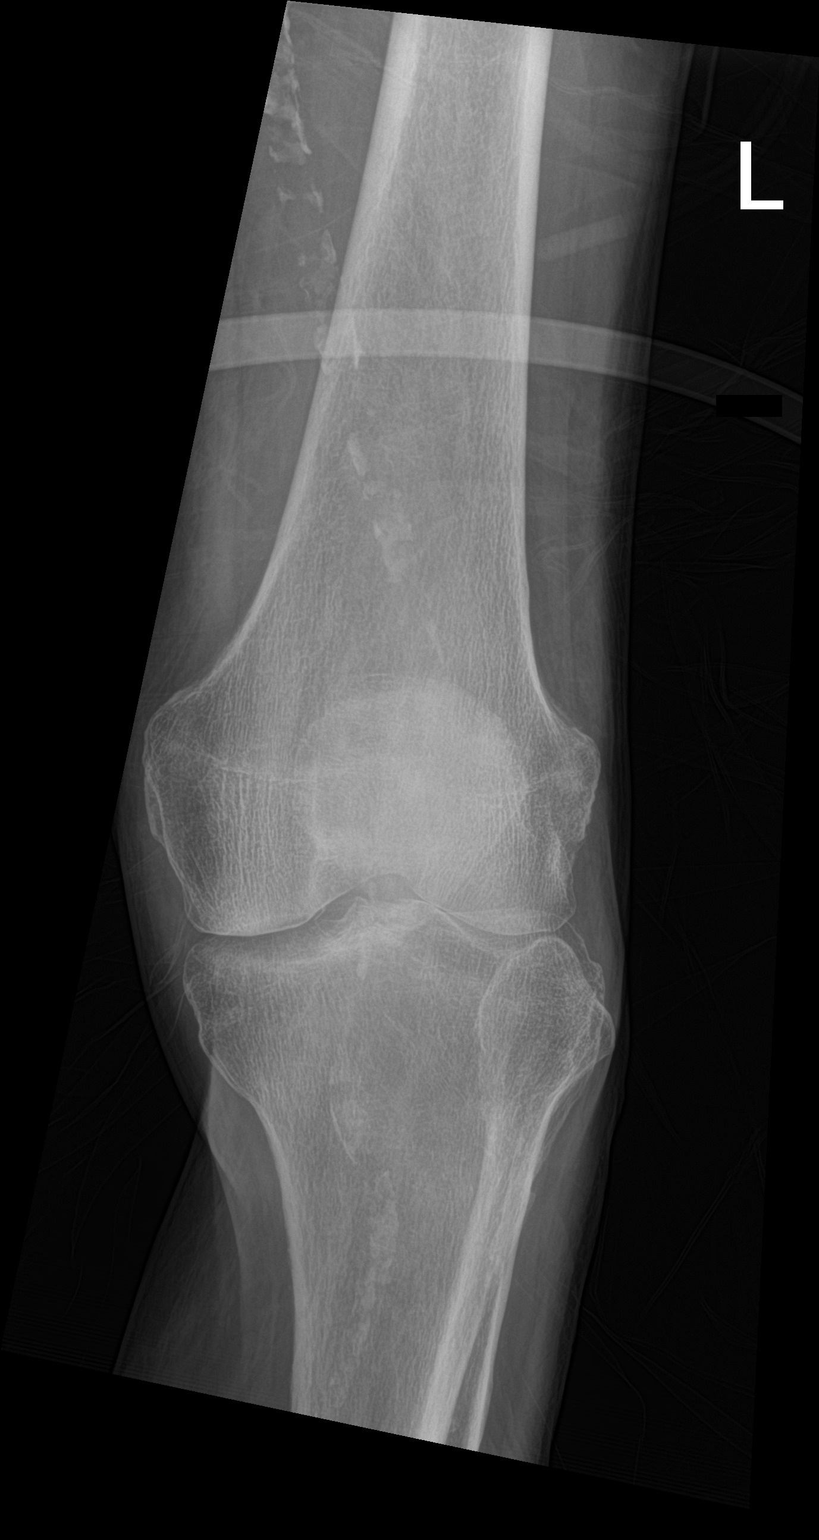

[knee lat]
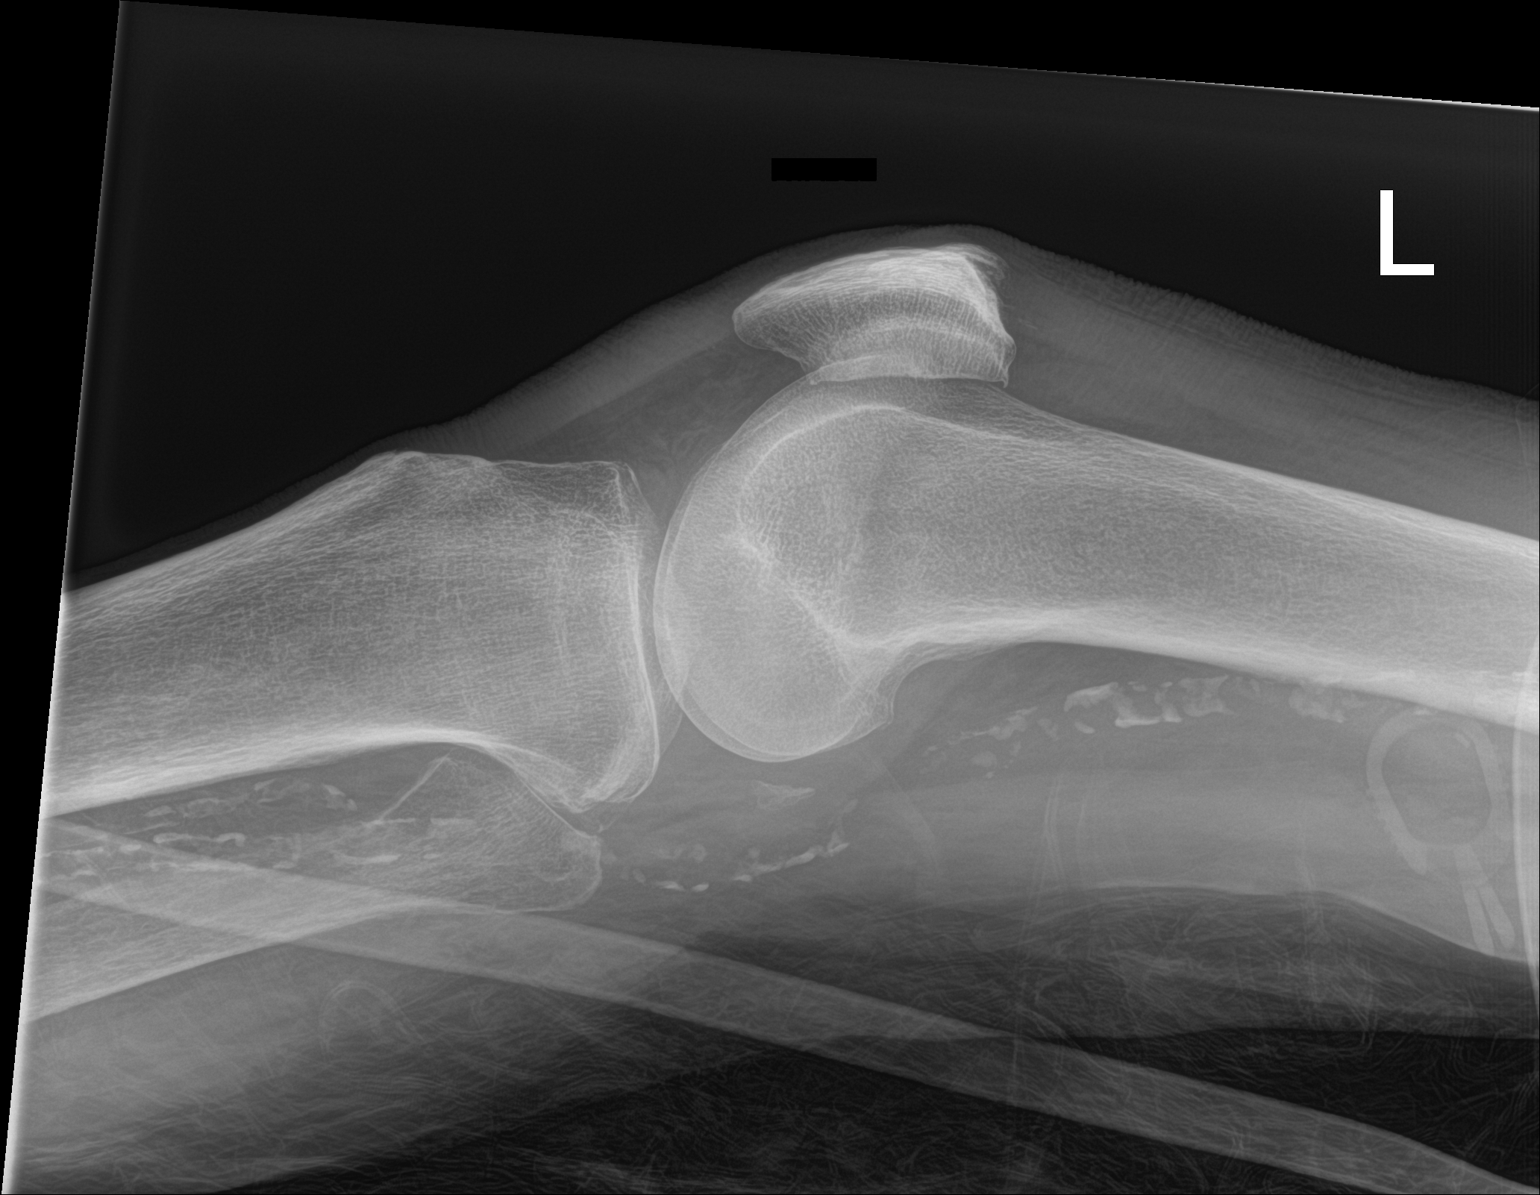

[2 of 2 positions shown; findings below may reference images not displayed]

FINDINGS: Two views of the left knee demonstrate no acute displaced fracture,
subluxation or dislocation. There is joint space narrowing,
subchondral sclerosis, subchondral cyst formation and osteophyte
formation, most severe in the medial and patellofemoral compartment.
Extensive atherosclerosis noted in the visualized vasculature.
IMPRESSION: 1. No acute radiographic abnormality of the left knee.
2. Tricompartmental osteoarthritis, most severe in the medial and
patellofemoral compartments.
3. Advanced atherosclerosis.

## 2023-03-31 IMAGING — DX DG PORTABLE PELVIS
1 series · 2 of 2 positions shown · non-contrast
Comparison: 10/22/2021

CLINICAL DATA: Status post left hip arthroplasty

EXAM:
PORTABLE PELVIS 1-2 VIEWS

[Series 1: pelvis · 0.14mm/px · 2 of 2 slices shown]
[im 1/2]
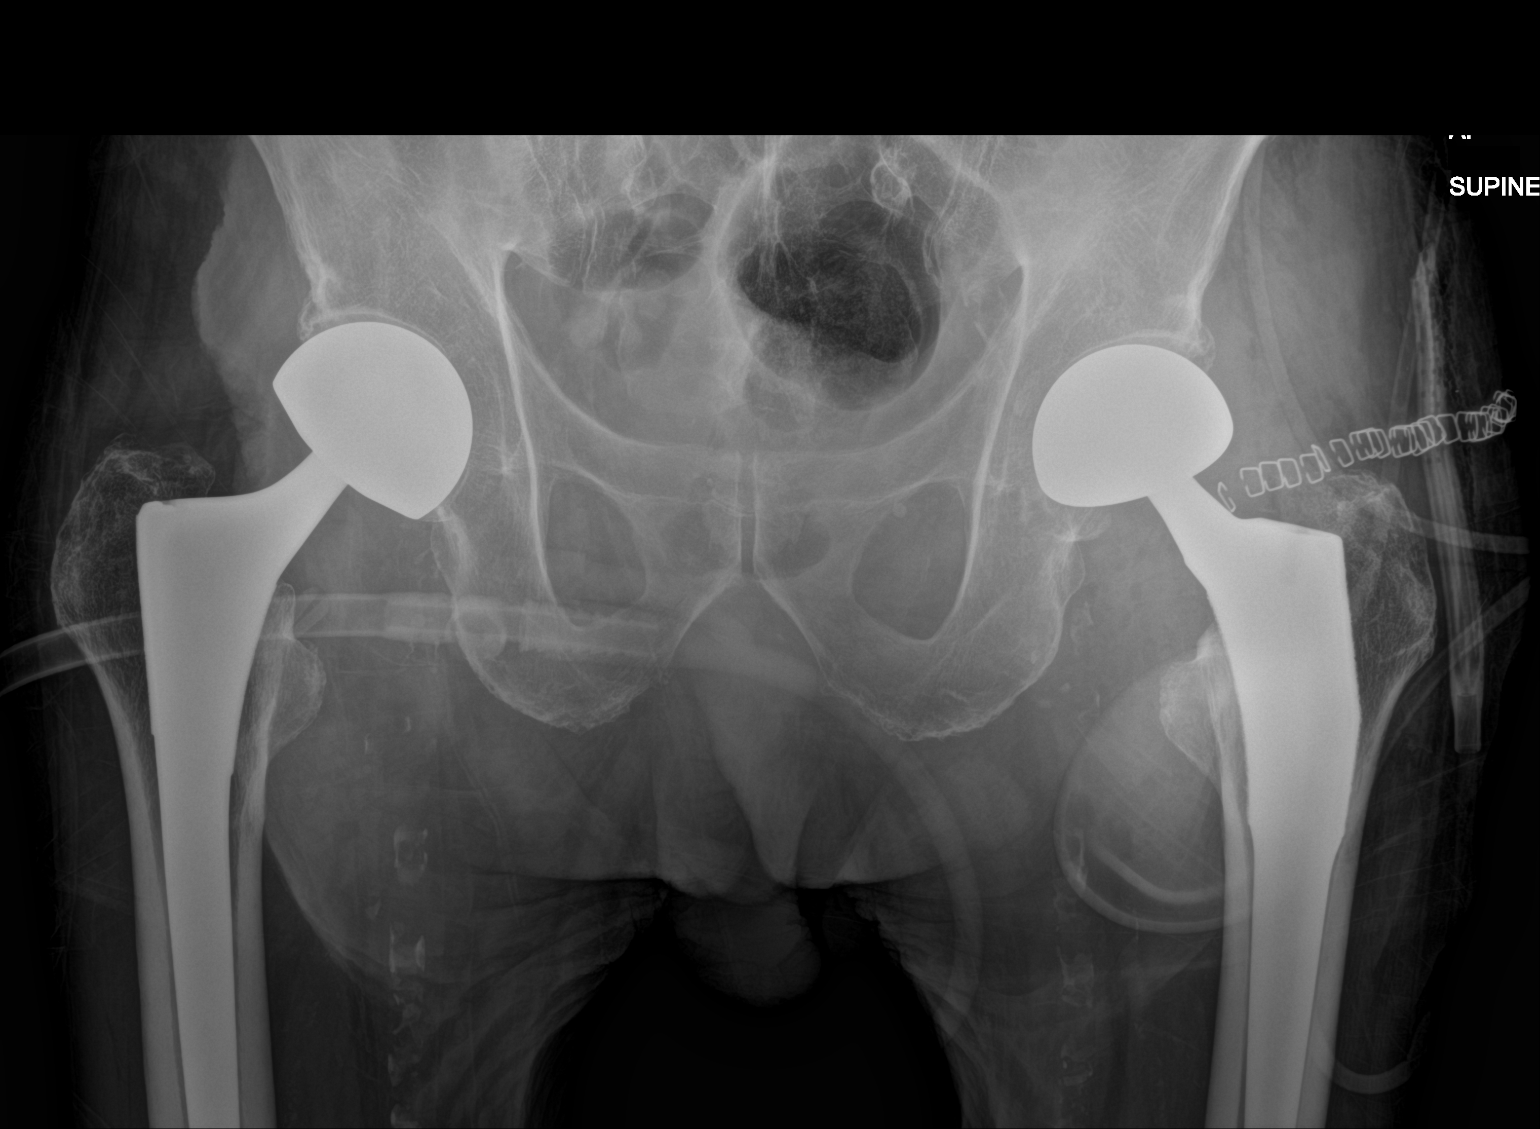
[im 2/2]
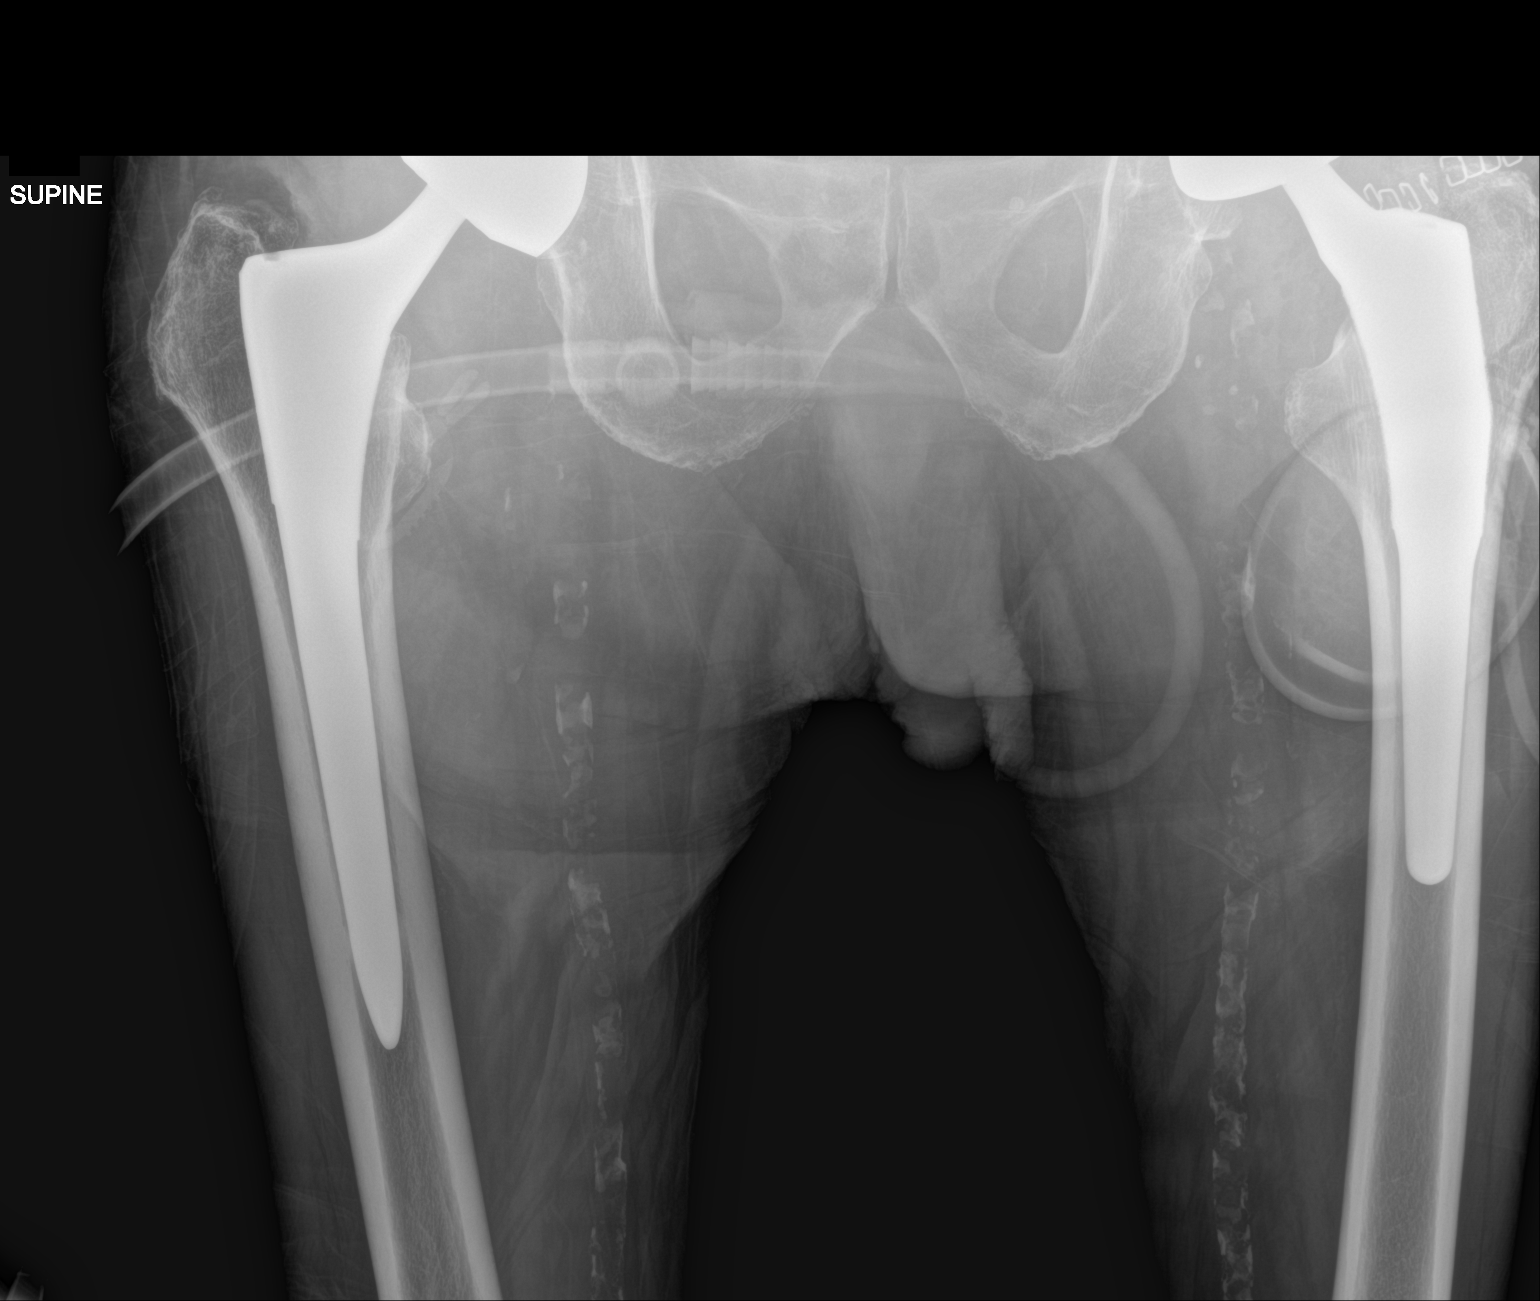

[2 of 2 positions shown; findings below may reference images not displayed]

FINDINGS: There is interval left hip arthroplasty. Skin staples are noted.
There is previous right hip arthroplasty. Extensive arterial
calcifications are seen in the soft tissues.
IMPRESSION: Status post left hip arthroplasty.

## 2023-03-31 IMAGING — RF DG HIP (WITH OR WITHOUT PELVIS) 2-3V*L*
1 series · 5 of 5 positions shown · non-contrast
Comparison: None.

CLINICAL DATA: Fracture left femur

EXAM:
DG HIP (WITH OR WITHOUT PELVIS) 2-3V LEFT

[Series 1: dg x-ray · left · 0.20mm/px · 5 of 5 slices shown]
[im 1/5]
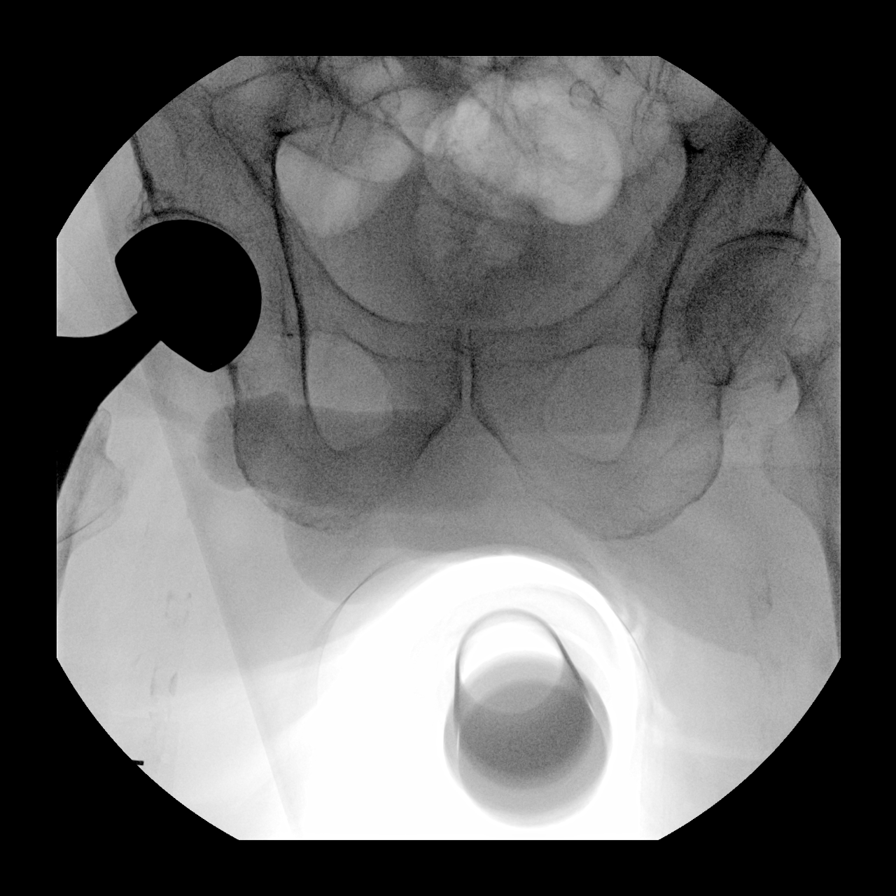
[im 2/5]
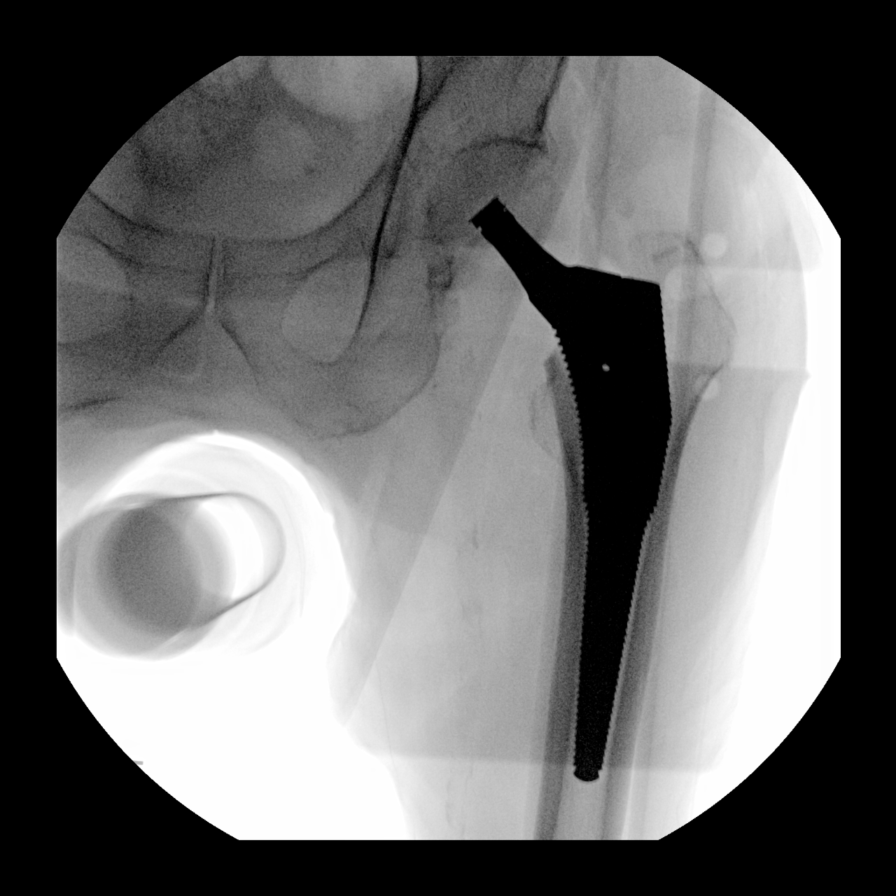
[im 3/5]
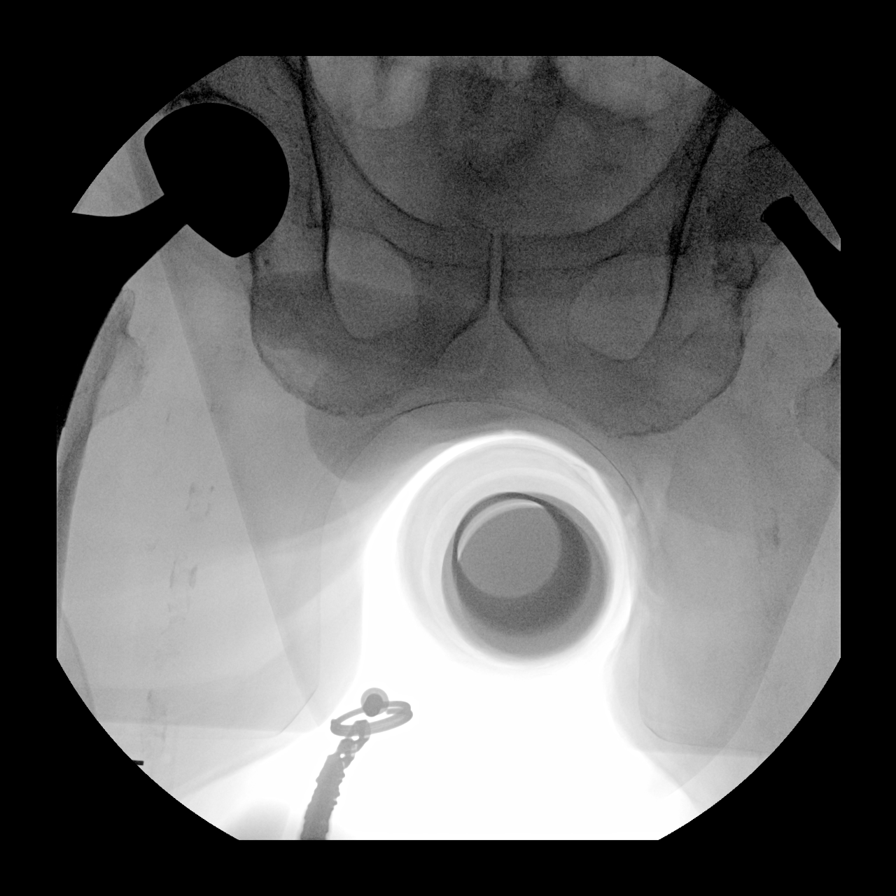
[im 4/5]
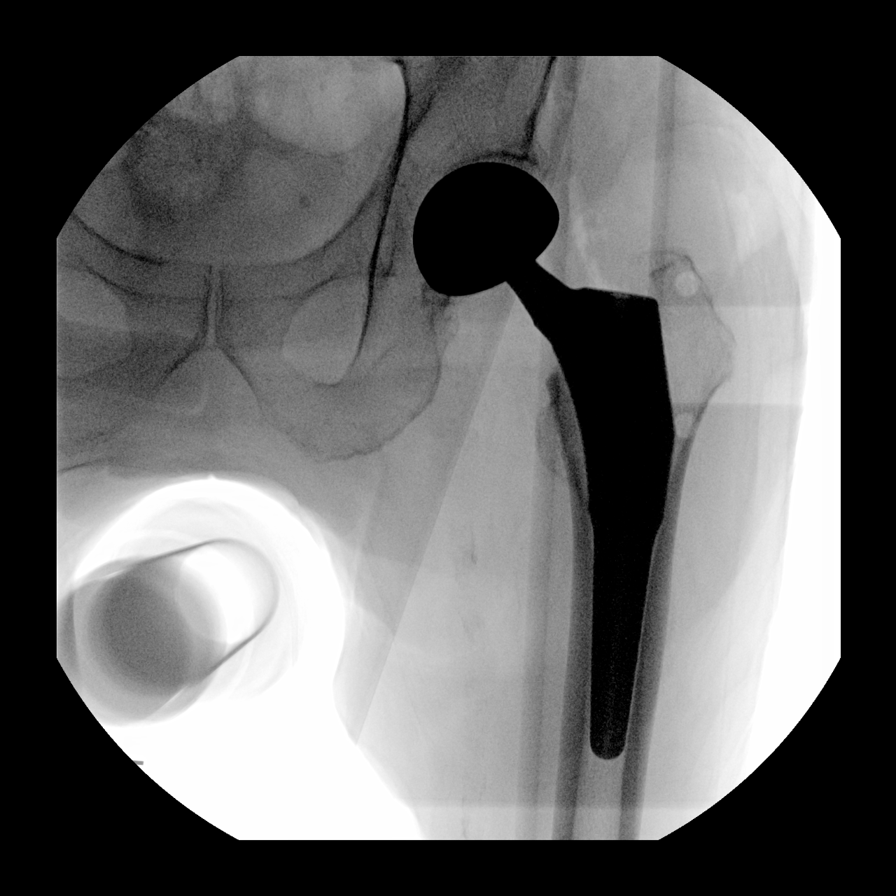
[im 5/5]
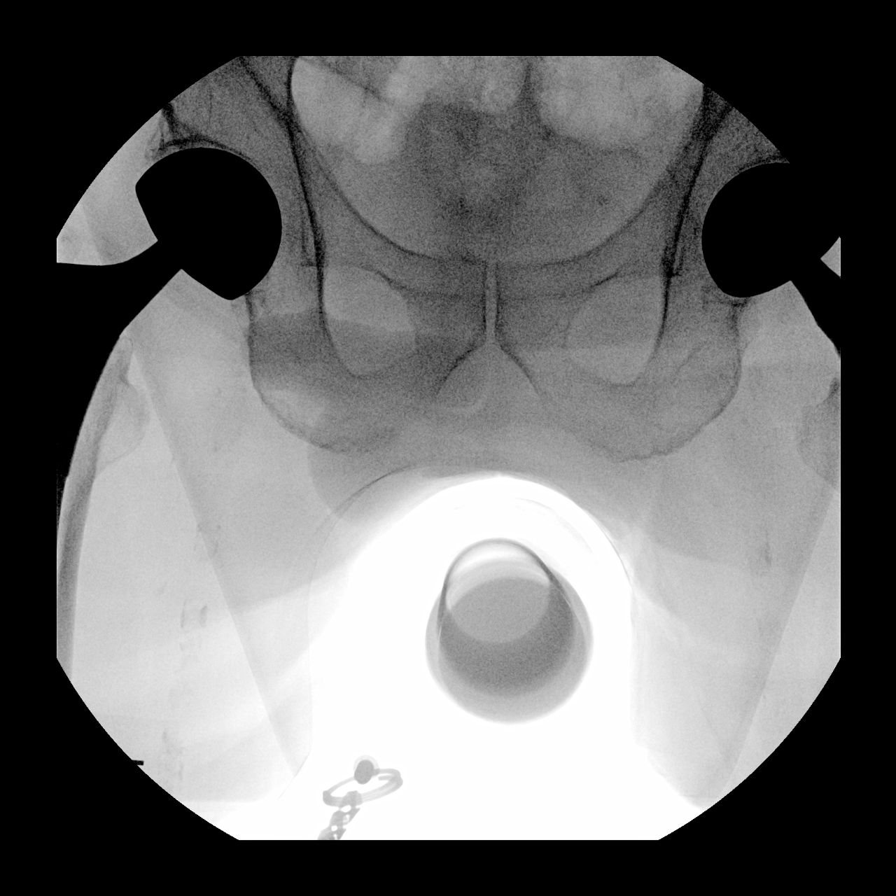

[5 of 5 positions shown; findings below may reference images not displayed]

FINDINGS: Fluoroscopic images show left hip arthroplasty. There is previous
right hip arthroplasty. Fluoroscopic time was 7 seconds. Radiation
dose was 0.25 mGy.
IMPRESSION: Fluoroscopic assistance was provided for left hip arthroplasty.

## 2023-04-24 ENCOUNTER — Emergency Department (HOSPITAL_COMMUNITY): Payer: Medicare Other

## 2023-04-24 ENCOUNTER — Inpatient Hospital Stay (HOSPITAL_COMMUNITY)
Admission: EM | Admit: 2023-04-24 | Discharge: 2023-04-28 | DRG: 699 | Disposition: A | Discharge status: Home Health | Payer: Medicare Other | Attending: Family Medicine | Admitting: Family Medicine

## 2023-04-24 ENCOUNTER — Encounter (HOSPITAL_COMMUNITY): Payer: Self-pay

## 2023-04-24 DIAGNOSIS — I480 Paroxysmal atrial fibrillation: Secondary | ICD-10-CM | POA: Diagnosis present

## 2023-04-24 DIAGNOSIS — Z87891 Personal history of nicotine dependence: Secondary | ICD-10-CM | POA: Diagnosis not present

## 2023-04-24 DIAGNOSIS — Z885 Allergy status to narcotic agent status: Secondary | ICD-10-CM

## 2023-04-24 DIAGNOSIS — Y846 Urinary catheterization as the cause of abnormal reaction of the patient, or of later complication, without mention of misadventure at the time of the procedure: Secondary | ICD-10-CM | POA: Diagnosis present

## 2023-04-24 DIAGNOSIS — E785 Hyperlipidemia, unspecified: Secondary | ICD-10-CM | POA: Diagnosis present

## 2023-04-24 DIAGNOSIS — Z923 Personal history of irradiation: Secondary | ICD-10-CM

## 2023-04-24 DIAGNOSIS — K219 Gastro-esophageal reflux disease without esophagitis: Secondary | ICD-10-CM | POA: Diagnosis present

## 2023-04-24 DIAGNOSIS — N390 Urinary tract infection, site not specified: Secondary | ICD-10-CM | POA: Diagnosis present

## 2023-04-24 DIAGNOSIS — R4701 Aphasia: Secondary | ICD-10-CM | POA: Diagnosis present

## 2023-04-24 DIAGNOSIS — Z79890 Hormone replacement therapy (postmenopausal): Secondary | ICD-10-CM

## 2023-04-24 DIAGNOSIS — N400 Enlarged prostate without lower urinary tract symptoms: Secondary | ICD-10-CM | POA: Diagnosis present

## 2023-04-24 DIAGNOSIS — R531 Weakness: Secondary | ICD-10-CM | POA: Diagnosis present

## 2023-04-24 DIAGNOSIS — Z881 Allergy status to other antibiotic agents status: Secondary | ICD-10-CM | POA: Diagnosis not present

## 2023-04-24 DIAGNOSIS — Z85819 Personal history of malignant neoplasm of unspecified site of lip, oral cavity, and pharynx: Secondary | ICD-10-CM

## 2023-04-24 DIAGNOSIS — Z7901 Long term (current) use of anticoagulants: Secondary | ICD-10-CM | POA: Diagnosis not present

## 2023-04-24 DIAGNOSIS — Z882 Allergy status to sulfonamides status: Secondary | ICD-10-CM

## 2023-04-24 DIAGNOSIS — Z79899 Other long term (current) drug therapy: Secondary | ICD-10-CM

## 2023-04-24 DIAGNOSIS — Z96642 Presence of left artificial hip joint: Secondary | ICD-10-CM | POA: Diagnosis present

## 2023-04-24 DIAGNOSIS — I10 Essential (primary) hypertension: Secondary | ICD-10-CM | POA: Diagnosis present

## 2023-04-24 DIAGNOSIS — I4891 Unspecified atrial fibrillation: Secondary | ICD-10-CM | POA: Diagnosis present

## 2023-04-24 DIAGNOSIS — H9190 Unspecified hearing loss, unspecified ear: Secondary | ICD-10-CM | POA: Diagnosis present

## 2023-04-24 DIAGNOSIS — E039 Hypothyroidism, unspecified: Secondary | ICD-10-CM | POA: Diagnosis present

## 2023-04-24 DIAGNOSIS — Z681 Body mass index (BMI) 19 or less, adult: Secondary | ICD-10-CM

## 2023-04-24 DIAGNOSIS — Z886 Allergy status to analgesic agent status: Secondary | ICD-10-CM

## 2023-04-24 DIAGNOSIS — B9689 Other specified bacterial agents as the cause of diseases classified elsewhere: Secondary | ICD-10-CM | POA: Diagnosis present

## 2023-04-24 DIAGNOSIS — R339 Retention of urine, unspecified: Secondary | ICD-10-CM | POA: Diagnosis present

## 2023-04-24 DIAGNOSIS — R627 Adult failure to thrive: Secondary | ICD-10-CM | POA: Diagnosis present

## 2023-04-24 DIAGNOSIS — Z66 Do not resuscitate: Secondary | ICD-10-CM | POA: Diagnosis present

## 2023-04-24 DIAGNOSIS — T83511A Infection and inflammatory reaction due to indwelling urethral catheter, initial encounter: Principal | ICD-10-CM | POA: Diagnosis present

## 2023-04-24 DIAGNOSIS — Z8551 Personal history of malignant neoplasm of bladder: Secondary | ICD-10-CM

## 2023-04-24 LAB — COMPREHENSIVE METABOLIC PANEL
ALT: 21 U/L (ref 0–44)
AST: 30 U/L (ref 15–41)
Albumin: 4.2 g/dL (ref 3.5–5.0)
Alkaline Phosphatase: 97 U/L (ref 38–126)
Anion gap: 11 (ref 5–15)
BUN: 29 mg/dL — ABNORMAL HIGH (ref 8–23)
CO2: 26 mmol/L (ref 22–32)
Calcium: 10.3 mg/dL (ref 8.9–10.3)
Chloride: 99 mmol/L (ref 98–111)
Creatinine, Ser: 1.02 mg/dL (ref 0.61–1.24)
GFR, Estimated: >60 mL/min (ref >60)
Glucose, Bld: 94 mg/dL (ref 70–99)
Potassium: 4.2 mmol/L (ref 3.5–5.1)
Sodium: 136 mmol/L (ref 135–145)
Total Bilirubin: 1 mg/dL (ref 0.3–1.2)
Total Protein: 7.5 g/dL (ref 6.5–8.1)

## 2023-04-24 LAB — URINALYSIS, ROUTINE W REFLEX MICROSCOPIC
Bilirubin Urine: NEGATIVE
Glucose, UA: NEGATIVE mg/dL
Ketones, ur: NEGATIVE mg/dL
Nitrite: POSITIVE — AB
Protein, ur: 30 mg/dL — AB
Specific Gravity, Urine: 1.014 (ref 1.005–1.030)
WBC, UA: >50 WBC/hpf (ref 0–5)
pH: 6 (ref 5.0–8.0)

## 2023-04-24 LAB — CBC
HCT: 40.6 % (ref 39.0–52.0)
Hemoglobin: 13.3 g/dL (ref 13.0–17.0)
MCH: 35.8 pg — ABNORMAL HIGH (ref 26.0–34.0)
MCHC: 32.8 g/dL (ref 30.0–36.0)
MCV: 109.4 fL — ABNORMAL HIGH (ref 80.0–100.0)
Platelets: 155 10*3/uL (ref 150–400)
RBC: 3.71 MIL/uL — ABNORMAL LOW (ref 4.22–5.81)
RDW: 13.2 % (ref 11.5–15.5)
WBC: 7.9 10*3/uL (ref 4.0–10.5)
nRBC: 0 % (ref 0.0–0.2)

## 2023-04-24 LAB — CBG MONITORING, ED: Glucose-Capillary: 97 mg/dL (ref 70–99)

## 2023-04-24 MED ORDER — SODIUM CHLORIDE 0.9 % IV SOLN
1.0000 g | Freq: Once | INTRAVENOUS | Status: AC
  Administered 2023-04-25: 1 g via INTRAVENOUS
  Filled 2023-04-24: qty 10

## 2023-04-24 MED ORDER — SODIUM CHLORIDE 0.9 % IV BOLUS
500.0000 mL | Freq: Once | INTRAVENOUS | Status: AC
Start: 2023-04-25 — End: 2023-04-25
  Administered 2023-04-25: 500 mL via INTRAVENOUS

## 2023-04-24 MED ORDER — IOHEXOL 350 MG/ML SOLN
75.0000 mL | Freq: Once | INTRAVENOUS | Status: AC | PRN
  Administered 2023-04-24: 75 mL via INTRAVENOUS

## 2023-04-24 NOTE — ED Provider Notes (Signed)
[place] EMERGENCY DEPARTMENT AT [facility] Provider Note   CSN: [id number] Arrival date & time: 04/24/23  1802     History  Chief Complaint  Patient presents with   Aphasia    Craig Snow. is a 89 y.o. male.  Pt is a 89 yo male with pmhx significant for urinary retention with indwelling foley, hld, htn, afib (on coumadin), mouth cancer, Snow, bladder cancer, and gerd.  Pt's son gives the hx.  He noticed that pt developed weakness in his left leg on Friday afternoon ( 10/18).  The son said that lasted a few hours.  Today, he had some slurred speech which lasted about 2 hrs.  That has resolved now.  Pt denies any pain.  Son said he's not been eating or drinking for the past 2 days.        Home Medications Prior to Admission medications   Medication Sig Start Date End Date Taking? Authorizing Provider  ciprofloxacin (CIPRO) 250 MG tablet Take 250 mg by mouth 2 (two) times daily. 03/30/23  Yes [provider]  acetaminophen (TYLENOL) 650 MG CR tablet Take 650-1,300 mg by mouth every 8 (eight) hours as needed for pain.    [provider]  atorvastatin (LIPITOR) 10 MG tablet Take 10 mg by mouth at bedtime.    [provider]  ferrous sulfate 325 (65 FE) MG tablet Take 325 mg by mouth daily with breakfast.    [provider]  furosemide (LASIX) 20 MG tablet Take 20 mg by mouth at bedtime.    [provider]  levothyroxine (SYNTHROID) 50 MCG tablet Take 50 mcg by mouth every morning.    [provider]  liothyronine (CYTOMEL) 5 MCG tablet Take 5 mcg by mouth every morning.    [provider]  melatonin 5 MG TABS Take 5 mg by mouth at bedtime.    [provider]  metoprolol tartrate (LOPRESSOR) 25 MG tablet Take 1 tablet (25 mg total) by mouth 2 (two) times daily. 08/06/21   Willeen Niece, MD  mirtazapine (REMERON) 15 MG tablet Take 15 mg by mouth at bedtime.    [provider]  pantoprazole  (PROTONIX) 40 MG tablet Take 40 mg by mouth every morning.    [provider]  potassium chloride (KLOR-CON) 10 MEQ tablet Take 10 mEq by mouth daily.    [provider]  PRESCRIPTION MEDICATION Place 1 Application into the left ear 2 (two) times daily. Compound @[facility] Refrigerated capsule Ciprofloxacin/clotrimazole/Boric Acid and dexamethasone    [provider]  traMADol (ULTRAM) 50 MG tablet Take 25 mg by mouth daily as needed for severe pain.    [provider]  Vitamin D, Ergocalciferol, (DRISDOL) 1.25 MG (50000 UNIT) CAPS capsule Take 1 capsule (50,000 Units total) by mouth every Tuesday. Patient not taking: Reported on 04/12/2022 11/02/21   Leatha Gilding, MD  warfarin (COUMADIN) 2 MG tablet Take 2 mg by mouth See admin instructions. Take one tablet (2 mg) by mouth in the morning on Thursday and Tuesday (take 3 mg on Monday, Wednesday, Friday,Saturday and  Sunday in the morning)    [provider]  warfarin (COUMADIN) 3 MG tablet Take 3 mg by mouth See admin instructions. Take one tablet (3 mg) by mouth on Monday, Wednesday, Friday, Saturday and Sunday morning (take 2 mg on Thursday and Tuesday morning)    [provider]      Allergies    Aspirin, Ibuprofen,  Clindamycin/lincomycin, Other, Sulfa antibiotics, and Tramadol    Review of Systems   Review of Systems  Neurological:  Positive for speech difficulty and weakness.  All other systems reviewed and are negative.   Physical Exam Updated Vital Signs BP (!) 164/80   Pulse 63   Temp 97.7 F (36.5 C)   Resp 15   Ht 5\' 11"  (1.803 m)   Wt 61.8 kg   SpO2 98%   BMI 19.00 kg/m  Physical Exam Vitals and nursing note reviewed.  Constitutional:      Appearance: Normal appearance.  HENT:     Head: Normocephalic and atraumatic.     Right Ear: External ear normal.     Left Ear: External ear normal.     Nose: Nose normal.     Mouth/Throat:     Mouth: Mucous  membranes are moist.     Pharynx: Oropharynx is clear.  Eyes:     Extraocular Movements: Extraocular movements intact.     Conjunctiva/sclera: Conjunctivae normal.     Pupils: Pupils are equal, round, and reactive to light.  Cardiovascular:     Rate and Rhythm: Normal rate and regular rhythm.     Pulses: Normal pulses.     Heart sounds: Normal heart sounds.  Pulmonary:     Effort: Pulmonary effort is normal.     Breath sounds: Normal breath sounds.  Abdominal:     General: Abdomen is flat. Bowel sounds are normal.     Palpations: Abdomen is soft.  Genitourinary:    Comments: Indwelling fc Musculoskeletal:        General: Normal range of motion.     Cervical back: Normal range of motion and neck supple.  Skin:    General: Skin is warm.     Capillary Refill: Capillary refill takes less than 2 seconds.  Neurological:     General: No focal deficit present.     Mental Status: He is alert and oriented to person, place, and time.  Psychiatric:        Mood and Affect: Mood normal.        Behavior: Behavior normal.     ED Results / Procedures / Treatments   Labs (all labs ordered are listed, but only abnormal results are displayed) Labs Reviewed  COMPREHENSIVE METABOLIC PANEL - Abnormal; Notable for the following components:      Result Value   BUN 29 (*)    All other components within normal limits  CBC - Abnormal; Notable for the following components:   RBC 3.71 (*)    MCV 109.4 (*)    MCH 35.8 (*)    All other components within normal limits  URINALYSIS, ROUTINE W REFLEX MICROSCOPIC - Abnormal; Notable for the following components:   APPearance HAZY (*)    Hgb urine dipstick MODERATE (*)    Protein, ur 30 (*)    Nitrite POSITIVE (*)    Leukocytes,Ua LARGE (*)    Bacteria, UA MANY (*)    All other components within normal limits  URINE CULTURE  CULTURE, BLOOD (ROUTINE X 2)  CULTURE, BLOOD (ROUTINE X 2)  PROTIME-INR  CBG MONITORING, ED  I-STAT CG4 LACTIC ACID, ED     EKG EKG Interpretation Date/Time:  Monday April 24 2023 18:23:46 EDT Ventricular Rate:  84 PR Interval:    QRS Duration:  138 QT Interval:  422 QTC Calculation: 498 R Axis:   83  Text Interpretation: Undetermined rhythm Right bundle branch block T wave abnormality, consider  inferior ischemia Abnormal ECG When compared with ECG of 02-Aug-2021 17:27, PREVIOUS ECG IS PRESENT No significant change since last tracing Confirmed by Jacalyn Lefevre [phone]) on 04/24/2023 11:10:48 PM  Radiology CT ANGIO HEAD NECK W WO CM  Result Date: 04/24/2023 CLINICAL DATA:  Left leg weakness and expressive aphasia EXAM: CT HEAD WITHOUT CONTRAST CT ANGIOGRAPHY OF THE HEAD AND NECK TECHNIQUE: Contiguous axial images were obtained from the base of the skull through the vertex without intravenous contrast. Multidetector CT imaging of the head and neck was performed using the standard protocol during bolus administration of intravenous contrast. Multiplanar CT image reconstructions and MIPs were obtained to evaluate the vascular anatomy. Carotid stenosis measurements (when applicable) are obtained utilizing NASCET criteria, using the distal internal carotid diameter as the denominator. RADIATION DOSE REDUCTION: This exam was performed according to the departmental dose-optimization program which includes automated exposure control, adjustment of the mA and/or kV according to patient size and/or use of iterative reconstruction technique. CONTRAST:  75mL OMNIPAQUE IOHEXOL 350 MG/ML SOLN COMPARISON:  Head CT 04/24/2023 FINDINGS: CT HEAD FINDINGS Brain: There is no mass, hemorrhage or extra-axial collection. The size and configuration of the ventricles and extra-axial CSF spaces are normal. There is hypoattenuation of the white matter, most commonly indicating chronic small vessel disease. Skull: The visualized skull base, calvarium and extracranial soft tissues are normal. Sinuses/Orbits: Postsurgical changes of the left  maxillary sinus with moderate mucosal thickening. Moderate left sphenoid sinus mucosal thickening. Small amount of fluid in left mastoid. Ocular lens replacements. CTA NECK FINDINGS SKELETON: No acute abnormality or high grade bony spinal canal stenosis. OTHER NECK: Normal pharynx, larynx and major salivary glands. No cervical lymphadenopathy. Unremarkable thyroid gland. UPPER CHEST: Clustered right upper lobe pulmonary nodules measuring up to 8 mm. Left apical scarring. AORTIC ARCH: There is calcific atherosclerosis of the aortic arch. Conventional 3 vessel aortic branching pattern. RIGHT CAROTID SYSTEM: There is bulky mixed density plaque of the distal common carotid artery and proximal internal carotid artery. This causes less than 50% stenosis. No dissection or other acute abnormality. LEFT CAROTID SYSTEM: Mild calcific atherosclerosis of the common carotid artery. Mixed density plaque of the proximal ICA with less than 50% stenosis. VERTEBRAL ARTERIES: Left dominant configuration. There is no dissection, occlusion or flow-limiting stenosis to the skull base (V1-V3 segments). CTA HEAD FINDINGS POSTERIOR CIRCULATION: --Vertebral arteries: Atherosclerotic calcification of the left V4 segment without stenosis. --Inferior cerebellar arteries: Normal. --Basilar artery: Normal. --Superior cerebellar arteries: Normal. --Posterior cerebral arteries (PCA): Normal. ANTERIOR CIRCULATION: --Intracranial internal carotid arteries: Atherosclerotic calcification of the internal carotid arteries at the skull base without hemodynamically significant stenosis. --Anterior cerebral arteries (ACA): Normal. --Middle cerebral arteries (MCA): Normal. VENOUS SINUSES: As permitted by contrast timing, patent. ANATOMIC VARIANTS: None Review of the MIP images confirms the above findings. IMPRESSION: 1. No emergent large vessel occlusion or high-grade stenosis of the intracranial arteries. 2. Bulky mixed density plaque of the distal common  and proximal internal carotid arteries with less than 50% stenosis. 3. Clustered right upper lobe pulmonary nodules measuring up to 8 mm. Nonemergent chest CT recommended for complete visualization and to guide follow-up recommendations. Aortic Atherosclerosis (ICD10-I70.0). Electronically Signed   By: Deatra Robinson M.D.   On: 04/24/2023 23:29   CT Head Wo Contrast  Result Date: 04/24/2023 CLINICAL DATA:  Left leg weakness and expressive aphasia EXAM: CT HEAD WITHOUT CONTRAST CT ANGIOGRAPHY OF THE HEAD AND NECK TECHNIQUE: Contiguous axial images were obtained from the base of the skull through the  vertex without intravenous contrast. Multidetector CT imaging of the head and neck was performed using the standard protocol during bolus administration of intravenous contrast. Multiplanar CT image reconstructions and MIPs were obtained to evaluate the vascular anatomy. Carotid stenosis measurements (when applicable) are obtained utilizing NASCET criteria, using the distal internal carotid diameter as the denominator. RADIATION DOSE REDUCTION: This exam was performed according to the departmental dose-optimization program which includes automated exposure control, adjustment of the mA and/or kV according to patient size and/or use of iterative reconstruction technique. CONTRAST:  75mL OMNIPAQUE IOHEXOL 350 MG/ML SOLN COMPARISON:  Head CT 04/24/2023 FINDINGS: CT HEAD FINDINGS Brain: There is no mass, hemorrhage or extra-axial collection. The size and configuration of the ventricles and extra-axial CSF spaces are normal. There is hypoattenuation of the white matter, most commonly indicating chronic small vessel disease. Skull: The visualized skull base, calvarium and extracranial soft tissues are normal. Sinuses/Orbits: Postsurgical changes of the left maxillary sinus with moderate mucosal thickening. Moderate left sphenoid sinus mucosal thickening. Small amount of fluid in left mastoid. Ocular lens replacements. CTA  NECK FINDINGS SKELETON: No acute abnormality or high grade bony spinal canal stenosis. OTHER NECK: Normal pharynx, larynx and major salivary glands. No cervical lymphadenopathy. Unremarkable thyroid gland. UPPER CHEST: Clustered right upper lobe pulmonary nodules measuring up to 8 mm. Left apical scarring. AORTIC ARCH: There is calcific atherosclerosis of the aortic arch. Conventional 3 vessel aortic branching pattern. RIGHT CAROTID SYSTEM: There is bulky mixed density plaque of the distal common carotid artery and proximal internal carotid artery. This causes less than 50% stenosis. No dissection or other acute abnormality. LEFT CAROTID SYSTEM: Mild calcific atherosclerosis of the common carotid artery. Mixed density plaque of the proximal ICA with less than 50% stenosis. VERTEBRAL ARTERIES: Left dominant configuration. There is no dissection, occlusion or flow-limiting stenosis to the skull base (V1-V3 segments). CTA HEAD FINDINGS POSTERIOR CIRCULATION: --Vertebral arteries: Atherosclerotic calcification of the left V4 segment without stenosis. --Inferior cerebellar arteries: Normal. --Basilar artery: Normal. --Superior cerebellar arteries: Normal. --Posterior cerebral arteries (PCA): Normal. ANTERIOR CIRCULATION: --Intracranial internal carotid arteries: Atherosclerotic calcification of the internal carotid arteries at the skull base without hemodynamically significant stenosis. --Anterior cerebral arteries (ACA): Normal. --Middle cerebral arteries (MCA): Normal. VENOUS SINUSES: As permitted by contrast timing, patent. ANATOMIC VARIANTS: None Review of the MIP images confirms the above findings. IMPRESSION: 1. No emergent large vessel occlusion or high-grade stenosis of the intracranial arteries. 2. Bulky mixed density plaque of the distal common and proximal internal carotid arteries with less than 50% stenosis. 3. Clustered right upper lobe pulmonary nodules measuring up to 8 mm. Nonemergent chest CT  recommended for complete visualization and to guide follow-up recommendations. Aortic Atherosclerosis (ICD10-I70.0). Electronically Signed   By: Deatra Robinson M.D.   On: 04/24/2023 23:29   DG Chest Portable 1 View  Result Date: 04/24/2023 CLINICAL DATA:  Altered mental status EXAM: PORTABLE CHEST 1 VIEW COMPARISON:  11/03/2022 FINDINGS: Cardiac shadow is stable. Aortic calcifications are seen. The lungs are well aerated bilaterally. No focal infiltrate or effusion is seen. No bony abnormality is noted. IMPRESSION: No active disease. Electronically Signed   By: Alcide Clever M.D.   On: 04/24/2023 23:21    Procedures Procedures    Medications Ordered in ED Medications  cefTRIAXone (ROCEPHIN) 1 g in sodium chloride 0.9 % 100 mL IVPB (has no administration in time range)  sodium chloride 0.9 % bolus 500 mL (has no administration in time range)  iohexol (OMNIPAQUE) 350 MG/ML injection 75  mL (75 mLs Intravenous Contrast Given 04/24/23 2254)    ED Course/ Medical Decision Making/ A&P                                 Medical Decision Making Amount and/or Complexity of Data Reviewed Labs: ordered. Radiology: ordered.  Risk Decision regarding hospitalization.   This patient presents to the ED for concern of weakness, this involves an extensive number of treatment options, and is a complaint that carries with it a high risk of complications and morbidity.  The differential diagnosis includes infection, electrolyte abn, cva   Co morbidities that complicate the patient evaluation  urinary retention with indwelling foley, hld, htn, afib (on coumadin), mouth cancer, Snow, bladder cancer, and gerd   Additional history obtained:  Additional history obtained from epic chart review External records from outside source obtained and reviewed including son   Lab Tests:  I Ordered, and personally interpreted labs.  The pertinent results include:  ua + uti, cmp nl, cbc nl   Imaging Studies  ordered:  I ordered imaging studies including ct head/cta and cxr  I independently visualized and interpreted imaging which showed  CT head/CTA . No emergent large vessel occlusion or high-grade stenosis of the  intracranial arteries.  2. Bulky mixed density plaque of the distal common and proximal  internal carotid arteries with less than 50% stenosis.  3. Clustered right upper lobe pulmonary nodules measuring up to 8  mm. Nonemergent chest CT recommended for complete visualization and  to guide follow-up recommendations.    Aortic Atherosclerosis (ICD10-I70.0).  CXR: No active disease.  I agree with the radiologist interpretation   Cardiac Monitoring:  The patient was maintained on a cardiac monitor.  I personally viewed and interpreted the cardiac monitored which showed an underlying rhythm of: nsr   Medicines ordered and prescription drug management:  I ordered medication including ivfs/rocephin  for sx  Reevaluation of the patient after these medicines showed that the patient improved I have reviewed the patients home medicines and have made adjustments as needed   Test Considered:  ct   Critical Interventions:  Iv abx   Consultations Obtained:  I requested consultation with the hospitalist (Dr. Mikeal Hawthorne),  and discussed lab and imaging findings as well as pertinent plan - he will admit   Problem List / ED Course:  Weakness:  likely due to uti.  Urine sent from a new foley.  Pt started on rocephin.  CTA and ct head neg for cva.  MRI added on.   Reevaluation:  After the interventions noted above, I reevaluated the patient and found that they have :improved   Social Determinants of Health:  Lives at home with son   Dispostion:  After consideration of the diagnostic results and the patients response to treatment, I feel that the patent would benefit from admission.          Final Clinical Impression(s) / ED Diagnoses Final diagnoses:  Urinary  tract infection associated with indwelling urethral catheter, initial encounter (HCC)  FTT (failure to thrive) in adult    Rx / DC Orders ED Discharge Orders     None         Jacalyn Lefevre, MD 04/24/23 2357

## 2023-04-24 NOTE — ED Provider Triage Note (Signed)
Emergency Medicine Provider Triage Evaluation Note  Craig Snow. , a 89 y.o. male  was evaluated in triage.  Pt complains of aphasia.  Review of Systems  Positive:  Negative:   Physical Exam  BP (!) 168/94 (BP Location: Left Arm)   Pulse 88   Temp (!) 97.3 F (36.3 C) (Oral)   Resp 18   Ht 5\' 11"  (1.803 m)   Wt 61.8 kg   SpO2 100%   BMI 19.00 kg/m  Gen:   Awake, no distress   Resp:  Normal effort  MSK:   Moves extremities without difficulty  Other:    Medical Decision Making  Medically screening exam initiated at 6:57 PM.  Appropriate orders placed.  Craig Snow. was informed that the remainder of the evaluation will be completed by another provider, this initial triage assessment does not replace that evaluation, and the importance of remaining in the ED until their evaluation is complete.  Son concerned because patient had some difficulty completing sentences around 2PM that lasted about 1hr. Patient also with decreased appetite recently and apparently had troubles using his left leg 1 week ago that has since resolved. Also maybe had some difficulties writing recently that has resolved. Denies any infectious symptoms. Patient is hard of hearing.   GCS 15. Speech is goal oriented. No deficits appreciated to CN III-XII; symmetric eyebrow raise, no facial drooping, tongue midline. Patient has equal grip strength bilaterally with 5/5 strength against resistance in all major muscle groups bilaterally. Sensation to light touch intact. Patient moves extremities without ataxia. Patient ambulatory with steady gait.    Dorthy Cooler, [place] 04/24/23 1859

## 2023-04-24 NOTE — ED Notes (Signed)
MD Haviland at bedside, has assesed pt, reports CT will be ordered, code stroke not called per MD, family states weakness started Friday, aphasia/ slurred speech started today between 2 pm-4pm.

## 2023-04-24 NOTE — ED Triage Notes (Signed)
Pt came in with son. Endorses Left leg weakness that started Friday. States today around 2pm patient had expressive aphasia. Son reports it resolves a few hours later. Patient has not been eating and drinking as normal and a little more lethargic today. No other symptoms reported

## 2023-04-24 NOTE — H&P (Signed)
History and Physical    Patient: Craig Snow. WUJ:[id number] DOB: 1921-04-25 DOA: 04/24/2023 DOS: the patient was seen and examined on 04/24/2023 PCP: Daisy Floro, MD  Patient coming from: Home  Chief Complaint:  Chief Complaint  Patient presents with   Aphasia   HPI: Craig Snow. is a 89 y.o. male with medical history significant of atrial fibrillation, GERD, hyperlipidemia, essential hypertension, independent Foley catheter, history of bladder cancer and BPH who presents to the ER with his son but he lives with with a complaint of generalized weakness and aphasia.  Initially CVA suspected.  So far head CT without contrast and CTA were negative.  Urinalysis however shows evidence of UTI.  MRI currently pending.  Patient is being admitted for generalized weakness as well as possible UTI as a cause.  No fever no chills no nausea vomiting or diarrhea at this point.  Patient is still remains partially aphasic.  Review of Systems: As mentioned in the history of present illness. All other systems reviewed and are negative. Past Medical History:  Diagnosis Date   Acquired thrombophilia (HCC)    Acute lower UTI 07/30/2021   Arrhythmia    Atrial fibrillation (HCC)    Bladder cancer (HCC)    BPH (benign prostatic hyperplasia)    Candidiasis of penis 07/30/2021   Chronic ear infection, left    Community acquired pneumonia 07/30/2021   aspiration   GERD (gastroesophageal reflux disease)    Headache    History of radiation therapy    oral cancer   Hyperlipidemia    Hypertension    Indwelling Foley catheter present    Neoplasm of palate 2008   Poor dentition    Ruptured eardrum, left    absent eardrum   Supratherapeutic INR 07/30/2021   Thyrotoxicosis    Past Surgical History:  Procedure Laterality Date   ANTERIOR APPROACH HEMI HIP ARTHROPLASTY Left 10/23/2021   Procedure: ANTERIOR APPROACH HEMI HIP ARTHROPLASTY;  Surgeon: Samson Frederic, MD;  Location: [facility] OR;  Service:  Orthopedics;  Laterality: Left;   INCISION AND DRAINAGE OF WOUND Left 04/13/2022   Procedure: IRRIGATION AND DEBRIDEMENT SUPERFICIAL HIP;  Surgeon: Samson Frederic, MD;  Location: [facility] ORS;  Service: Orthopedics;  Laterality: Left;  120   MOUTH SURGERY     cancer   Social History:  reports that he quit smoking about 51 years ago. His smoking use included cigarettes. He has never used smokeless tobacco. He reports that he does not currently use alcohol. He reports that he does not currently use drugs.  Allergies  Allergen Reactions   Aspirin Other (See Comments)    Unknown reaction - listed on [facility] 07/30/21   Ibuprofen Anaphylaxis   Clindamycin/Lincomycin Other (See Comments)    Unknown reaction - listed on [facility] 07/30/21   Other Other (See Comments)    Unknown reaction to opioids - listed on [facility] 07/30/21 hallucinations   Sulfa Antibiotics Other (See Comments)    Unknown reaction - listed on [facility] 07/30/21   Tramadol Other (See Comments)    Hallucinations/ Currently taking for severe pain    Family History  Problem Relation Age of Onset   Cancer Father     Prior to Admission medications   Medication Sig Start Date End Date Taking? Authorizing Provider  ciprofloxacin (CIPRO) 250 MG tablet Take 250 mg by mouth 2 (two) times daily. 03/30/23  Yes [provider]  acetaminophen (TYLENOL) 650 MG CR tablet Take 650-1,300 mg by mouth every 8 (eight) hours  as needed for pain.    [provider]  atorvastatin (LIPITOR) 10 MG tablet Take 10 mg by mouth at bedtime.    [provider]  ferrous sulfate 325 (65 FE) MG tablet Take 325 mg by mouth daily with breakfast.    [provider]  furosemide (LASIX) 20 MG tablet Take 20 mg by mouth at bedtime.    [provider]  levothyroxine (SYNTHROID) 50 MCG tablet Take 50 mcg by mouth every morning.    [provider]  liothyronine (CYTOMEL) 5 MCG tablet Take 5 mcg by mouth every morning.    [provider]  melatonin 5 MG TABS Take 5 mg by mouth at bedtime.    [provider]  metoprolol tartrate (LOPRESSOR) 25 MG tablet Take 1 tablet (25 mg total) by mouth 2 (two) times daily. 08/06/21   Willeen Niece, MD  mirtazapine (REMERON) 15 MG tablet Take 15 mg by mouth at bedtime.    [provider]  pantoprazole (PROTONIX) 40 MG tablet Take 40 mg by mouth every morning.    [provider]  potassium chloride (KLOR-CON) 10 MEQ tablet Take 10 mEq by mouth daily.    [provider]  PRESCRIPTION MEDICATION Place 1 Application into the left ear 2 (two) times daily. Compound @[facility] Refrigerated capsule Ciprofloxacin/clotrimazole/Boric Acid and dexamethasone    [provider]  traMADol (ULTRAM) 50 MG tablet Take 25 mg by mouth daily as needed for severe pain.    [provider]  Vitamin D, Ergocalciferol, (DRISDOL) 1.25 MG (50000 UNIT) CAPS capsule Take 1 capsule (50,000 Units total) by mouth every Tuesday. Patient not taking: Reported on 04/12/2022 11/02/21   Leatha Gilding, MD  warfarin (COUMADIN) 2 MG tablet Take 2 mg by mouth See admin instructions. Take one tablet (2 mg) by mouth in the morning on Thursday and Tuesday (take 3 mg on Monday, Wednesday, Friday,Saturday and  Sunday in the morning)    [provider]  warfarin (COUMADIN) 3 MG tablet Take 3 mg by mouth See admin instructions. Take one tablet (3 mg) by mouth on Monday, Wednesday, Friday, Saturday and Sunday morning (take 2 mg on Thursday and Tuesday morning)    [provider]    Physical Exam: Vitals:   04/24/23 2130 04/24/23 2147 04/24/23 2300 04/24/23 2330  BP:  (!) 155/86  (!) 164/80  Pulse: 85 (!) 25  63  Resp: (!) 22 13  15   Temp:   97.7 F (36.5 C) 97.7 F (36.5 C)  TempSrc:   Oral   SpO2: 97% 94%  98%  Weight:      Height:       Constitutional: Chronically ill looking and weak NAD, calm, comfortable Eyes: PERRL, lids and  conjunctivae normal ENMT: Mucous membranes are moist. Posterior pharynx clear of any exudate or lesions.Normal dentition.  Neck: normal, supple, no masses, no thyromegaly Respiratory: clear to auscultation bilaterally, no wheezing, no crackles. Normal respiratory effort. No accessory muscle use.  Cardiovascular: Regular rate and rhythm, no murmurs / rubs / gallops. No extremity edema. 2+ pedal pulses. No carotid bruits.  Abdomen: no tenderness, no masses palpated. No hepatosplenomegaly. Bowel sounds positive.  Musculoskeletal: Good range of motion, no joint swelling or tenderness, Skin: no rashes, lesions, ulcers. No induration Neurologic: Dysarthria, mildly aphasic, CN 2-12 grossly intact. Sensation intact, DTR normal. Strength 5/5 in all 4.  Psychiatric: Confused, weak,  Data Reviewed:  Blood pressure 150/86, urinalysis showed hazy urine leukocytes  large nitrite positive many bacteria head CT without contrast showed chronic small vessel disease otherwise no CVA CT angiogram showed no acute findings bulky mixed density plaque of the Diastyl, and proximal internal carotid arteries, cluster right upper lobe pulmonary nodules  Assessment and Plan:  #1 generalized weakness and aphasia: Possibly due to UTI.  Still ruling out CVA.  MRI pending.  Continue with IV antibiotics.  Await culture sensitivity results.  Patient on warfarin.  Continue.  Aspirin and statin already on board.  PT and OT consult  #2 atrial fibrillation: Rate is controlled.  Continue warfarin  #3 hypothyroidism: Continue with levothyroxine.  Also liothyronine  #4 essential hypertension: Continue metoprolol.  #5 hyperlipidemia: On atorvastatin but only 10 mg.  Will probably increase to 40 mg if CVA confirmed.  #6 UTI: Initiate IV Rocephin.  #7 history of bladder cancer: Stable.    Advance Care Planning:   Code Status: Do not attempt resuscitation (DNR) PRE-ARREST INTERVENTIONS DESIRED   Consults: None  Family  Communication: Son at bedside  Severity of Illness: The appropriate patient status for this patient is INPATIENT. Inpatient status is judged to be reasonable and necessary in order to provide the required intensity of service to ensure the patient's safety. The patient's presenting symptoms, physical exam findings, and initial radiographic and laboratory data in the context of their chronic comorbidities is felt to place them at high risk for further clinical deterioration. Furthermore, it is not anticipated that the patient will be medically stable for discharge from the hospital within 2 midnights of admission.   * I certify that at the point of admission it is my clinical judgment that the patient will require inpatient hospital care spanning beyond 2 midnights from the point of admission due to high intensity of service, high risk for further deterioration and high frequency of surveillance required.*  AuthorLonia Blood, MD 04/24/2023 11:56 PM  For on call review [email or web address].

## 2023-04-25 ENCOUNTER — Inpatient Hospital Stay (HOSPITAL_COMMUNITY): Payer: Medicare Other

## 2023-04-25 ENCOUNTER — Other Ambulatory Visit: Payer: Self-pay

## 2023-04-25 DIAGNOSIS — R531 Weakness: Secondary | ICD-10-CM | POA: Diagnosis not present

## 2023-04-25 LAB — CBC
HCT: 40.2 % (ref 39.0–52.0)
Hemoglobin: 13.2 g/dL (ref 13.0–17.0)
MCH: 36 pg — ABNORMAL HIGH (ref 26.0–34.0)
MCHC: 32.8 g/dL (ref 30.0–36.0)
MCV: 109.5 fL — ABNORMAL HIGH (ref 80.0–100.0)
Platelets: 149 10*3/uL — ABNORMAL LOW (ref 150–400)
RBC: 3.67 MIL/uL — ABNORMAL LOW (ref 4.22–5.81)
RDW: 13.2 % (ref 11.5–15.5)
WBC: 8.7 10*3/uL (ref 4.0–10.5)
nRBC: 0 % (ref 0.0–0.2)

## 2023-04-25 LAB — COMPREHENSIVE METABOLIC PANEL
ALT: 19 U/L (ref 0–44)
AST: 26 U/L (ref 15–41)
Albumin: 3.8 g/dL (ref 3.5–5.0)
Alkaline Phosphatase: 88 U/L (ref 38–126)
Anion gap: 11 (ref 5–15)
BUN: 25 mg/dL — ABNORMAL HIGH (ref 8–23)
CO2: 26 mmol/L (ref 22–32)
Calcium: 10.1 mg/dL (ref 8.9–10.3)
Chloride: 100 mmol/L (ref 98–111)
Creatinine, Ser: 0.91 mg/dL (ref 0.61–1.24)
GFR, Estimated: >60 mL/min (ref >60)
Glucose, Bld: 93 mg/dL (ref 70–99)
Potassium: 3.9 mmol/L (ref 3.5–5.1)
Sodium: 137 mmol/L (ref 135–145)
Total Bilirubin: 1.1 mg/dL (ref 0.3–1.2)
Total Protein: 7.1 g/dL (ref 6.5–8.1)

## 2023-04-25 LAB — PROTIME-INR
INR: 1.1 (ref 0.8–1.2)
Prothrombin Time: 14.9 s (ref 11.4–15.2)

## 2023-04-25 LAB — I-STAT CG4 LACTIC ACID, ED: Lactic Acid, Venous: 1.2 mmol/L (ref 0.5–1.9)

## 2023-04-25 MED ORDER — LEVOTHYROXINE SODIUM 50 MCG PO TABS
50.0000 ug | ORAL_TABLET | Freq: Every day | ORAL | Status: DC
  Administered 2023-04-25 – 2023-04-28 (×3): 50 ug via ORAL
  Filled 2023-04-25 (×4): qty 1

## 2023-04-25 MED ORDER — ENSURE ENLIVE PO LIQD
237.0000 mL | Freq: Two times a day (BID) | ORAL | Status: DC
  Administered 2023-04-27 – 2023-04-28 (×3): 237 mL via ORAL

## 2023-04-25 MED ORDER — ONDANSETRON HCL 4 MG PO TABS: 4.0000 mg | ORAL_TABLET | Freq: Four times a day (QID) | ORAL | Status: DC | PRN

## 2023-04-25 MED ORDER — METOPROLOL TARTRATE 25 MG PO TABS
25.0000 mg | ORAL_TABLET | Freq: Two times a day (BID) | ORAL | Status: DC
  Administered 2023-04-25 (×2): 25 mg via ORAL
  Filled 2023-04-25 (×3): qty 1

## 2023-04-25 MED ORDER — FERROUS SULFATE 325 (65 FE) MG PO TABS
325.0000 mg | ORAL_TABLET | Freq: Every day | ORAL | Status: DC
Start: 2023-04-25 — End: 2023-04-25

## 2023-04-25 MED ORDER — LIOTHYRONINE SODIUM 5 MCG PO TABS
5.0000 ug | ORAL_TABLET | Freq: Every morning | ORAL | Status: DC
  Administered 2023-04-25 – 2023-04-28 (×2): 5 ug via ORAL
  Filled 2023-04-25 (×4): qty 1

## 2023-04-25 MED ORDER — ATORVASTATIN CALCIUM 10 MG PO TABS
10.0000 mg | ORAL_TABLET | Freq: Every day | ORAL | Status: DC
  Administered 2023-04-25 – 2023-04-27 (×2): 10 mg via ORAL
  Filled 2023-04-25 (×2): qty 1

## 2023-04-25 MED ORDER — MELATONIN 5 MG PO TABS
10.0000 mg | ORAL_TABLET | Freq: Every day | ORAL | Status: DC
  Administered 2023-04-25 – 2023-04-27 (×2): 10 mg via ORAL
  Filled 2023-04-25 (×2): qty 2

## 2023-04-25 MED ORDER — ACETAMINOPHEN 650 MG RE SUPP: 650.0000 mg | Freq: Four times a day (QID) | RECTAL | Status: DC | PRN

## 2023-04-25 MED ORDER — QUETIAPINE FUMARATE 25 MG PO TABS
25.0000 mg | ORAL_TABLET | ORAL | Status: DC
  Administered 2023-04-25 – 2023-04-27 (×2): 25 mg via ORAL
  Filled 2023-04-25 (×2): qty 1

## 2023-04-25 MED ORDER — SODIUM CHLORIDE 0.9% FLUSH
3.0000 mL | Freq: Two times a day (BID) | INTRAVENOUS | Status: DC
  Administered 2023-04-25 – 2023-04-28 (×5): 3 mL via INTRAVENOUS

## 2023-04-25 MED ORDER — ENOXAPARIN SODIUM 40 MG/0.4ML IJ SOSY
40.0000 mg | PREFILLED_SYRINGE | INTRAMUSCULAR | Status: DC
  Administered 2023-04-25 – 2023-04-28 (×4): 40 mg via SUBCUTANEOUS
  Filled 2023-04-25 (×4): qty 0.4

## 2023-04-25 MED ORDER — ONDANSETRON HCL 4 MG/2ML IJ SOLN: 4.0000 mg | Freq: Four times a day (QID) | INTRAMUSCULAR | Status: DC | PRN

## 2023-04-25 MED ORDER — SODIUM CHLORIDE 0.9 % IV SOLN
1.0000 g | INTRAVENOUS | Status: DC
  Administered 2023-04-25 – 2023-04-26 (×2): 1 g via INTRAVENOUS
  Filled 2023-04-25 (×2): qty 10

## 2023-04-25 MED ORDER — DEXTROSE-SODIUM CHLORIDE 5-0.9 % IV SOLN: INTRAVENOUS | Status: DC

## 2023-04-25 MED ORDER — MIRTAZAPINE 15 MG PO TABS
15.0000 mg | ORAL_TABLET | Freq: Every day | ORAL | Status: DC
  Administered 2023-04-25 – 2023-04-27 (×2): 15 mg via ORAL
  Filled 2023-04-25 (×2): qty 1

## 2023-04-25 MED ORDER — CHLORHEXIDINE GLUCONATE CLOTH 2 % EX PADS
6.0000 | MEDICATED_PAD | Freq: Every day | CUTANEOUS | Status: DC
Start: 2023-04-25 — End: 2023-04-28
  Administered 2023-04-26 – 2023-04-28 (×3): 6 via TOPICAL

## 2023-04-25 MED ORDER — POTASSIUM CHLORIDE CRYS ER 10 MEQ PO TBCR
10.0000 meq | EXTENDED_RELEASE_TABLET | Freq: Every day | ORAL | Status: DC
  Administered 2023-04-25 – 2023-04-28 (×2): 10 meq via ORAL
  Filled 2023-04-25 (×5): qty 1

## 2023-04-25 MED ORDER — FUROSEMIDE 20 MG PO TABS
20.0000 mg | ORAL_TABLET | Freq: Every day | ORAL | Status: DC
  Administered 2023-04-25 – 2023-04-27 (×2): 20 mg via ORAL
  Filled 2023-04-25 (×2): qty 1

## 2023-04-25 MED ORDER — ACETAMINOPHEN 325 MG PO TABS: 650.0000 mg | ORAL_TABLET | Freq: Four times a day (QID) | ORAL | Status: DC | PRN

## 2023-04-25 NOTE — Evaluation (Signed)
Occupational Therapy Evaluation Patient Details Name: Craig Snow. MRN: [id number] DOB: August 07, 1920 Today's Date: 04/25/2023   History of Present Illness Craig Snow. is a 89 yr old male who presents with weakness and aphasia. MRI of brain: No acute intracranial abnormality. Pt admitted with  possible UTI. PMH: urinary retention with indwelling foley, HTN, afib, mouth cancer, bladder cancer, and GERD.   Clinical Impression   The pt is currently presenting with the below listed deficits, which compromise his ADL performance (see OT problem list). Pt noted to be with decreased safety awareness, poor insight, and intermittent impulsivity, requiring constant education, redirection, cues for safety.  His need for assistance, fluctuated significantly during the session, due to his confusion and hearing impairment. Pt's son reported pt with possible hospital delirium as well as lack of sleep last night, contributing to his worsened cognition. Pt's son desires for the pt to return pt home with continued assistance, home health aide services, and home therapy.  OT to follow the pt for therapy services in the acute care setting to maximize his safety and independence with self-care tasks.     If plan is discharge home, recommend the following: A little help with walking and/or transfers;Assistance with cooking/housework;Direct supervision/assist for medications management;Assist for transportation;Supervision due to cognitive status;A lot of help with bathing/dressing/bathroom    Functional Status Assessment  Patient has had a recent decline in their functional status and demonstrates the ability to make significant improvements in function in a reasonable and predictable amount of time.  Equipment Recommendations  None recommended by OT    Recommendations for Other Services       Precautions / Restrictions Precautions Precautions: Fall Precaution Comments: confusion Restrictions Weight  Bearing Restrictions: No      Mobility Bed Mobility Overal bed mobility: Needs Assistance Bed Mobility: Supine to Sit; CGA           General bed mobility comments: complicated by [facility] and cognition, increased time and effort with CGA to come to sitting EOB    Transfers Overall transfer level: Needs assistance Equipment used: Rolling walker (2 wheels) Transfers: Sit to/from Stand Sit to Stand: Mod assist, Contact guard assist           General transfer comment: complicated by [facility] and cognition, initial power to stand pt needing mod A, slow to extend bilateral knees and upright trunk, CGA for 2nd and 3rd power to stand, pt scooting to EOB and using BUE to assist in powering up appearing to understand activity better      Balance     Sitting balance-Leahy Scale: Fair       Standing balance-Leahy Scale: Poor             ADL either performed or assessed with clinical judgement   ADL Overall ADL's : Needs assistance/impaired Eating/Feeding: Supervision/ safety;Sitting;Minimal assistance;Cueing for safety;Cueing for sequencing Eating/Feeding Details (indicate cue type and reason): based on clinical judgement Grooming: Moderate assistance;Sitting;Cueing for sequencing;Cueing for safety Grooming Details (indicate cue type and reason): based on clinical judgement, with pt currently limited by increased confusion         Upper Body Dressing : Moderate assistance;Sitting;Cueing for sequencing;Cueing for safety Upper Body Dressing Details (indicate cue type and reason): based on clinical judgement, with pt currently limited by increased confusion Lower Body Dressing: Maximal assistance;Sit to/from stand;Cueing for safety;Cueing for sequencing;Moderate assistance Lower Body Dressing Details (indicate cue type and reason): based on clinical judgement, with pt currently limited by increased confusion  Toileting- Clothing Manipulation and Hygiene: Cueing for  sequencing;Cueing for safety;Sit to/from stand;Moderate assistance;Maximal assistance Toileting - Clothing Manipulation Details (indicate cue type and reason): based on clinical judgement, with pt currently limited by increased confusion                          Pertinent Vitals/Pain Pain Assessment Pain Assessment: Faces Pain Score: 0-No pain     Extremity/Trunk Assessment Upper Extremity Assessment Upper Extremity Assessment: AROM WFL based on informal observation of functional abilities. Functional grip strength bilaterally and appeared grossly symmetrical    Lower Extremity Assessment Lower Extremity Assessment: Overall WFL for tasks assessed;RLE deficits/detail;LLE deficits/detail RLE Deficits / Details: unable to MMT due to cognition, AROM WFL, strength grossly 4/5, BLE symmetrical  LLE Deficits / Details: unable to MMT due to cognition, AROM WFL, strength grossly 4/5, BLE symmetrical       Communication Communication Communication: Difficulty communicating thoughts/reduced clarity of speech;Difficulty following commands/understanding Following commands: Follows one step commands inconsistently Cueing Techniques: Verbal cues;Gestural cues;Tactile cues;Visual cues   Cognition Arousal: Alert Behavior During Therapy: Restless Overall Cognitive Status: Impaired/Different from baseline          General Comments: Pt's son present, reports pt normally conversational, oriented to self, son states he thinks the pt is having hospital delirium, as he didn't sleep well; pt seeing people in room and asking that they leave. Pt with poor safety awareness, complicated by [facility] needing written communication and max verbal cues/redirection. Son present in room also helping to educate and redirect pt as needed.                Home Living Family/patient expects to be discharged to:: Private residence Living Arrangements: Children (son and daughter-in-law) Available Help at  Discharge: Family;Personal care attendant Type of Home: House Home Access: Level entry     Home Layout: Two level Alternate Level Stairs-Number of Steps: flight with stair lift             Home Equipment: Rolling Walker (2 wheels)   Additional Comments: Pt lives in converted basement, level entry sidewalk entry, stairlifts to go to higher level for showers and rest of home. Son reports pt has PCA 8-10 hours/day for 5days/week.      Prior Functioning/Environment Prior Level of Function : Needs assist             Mobility Comments: Pt using RW for ambulation in the home; required consistent supervision ADLs Comments: Pt was independent with feeding, otherwise required supervision to min A for ADLs        OT Problem List: Decreased strength;Impaired balance (sitting and/or standing);Decreased cognition;Decreased safety awareness;Decreased knowledge of use of DME or AE;Decreased knowledge of precautions      OT Treatment/Interventions: Self-care/ADL training;Therapeutic exercise;Energy conservation;DME and/or AE instruction;Therapeutic activities;Cognitive remediation/compensation;Balance training;Patient/family education    OT Goals(Current goals can be found in the care plan section) Acute Rehab OT Goals OT Goal Formulation: Patient unable to participate in goal setting Time For Goal Achievement: 05/09/23 Potential to Achieve Goals: Good ADL Goals Pt Will Perform Eating: with set-up;sitting Pt Will Perform Grooming: with set-up;with supervision;sitting Pt Will Perform Upper Body Dressing: with supervision;sitting;with set-up Pt Will Transfer to Toilet: with contact guard assist;ambulating  OT Frequency: Min 1X/week    Co-evaluation PT/OT/SLP Co-Evaluation/Treatment: Yes Reason for Co-Treatment: Necessary to address cognition/behavior during functional activity;For patient/therapist safety;To address functional/ADL transfers PT goals addressed during session:  Mobility/safety with mobility;Balance;Proper use of DME OT  goals addressed during session: ADL's and self-care      AM-PAC OT "6 Clicks" Daily Activity     Outcome Measure Help from another person eating meals?: A Little Help from another person taking care of personal grooming?: A Lot Help from another person toileting, which includes using toliet, bedpan, or urinal?: A Lot Help from another person bathing (including washing, rinsing, drying)?: A Lot Help from another person to put on and taking off regular upper body clothing?: A Lot Help from another person to put on and taking off regular lower body clothing?: A Lot 6 Click Score: 13   End of Session Equipment Utilized During Treatment: Gait belt;Rolling walker (2 wheels) Nurse Communication: Mobility status  Activity Tolerance: Other (comment) (Fair tolerance) Patient left: in chair;with call bell/phone within reach;with chair alarm set;with family/visitor present  OT Visit Diagnosis: Unsteadiness on feet (R26.81);Muscle weakness (generalized) (M62.81);Other symptoms and signs involving cognitive function                Time: 1025-1108 OT Time Calculation (min): 43 min Charges:  OT General Charges $OT Visit: 1 Visit OT Evaluation $OT Eval Moderate Complexity: 1 Mod    Jerremy Maione L Vashawn Ekstein, OTR/L 04/25/2023, 12:53 PM

## 2023-04-25 NOTE — ED Notes (Signed)
ED tech brought pt up, Floor RN messaged twice to inform pt coming up, ED charge made aware.

## 2023-04-25 NOTE — Evaluation (Addendum)
Physical Therapy Evaluation Patient Details Name: Craig Snow. MRN: [id number] DOB: Feb 13, 1921 Today's Date: 04/25/2023  History of Present Illness  Craig Gotz. is a 89 y.o. male who presents with L leg weakness and aphasia. CTH and brain MRI: No acute intracranial abnormality. Pt admitted with UTI. PMH: urinary retention with indwelling foley, HTN, afib, mouth cancer, bladder cancer, and GERD.  Clinical Impression  Pt admitted with above diagnosis. Pt from home with son, daughter in law, and PCA providing supv and assistance as needed with ambulation using RW and self care tasks. Pt sleeps in converted basement with level entry, using stair lifts to access rest of the house for showering and socialization. Pt current functional mobility significantly fluctuates due to cognition, varying from CGA to mod A +2 for safety to prevent falls. Pt needing constant education, redirection, cues to improve safety and prevent falls. Pt's son reports possible hospital delirium as well as lack of sleep last night, pt reports seeing people in the room who aren't there. Son's goal is to return pt home with continued assistance; recommend resume OPPT vs HHPT pending hospital progression. Pt currently with functional limitations due to the deficits listed below (see PT Problem List). Pt will benefit from acute skilled PT to increase their independence and safety with mobility to allow discharge.           If plan is discharge home, recommend the following: A little help with walking and/or transfers;A little help with bathing/dressing/bathroom;Assistance with cooking/housework;Supervision due to cognitive status   Can travel by private vehicle        Equipment Recommendations None recommended by PT  Recommendations for Other Services       Functional Status Assessment Patient has had a recent decline in their functional status and demonstrates the ability to make significant improvements in function in  a reasonable and predictable amount of time.     Precautions / Restrictions Precautions Precautions: Fall Precaution Comments: confusion Restrictions Weight Bearing Restrictions: No      Mobility  Bed Mobility Overal bed mobility: Needs Assistance Bed Mobility: Supine to Sit     Supine to sit: Contact guard     General bed mobility comments: complicated by [facility] and cognition, increased time and effort with CGA to come to sitting EOB    Transfers Overall transfer level: Needs assistance Equipment used: Rolling walker (2 wheels) Transfers: Sit to/from Stand Sit to Stand: Mod assist, Contact guard assist           General transfer comment: complicated by [facility] and cognition, initial power to stand pt needing mod A, slow to extend bil knees and upright trunk, CGA for 2nd and 3rd power to stand, pt scooting to EOB and using BUE to assist in powering up appearing to understand activity better    Ambulation/Gait Ambulation/Gait assistance: Mod assist, Contact guard assist Gait Distance (Feet): 60 Feet Assistive device: Rolling walker (2 wheels) Gait Pattern/deviations: Step-to pattern, Step-through pattern, Decreased stride length Gait velocity: decreased     General Gait Details: Pt with poor safety awareness, initially attempting to ambulate quickly without foley catheter or gait belt in position, LOB needing mod A to recover. On 2nd ambulation attempt, pt appears to comprehend ambulation task, CGA with step through gait pattern, equal bil step length and foot clearance, maitnains crouched/flexed posture, no overt LOB. Pt easily distracted in hallway, needing safety awareness cues to reduce risk for falls.  Stairs  Wheelchair Mobility     Tilt Bed    Modified Rankin (Stroke Patients Only)       Balance Overall balance assessment: Needs assistance Sitting-balance support: Feet supported Sitting balance-Leahy Scale: Fair     Standing balance  support: Reliant on assistive device for balance, During functional activity, Bilateral upper extremity supported Standing balance-Leahy Scale: Poor                               Pertinent Vitals/Pain Pain Assessment Pain Assessment: Faces Faces Pain Scale: No hurt    Home Living Family/patient expects to be discharged to:: Private residence Living Arrangements: Children Available Help at Discharge: Family;Available 24 hours/day;Personal care attendant Type of Home: House Home Access: Level entry     Alternate Level Stairs-Number of Steps: flight with stair lift Home Layout: Multi-level Home Equipment: Rolling Walker (2 wheels) Additional Comments: Pt lives in converted basement, level entry sidewalk entry, stairlifts to go to higher level for showers and rest of home. Son reports PCA 8-10 hours/day for 5days/week.    Prior Function Prior Level of Function : Needs assist             Mobility Comments: Pt using RW in the home, supv 24/7 ADLs Comments: Pt needing supv to min A with self care     Extremity/Trunk Assessment   Upper Extremity Assessment Upper Extremity Assessment: Defer to OT evaluation    Lower Extremity Assessment Lower Extremity Assessment: Overall WFL for tasks assessed;RLE deficits/detail;LLE deficits/detail RLE Deficits / Details: unable to MMT due to cognition, AROM WFL, strength grossly 4/5, BLE symmetrical RLE Coordination: WNL LLE Deficits / Details: unable to MMT due to cognition, AROM WFL, strength grossly 4/5, BLE symmetrical LLE Coordination: WNL    Cervical / Trunk Assessment Cervical / Trunk Assessment: Kyphotic  Communication   Communication Communication: Difficulty communicating thoughts/reduced clarity of speech;Difficulty following commands/understanding Following commands: Follows one step commands inconsistently Cueing Techniques: Verbal cues;Gestural cues;Tactile cues;Visual cues  Cognition Arousal: Alert Behavior  During Therapy: Restless Overall Cognitive Status: Impaired/Different from baseline                                 General Comments: Pt's son present, reports pt normally conversational, oriented to self, son states he thinks the pt is having hospital delirium, didn't sleep well, pt seeing people in room and asking that they leave. Pt with poor safety awareness, complicated by [facility] needing written communication and max verbal cues/redirection. Son present in room also helping to educate and redirect pt as needed.        General Comments      Exercises     Assessment/Plan    PT Assessment Patient needs continued PT services  PT Problem List Decreased strength;Decreased activity tolerance;Decreased balance;Decreased mobility;Decreased cognition;Decreased knowledge of use of DME;Decreased safety awareness       PT Treatment Interventions DME instruction;Gait training;Functional mobility training;Therapeutic activities;Therapeutic exercise;Balance training;Neuromuscular re-education;Cognitive remediation;Patient/family education    PT Goals (Current goals can be found in the Care Plan section)  Acute Rehab PT Goals Patient Stated Goal: return home with family support PT Goal Formulation: With patient/family Time For Goal Achievement: 05/09/23 Potential to Achieve Goals: Good    Frequency Min 1X/week     Co-evaluation PT/OT/SLP Co-Evaluation/Treatment: Yes Reason for Co-Treatment: Necessary to address cognition/behavior during functional activity;For patient/therapist safety;To address functional/ADL transfers PT goals addressed  during session: Mobility/safety with mobility;Balance;Proper use of DME         AM-PAC PT "6 Clicks" Mobility  Outcome Measure Help needed turning from your back to your side while in a flat bed without using bedrails?: A Little Help needed moving from lying on your back to sitting on the side of a flat bed without using bedrails?: A  Little Help needed moving to and from a bed to a chair (including a wheelchair)?: A Little Help needed standing up from a chair using your arms (e.g., wheelchair or bedside chair)?: A Little Help needed to walk in hospital room?: A Little Help needed climbing 3-5 steps with a railing? : A Lot 6 Click Score: 17    End of Session Equipment Utilized During Treatment: Gait belt Activity Tolerance: Patient tolerated treatment well Patient left: in chair;with call bell/phone within reach;with chair alarm set;with family/visitor present Nurse Communication: Mobility status PT Visit Diagnosis: Difficulty in walking, not elsewhere classified (R26.2);Unsteadiness on feet (R26.81)    Time: 2536-6440 PT Time Calculation (min) (ACUTE ONLY): 43 min   Charges:   PT Evaluation $PT Eval Moderate Complexity: 1 Mod PT Treatments $Gait Training: 8-22 mins PT General Charges $$ ACUTE PT VISIT: 1 Visit         Tori Dilia Alemany PT, DPT 04/25/23, 11:35 AM

## 2023-04-25 NOTE — ED Notes (Signed)
.ED TO INPATIENT HANDOFF REPORT  Name/Age/Gender Craig Snow. 89 y.o. male  Code Status    Code Status Orders  (From admission, onward)           Start     Ordered   04/24/23 2355  Do not attempt resuscitation (DNR) Pre-Arrest Interventions Desired  Continuous       Question Answer Comment  If pulseless and not breathing No CPR or chest compressions.   In Pre-Arrest Conditions (Patient Has Pulse and Is Breathing) May intubate, use advanced airway interventions and cardioversion/ACLS medications if appropriate or indicated. May transfer to ICU.   Consent: Discussion documented in EHR or advanced directives reviewed      04/24/23 2356           Code Status History     Date Active Date Inactive Code Status Order ID Comments User Context   04/13/2022 1355 04/18/2022 2347 DNR [id number]  Samson Frederic, MD Inpatient   10/23/2021 0014 10/27/2021 2040 DNR [id number]  Synetta Fail, MD ED   10/22/2021 2308 10/23/2021 0014 Full Code [id number]  Synetta Fail, MD ED   07/29/2021 2155 08/07/2021 0003 DNR [id number]  Anselm Jungling, DO ED       Home/SNF/Other Home  Chief Complaint General weakness [R53.1]  Level of Care/Admitting Diagnosis ED Disposition     ED Disposition  Admit   Condition  --   Comment  Hospital Area: [facility] [[id number]]  Level of Care: Telemetry [5]  Admit to tele based on following criteria: Other see comments  Comments: possible CVA  May admit patient to [facility] or [facility] if equivalent level of care is available:: Yes  Covid Evaluation: Asymptomatic - no recent exposure (last 10 days) testing not required  Diagnosis: General weakness [301173]  Admitting Physician: Rometta Emery [[id number]]  Attending Physician: Rometta Emery [[id number]]  Certification:: I certify this patient will need inpatient services for at least 2 midnights  Expected Medical Readiness: 04/27/2023          Medical History Past Medical  History:  Diagnosis Date   Acquired thrombophilia (HCC)    Acute lower UTI 07/30/2021   Arrhythmia    Atrial fibrillation (HCC)    Bladder cancer (HCC)    BPH (benign prostatic hyperplasia)    Candidiasis of penis 07/30/2021   Chronic ear infection, left    Community acquired pneumonia 07/30/2021   aspiration   GERD (gastroesophageal reflux disease)    Headache    History of radiation therapy    oral cancer   Hyperlipidemia    Hypertension    Indwelling Foley catheter present    Neoplasm of palate 2008   Poor dentition    Ruptured eardrum, left    absent eardrum   Supratherapeutic INR 07/30/2021   Thyrotoxicosis     Allergies Allergies  Allergen Reactions   Aspirin Other (See Comments)    Unknown reaction - listed on [facility] 07/30/21   Ibuprofen Anaphylaxis   Clindamycin/Lincomycin Other (See Comments)    Unknown reaction - listed on [facility] 07/30/21   Other Other (See Comments)    Unknown reaction to opioids - listed on [facility] 07/30/21 hallucinations   Sulfa Antibiotics Other (See Comments)    Unknown reaction - listed on [facility] 07/30/21   Tramadol Other (See Comments)    Hallucinations/ Currently taking for severe pain    IV Location/Drains/Wounds Patient Lines/Drains/Airways Status     Active Line/Drains/Airways     Name  Placement date Placement time Site Days   Peripheral IV 04/24/23 20 G Anterior;Right Forearm 04/24/23  2126  Forearm  1   Peripheral IV 04/25/23 20 G 1" Anterior;Distal;Left;Upper Arm 04/25/23  0001  Arm  less than 1   Negative Pressure Wound Therapy Hip Left 04/13/22  1149  --  377   Urethral Catheter ARIEL P. RN Latex 16 Fr. 04/24/23  2132  Latex  1   Incision (Closed) 04/13/22 Hip Left 04/13/22  1238  -- 377            Labs/Imaging Results for orders placed or performed during the hospital encounter of 04/24/23 (from the past 48 hour(s))  CBG monitoring, ED     Status: None   Collection Time: 04/24/23  8:23 PM  Result Value Ref Range    Glucose-Capillary 97 70 - 99 mg/dL    Comment: Glucose reference range applies only to samples taken after fasting for at least 8 hours.  Comprehensive metabolic panel     Status: Abnormal   Collection Time: 04/24/23  8:25 PM  Result Value Ref Range   Sodium 136 135 - 145 mmol/L   Potassium 4.2 3.5 - 5.1 mmol/L   Chloride 99 98 - 111 mmol/L   CO2 26 22 - 32 mmol/L   Glucose, Bld 94 70 - 99 mg/dL    Comment: Glucose reference range applies only to samples taken after fasting for at least 8 hours.   BUN 29 (H) 8 - 23 mg/dL   Creatinine, Ser 3.66 0.61 - 1.24 mg/dL   Calcium 44.0 8.9 - 34.7 mg/dL   Total Protein 7.5 6.5 - 8.1 g/dL   Albumin 4.2 3.5 - 5.0 g/dL   AST 30 15 - 41 U/L   ALT 21 0 - 44 U/L   Alkaline Phosphatase 97 38 - 126 U/L   Total Bilirubin 1.0 0.3 - 1.2 mg/dL   GFR, Estimated >42 >59 mL/min    Comment: (NOTE) Calculated using the CKD-EPI Creatinine Equation (2021)    Anion gap 11 5 - 15    Comment: Performed at [facility], [address] [place], [place] [address]  CBC     Status: Abnormal   Collection Time: 04/24/23  8:25 PM  Result Value Ref Range   WBC 7.9 4.0 - 10.5 K/uL   RBC 3.71 (L) 4.22 - 5.81 MIL/uL   Hemoglobin 13.3 13.0 - 17.0 g/dL   HCT 56.4 33.2 - 95.1 %   MCV 109.4 (H) 80.0 - 100.0 fL   MCH 35.8 (H) 26.0 - 34.0 pg   MCHC 32.8 30.0 - 36.0 g/dL   RDW 88.4 16.6 - 06.3 %   Platelets 155 150 - 400 K/uL   nRBC 0.0 0.0 - 0.2 %    Comment: Performed at [facility], [address], [place], [place] [address]  Urinalysis, Routine w reflex microscopic -Urine, Catheterized; Indwelling urinary catheter     Status: Abnormal   Collection Time: 04/24/23 10:46 PM  Result Value Ref Range   Color, Urine YELLOW YELLOW   APPearance HAZY (A) CLEAR   Specific Gravity, Urine 1.014 1.005 - 1.030   pH 6.0 5.0 - 8.0   Glucose, UA NEGATIVE NEGATIVE mg/dL   Hgb urine dipstick MODERATE (A) NEGATIVE   Bilirubin Urine NEGATIVE  NEGATIVE   Ketones, ur NEGATIVE NEGATIVE mg/dL   Protein, ur 30 (A) NEGATIVE mg/dL   Nitrite POSITIVE (A) NEGATIVE   Leukocytes,Ua LARGE (A) NEGATIVE  RBC / HPF 11-20 0 - 5 RBC/hpf   WBC, UA >50 0 - 5 WBC/hpf   Bacteria, UA MANY (A) NONE SEEN   Squamous Epithelial / HPF 0-5 0 - 5 /HPF   WBC Clumps PRESENT    Mucus PRESENT    Budding Yeast PRESENT     Comment: Performed at [facility], [address], [place], [place] [address]  Protime-INR     Status: None   Collection Time: 04/25/23 12:04 AM  Result Value Ref Range   Prothrombin Time 14.9 11.4 - 15.2 seconds   INR 1.1 0.8 - 1.2    Comment: (NOTE) INR goal varies based on device and disease states. Performed at [facility], [address], [place], [place] [address]   I-Stat Lactic Acid     Status: None   Collection Time: 04/25/23 12:18 AM  Result Value Ref Range   Lactic Acid, Venous 1.2 0.5 - 1.9 mmol/L   CT ANGIO HEAD NECK W WO CM  Result Date: 04/24/2023 CLINICAL DATA:  Left leg weakness and expressive aphasia EXAM: CT HEAD WITHOUT CONTRAST CT ANGIOGRAPHY OF THE HEAD AND NECK TECHNIQUE: Contiguous axial images were obtained from the base of the skull through the vertex without intravenous contrast. Multidetector CT imaging of the head and neck was performed using the standard protocol during bolus administration of intravenous contrast. Multiplanar CT image reconstructions and MIPs were obtained to evaluate the vascular anatomy. Carotid stenosis measurements (when applicable) are obtained utilizing NASCET criteria, using the distal internal carotid diameter as the denominator. RADIATION DOSE REDUCTION: This exam was performed according to the departmental dose-optimization program which includes automated exposure control, adjustment of the mA and/or kV according to patient size and/or use of iterative reconstruction technique. CONTRAST:  75mL OMNIPAQUE IOHEXOL 350 MG/ML SOLN COMPARISON:   Head CT 04/24/2023 FINDINGS: CT HEAD FINDINGS Brain: There is no mass, hemorrhage or extra-axial collection. The size and configuration of the ventricles and extra-axial CSF spaces are normal. There is hypoattenuation of the white matter, most commonly indicating chronic small vessel disease. Skull: The visualized skull base, calvarium and extracranial soft tissues are normal. Sinuses/Orbits: Postsurgical changes of the left maxillary sinus with moderate mucosal thickening. Moderate left sphenoid sinus mucosal thickening. Small amount of fluid in left mastoid. Ocular lens replacements. CTA NECK FINDINGS SKELETON: No acute abnormality or high grade bony spinal canal stenosis. OTHER NECK: Normal pharynx, larynx and major salivary glands. No cervical lymphadenopathy. Unremarkable thyroid gland. UPPER CHEST: Clustered right upper lobe pulmonary nodules measuring up to 8 mm. Left apical scarring. AORTIC ARCH: There is calcific atherosclerosis of the aortic arch. Conventional 3 vessel aortic branching pattern. RIGHT CAROTID SYSTEM: There is bulky mixed density plaque of the distal common carotid artery and proximal internal carotid artery. This causes less than 50% stenosis. No dissection or other acute abnormality. LEFT CAROTID SYSTEM: Mild calcific atherosclerosis of the common carotid artery. Mixed density plaque of the proximal ICA with less than 50% stenosis. VERTEBRAL ARTERIES: Left dominant configuration. There is no dissection, occlusion or flow-limiting stenosis to the skull base (V1-V3 segments). CTA HEAD FINDINGS POSTERIOR CIRCULATION: --Vertebral arteries: Atherosclerotic calcification of the left V4 segment without stenosis. --Inferior cerebellar arteries: Normal. --Basilar artery: Normal. --Superior cerebellar arteries: Normal. --Posterior cerebral arteries (PCA): Normal. ANTERIOR CIRCULATION: --Intracranial internal carotid arteries: Atherosclerotic calcification of the internal carotid arteries at the  skull base without hemodynamically significant stenosis. --Anterior cerebral arteries (ACA): Normal. --Middle cerebral arteries (MCA): Normal. VENOUS SINUSES: As  permitted by contrast timing, patent. ANATOMIC VARIANTS: None Review of the MIP images confirms the above findings. IMPRESSION: 1. No emergent large vessel occlusion or high-grade stenosis of the intracranial arteries. 2. Bulky mixed density plaque of the distal common and proximal internal carotid arteries with less than 50% stenosis. 3. Clustered right upper lobe pulmonary nodules measuring up to 8 mm. Nonemergent chest CT recommended for complete visualization and to guide follow-up recommendations. Aortic Atherosclerosis (ICD10-I70.0). Electronically Signed   By: Deatra Robinson M.D.   On: 04/24/2023 23:29   CT Head Wo Contrast  Result Date: 04/24/2023 CLINICAL DATA:  Left leg weakness and expressive aphasia EXAM: CT HEAD WITHOUT CONTRAST CT ANGIOGRAPHY OF THE HEAD AND NECK TECHNIQUE: Contiguous axial images were obtained from the base of the skull through the vertex without intravenous contrast. Multidetector CT imaging of the head and neck was performed using the standard protocol during bolus administration of intravenous contrast. Multiplanar CT image reconstructions and MIPs were obtained to evaluate the vascular anatomy. Carotid stenosis measurements (when applicable) are obtained utilizing NASCET criteria, using the distal internal carotid diameter as the denominator. RADIATION DOSE REDUCTION: This exam was performed according to the departmental dose-optimization program which includes automated exposure control, adjustment of the mA and/or kV according to patient size and/or use of iterative reconstruction technique. CONTRAST:  75mL OMNIPAQUE IOHEXOL 350 MG/ML SOLN COMPARISON:  Head CT 04/24/2023 FINDINGS: CT HEAD FINDINGS Brain: There is no mass, hemorrhage or extra-axial collection. The size and configuration of the ventricles and  extra-axial CSF spaces are normal. There is hypoattenuation of the white matter, most commonly indicating chronic small vessel disease. Skull: The visualized skull base, calvarium and extracranial soft tissues are normal. Sinuses/Orbits: Postsurgical changes of the left maxillary sinus with moderate mucosal thickening. Moderate left sphenoid sinus mucosal thickening. Small amount of fluid in left mastoid. Ocular lens replacements. CTA NECK FINDINGS SKELETON: No acute abnormality or high grade bony spinal canal stenosis. OTHER NECK: Normal pharynx, larynx and major salivary glands. No cervical lymphadenopathy. Unremarkable thyroid gland. UPPER CHEST: Clustered right upper lobe pulmonary nodules measuring up to 8 mm. Left apical scarring. AORTIC ARCH: There is calcific atherosclerosis of the aortic arch. Conventional 3 vessel aortic branching pattern. RIGHT CAROTID SYSTEM: There is bulky mixed density plaque of the distal common carotid artery and proximal internal carotid artery. This causes less than 50% stenosis. No dissection or other acute abnormality. LEFT CAROTID SYSTEM: Mild calcific atherosclerosis of the common carotid artery. Mixed density plaque of the proximal ICA with less than 50% stenosis. VERTEBRAL ARTERIES: Left dominant configuration. There is no dissection, occlusion or flow-limiting stenosis to the skull base (V1-V3 segments). CTA HEAD FINDINGS POSTERIOR CIRCULATION: --Vertebral arteries: Atherosclerotic calcification of the left V4 segment without stenosis. --Inferior cerebellar arteries: Normal. --Basilar artery: Normal. --Superior cerebellar arteries: Normal. --Posterior cerebral arteries (PCA): Normal. ANTERIOR CIRCULATION: --Intracranial internal carotid arteries: Atherosclerotic calcification of the internal carotid arteries at the skull base without hemodynamically significant stenosis. --Anterior cerebral arteries (ACA): Normal. --Middle cerebral arteries (MCA): Normal. VENOUS SINUSES: As  permitted by contrast timing, patent. ANATOMIC VARIANTS: None Review of the MIP images confirms the above findings. IMPRESSION: 1. No emergent large vessel occlusion or high-grade stenosis of the intracranial arteries. 2. Bulky mixed density plaque of the distal common and proximal internal carotid arteries with less than 50% stenosis. 3. Clustered right upper lobe pulmonary nodules measuring up to 8 mm. Nonemergent chest CT recommended for complete visualization and to guide follow-up recommendations. Aortic Atherosclerosis (ICD10-I70.0). Electronically  Signed   By: Deatra Robinson M.D.   On: 04/24/2023 23:29   DG Chest Portable 1 View  Result Date: 04/24/2023 CLINICAL DATA:  Altered mental status EXAM: PORTABLE CHEST 1 VIEW COMPARISON:  11/03/2022 FINDINGS: Cardiac shadow is stable. Aortic calcifications are seen. The lungs are well aerated bilaterally. No focal infiltrate or effusion is seen. No bony abnormality is noted. IMPRESSION: No active disease. Electronically Signed   By: Alcide Clever M.D.   On: 04/24/2023 23:21    Pending Labs Unresulted Labs (From admission, onward)     Start     Ordered   04/24/23 2343  Culture, blood (routine x 2)  BLOOD CULTURE X 2,   R (with STAT occurrences)      04/24/23 2342   04/24/23 2104  Urine Culture  Once,   URGENT       Question:  Indication  Answer:  Altered mental status (if no other cause identified)   04/24/23 2104   Signed and Held  Comprehensive metabolic panel  Tomorrow morning,   R        Signed and Held   Signed and Held  CBC  Tomorrow morning,   R        Signed and Held            Vitals/Pain Today's Vitals   04/24/23 2130 04/24/23 2147 04/24/23 2300 04/24/23 2330  BP:  (!) 155/86  (!) 164/80  Pulse: 85 (!) 25  63  Resp: (!) 22 13  15   Temp:   97.7 F (36.5 C) 97.7 F (36.5 C)  TempSrc:   Oral   SpO2: 97% 94%  98%  Weight:      Height:      PainSc:        Isolation Precautions No active  isolations  Medications Medications  iohexol (OMNIPAQUE) 350 MG/ML injection 75 mL (75 mLs Intravenous Contrast Given 04/24/23 2254)  cefTRIAXone (ROCEPHIN) 1 g in sodium chloride 0.9 % 100 mL IVPB (1 g Intravenous Other (enter comment in med admin window) 04/25/23 0036)  sodium chloride 0.9 % bolus 500 mL (500 mLs Intravenous Other (enter comment in med admin window) 04/25/23 1610)    Mobility walks with device

## 2023-04-25 NOTE — Progress Notes (Signed)
PROGRESS NOTE    Craig Snow.  NFA:[id number] DOB: 03-27-1921 DOA: 04/24/2023 PCP: Daisy Floro, MD   Brief Narrative:  HPI: Craig Snow. is a 89 y.o. male with medical history significant of atrial fibrillation, GERD, hyperlipidemia, essential hypertension, independent Foley catheter, history of bladder cancer and BPH who presents to the ER with his son but he lives with with a complaint of generalized weakness and aphasia.  Initially CVA suspected.  So far head CT without contrast and CTA were negative.  Urinalysis however shows evidence of UTI.  MRI currently pending.  Patient is being admitted for generalized weakness as well as possible UTI as a cause.  No fever no chills no nausea vomiting or diarrhea at this point.  Patient is still remains partially aphasic.   Assessment & Plan:   Principal Problem:   General weakness Active Problems:   Atrial fibrillation (HCC)   UTI (urinary tract infection)   History of bladder cancer   HLD (hyperlipidemia)   HTN (hypertension)   Hypothyroidism  Aphasia/generalized weakness secondary to UTI: Stroke ruled out with MRI head negative.  UA highly consistent with UTI.  Patient on Rocephin.  Follow culture.  Seen by PT OT, home health recommended.  Paroxysmal atrial fibrillation: Controlled.  Continue metoprolol.  Per reports, patient is not taking warfarin by his own choice.  Delirium: Son at the bedside says that patient has been having visual hallucinations and it is not uncommon for him to have hospital-acquired delirium and currently he is delirious.  Trying to get out of the chair.  We will treat him with nightly Seroquel and 1. Avoid benzodiazepines, antihistamines, anticholinergics, and minimize opiate use as these may worsen delirium. 2: Assess, prevent and manage pain as lack of treatment can result in delirium.  3: Provide appropriate lighting and clear signage; a clock and calendar should be easily visible to the  patient. 4: Monitor environmental factors. Reduce light and noise at night (close shades, turn off lights, turn off TV, ect). Correct any alterations in sleep cycle. 5: Reorient the patient to person, place, time and situation on each encounter.  6: Correct sensory deficits if possible (replace eye glasses, hearing aids, ect). 7: Avoid restraints if able. Severely delirious patients benefit from constant observation by a sitter.  Acquired hypothyroidism: Continue home medications.  Essential hypertension: Continue metoprolol.  Hyperlipidemia: Atorvastatin.  History of bladder cancer/BPH: Has chronic indwelling Foley catheter.  Continue Flomax.  DVT prophylaxis: Start Lovenox   Code Status: Do not attempt resuscitation (DNR) PRE-ARREST INTERVENTIONS DESIRED  Family Communication: Son present at bedside.  Plan of care discussed with patient in length and he/she verbalized understanding and agreed with it.  Status is: Inpatient Remains inpatient appropriate because: Patient delirious, await for urine culture.  Not back to baseline yet.   Estimated body mass index is 19.36 kg/m as calculated from the following:   Height as of this encounter: 5\' 11"  (1.803 m).   Weight as of this encounter: 63 kg.    Nutritional Assessment: Body mass index is 19.36 kg/m.Marland Kitchen Seen by dietician.  I agree with the assessment and plan as outlined below: Nutrition Status:        . Skin Assessment: I have examined the patient's skin and I agree with the wound assessment as performed by the wound care RN as outlined below:    Consultants:  None  Procedures:  None  Antimicrobials:  Anti-infectives (From admission, onward)    Start  Dose/Rate Route Frequency Ordered Stop   04/25/23 2200  cefTRIAXone (ROCEPHIN) 1 g in sodium chloride 0.9 % 100 mL IVPB        1 g 200 mL/hr over 30 Minutes Intravenous Every 24 hours 04/25/23 0339     04/24/23 2345  cefTRIAXone (ROCEPHIN) 1 g in sodium chloride  0.9 % 100 mL IVPB        1 g 200 mL/hr over 30 Minutes Intravenous  Once 04/24/23 2343 04/25/23 0232         Subjective: Patient seen and examined.  Patient has no aphasia.  His speech is slightly dysarthric but that is his usual speech and he is back to baseline as far as his speech goes.  However he is confused.  Objective: Vitals:   04/24/23 2300 04/24/23 2330 04/25/23 0208 04/25/23 0319  BP:  (!) 164/80 (!) 174/95 (!) 174/100  Pulse:  63 64 68  Resp:  15 15 17   Temp: 97.7 F (36.5 C) 97.7 F (36.5 C)  97.9 F (36.6 C)  TempSrc: Oral   Oral  SpO2:  98% 99% 99%  Weight:    63 kg  Height:        Intake/Output Summary (Last 24 hours) at 04/25/2023 0759 Last data filed at 04/25/2023 1884 Gross per 24 hour  Intake 191.6 ml  Output --  Net 191.6 ml   Filed Weights   04/24/23 1853 04/25/23 0319  Weight: 61.8 kg 63 kg    Examination:  General exam: Appears calm and comfortable but confused Respiratory system: Clear to auscultation. Respiratory effort normal. Cardiovascular system: S1 & S2 heard, RRR. No JVD, murmurs, rubs, gallops or clicks. No pedal edema. Gastrointestinal system: Abdomen is nondistended, soft and nontender. No organomegaly or masses felt. Normal bowel sounds heard. Central nervous system: Alert but confused. No focal neurological deficits. Extremities: Symmetric 5 x 5 power.   Data Reviewed: I have personally reviewed following labs and imaging studies  CBC: Recent Labs  Lab 04/24/23 2025 04/25/23 0539  WBC 7.9 8.7  HGB 13.3 13.2  HCT 40.6 40.2  MCV 109.4* 109.5*  PLT 155 149*   Basic Metabolic Panel: Recent Labs  Lab 04/24/23 2025 04/25/23 0539  NA 136 137  K 4.2 3.9  CL 99 100  CO2 26 26  GLUCOSE 94 93  BUN 29* 25*  CREATININE 1.02 0.91  CALCIUM 10.3 10.1   GFR: Estimated Creatinine Clearance: 39.4 mL/min (by C-G formula based on SCr of 0.91 mg/dL). Liver Function Tests: Recent Labs  Lab 04/24/23 2025 04/25/23 0539   AST 30 26  ALT 21 19  ALKPHOS 97 88  BILITOT 1.0 1.1  PROT 7.5 7.1  ALBUMIN 4.2 3.8   No results for input(s): "LIPASE", "AMYLASE" in the last 168 hours. No results for input(s): "AMMONIA" in the last 168 hours. Coagulation Profile: Recent Labs  Lab 04/25/23 0004  INR 1.1   Cardiac Enzymes: No results for input(s): "CKTOTAL", "CKMB", "CKMBINDEX", "TROPONINI" in the last 168 hours. BNP (last 3 results) No results for input(s): "PROBNP" in the last 8760 hours. HbA1C: No results for input(s): "HGBA1C" in the last 72 hours. CBG: Recent Labs  Lab 04/24/23 2023  GLUCAP 97   Lipid Profile: No results for input(s): "CHOL", "HDL", "LDLCALC", "TRIG", "CHOLHDL", "LDLDIRECT" in the last 72 hours. Thyroid Function Tests: No results for input(s): "TSH", "T4TOTAL", "FREET4", "T3FREE", "THYROIDAB" in the last 72 hours. Anemia Panel: No results for input(s): "VITAMINB12", "FOLATE", "FERRITIN", "TIBC", "IRON", "RETICCTPCT" in the  last 72 hours. Sepsis Labs: Recent Labs  Lab 04/25/23 0018  LATICACIDVEN 1.2    No results found for this or any previous visit (from the past 240 hour(s)).   Radiology Studies: MR BRAIN WO CONTRAST  Result Date: 04/25/2023 CLINICAL DATA:  Altered mental status with slurred speech and weakness. EXAM: MRI HEAD WITHOUT CONTRAST TECHNIQUE: Multiplanar, multiecho pulse sequences of the brain and surrounding structures were obtained without intravenous contrast. COMPARISON:  None Available. FINDINGS: Brain: No acute infarct, mass effect or extra-axial collection. No acute or chronic hemorrhage. There is multifocal hyperintense T2-weighted signal within the white matter. Generalized volume loss. The midline structures are normal. Punctate focus of diffusion restriction of the right choroid plexus, possibly due to xanthogranulomatous change. Vascular: Normal flow voids. Skull and upper cervical spine: No acute finding Sinuses/Orbits:Left maxillary sinus postsurgical  change. No acute abnormality. Left mastoid fluid. IMPRESSION: 1. No acute intracranial abnormality. 2. Findings of chronic small vessel ischemia and generalized volume loss. Electronically Signed   By: Deatra Robinson M.D.   On: 04/25/2023 02:43   CT ANGIO HEAD NECK W WO CM  Result Date: 04/24/2023 CLINICAL DATA:  Left leg weakness and expressive aphasia EXAM: CT HEAD WITHOUT CONTRAST CT ANGIOGRAPHY OF THE HEAD AND NECK TECHNIQUE: Contiguous axial images were obtained from the base of the skull through the vertex without intravenous contrast. Multidetector CT imaging of the head and neck was performed using the standard protocol during bolus administration of intravenous contrast. Multiplanar CT image reconstructions and MIPs were obtained to evaluate the vascular anatomy. Carotid stenosis measurements (when applicable) are obtained utilizing NASCET criteria, using the distal internal carotid diameter as the denominator. RADIATION DOSE REDUCTION: This exam was performed according to the departmental dose-optimization program which includes automated exposure control, adjustment of the mA and/or kV according to patient size and/or use of iterative reconstruction technique. CONTRAST:  75mL OMNIPAQUE IOHEXOL 350 MG/ML SOLN COMPARISON:  Head CT 04/24/2023 FINDINGS: CT HEAD FINDINGS Brain: There is no mass, hemorrhage or extra-axial collection. The size and configuration of the ventricles and extra-axial CSF spaces are normal. There is hypoattenuation of the white matter, most commonly indicating chronic small vessel disease. Skull: The visualized skull base, calvarium and extracranial soft tissues are normal. Sinuses/Orbits: Postsurgical changes of the left maxillary sinus with moderate mucosal thickening. Moderate left sphenoid sinus mucosal thickening. Small amount of fluid in left mastoid. Ocular lens replacements. CTA NECK FINDINGS SKELETON: No acute abnormality or high grade bony spinal canal stenosis. OTHER  NECK: Normal pharynx, larynx and major salivary glands. No cervical lymphadenopathy. Unremarkable thyroid gland. UPPER CHEST: Clustered right upper lobe pulmonary nodules measuring up to 8 mm. Left apical scarring. AORTIC ARCH: There is calcific atherosclerosis of the aortic arch. Conventional 3 vessel aortic branching pattern. RIGHT CAROTID SYSTEM: There is bulky mixed density plaque of the distal common carotid artery and proximal internal carotid artery. This causes less than 50% stenosis. No dissection or other acute abnormality. LEFT CAROTID SYSTEM: Mild calcific atherosclerosis of the common carotid artery. Mixed density plaque of the proximal ICA with less than 50% stenosis. VERTEBRAL ARTERIES: Left dominant configuration. There is no dissection, occlusion or flow-limiting stenosis to the skull base (V1-V3 segments). CTA HEAD FINDINGS POSTERIOR CIRCULATION: --Vertebral arteries: Atherosclerotic calcification of the left V4 segment without stenosis. --Inferior cerebellar arteries: Normal. --Basilar artery: Normal. --Superior cerebellar arteries: Normal. --Posterior cerebral arteries (PCA): Normal. ANTERIOR CIRCULATION: --Intracranial internal carotid arteries: Atherosclerotic calcification of the internal carotid arteries at the skull base without hemodynamically  significant stenosis. --Anterior cerebral arteries (ACA): Normal. --Middle cerebral arteries (MCA): Normal. VENOUS SINUSES: As permitted by contrast timing, patent. ANATOMIC VARIANTS: None Review of the MIP images confirms the above findings. IMPRESSION: 1. No emergent large vessel occlusion or high-grade stenosis of the intracranial arteries. 2. Bulky mixed density plaque of the distal common and proximal internal carotid arteries with less than 50% stenosis. 3. Clustered right upper lobe pulmonary nodules measuring up to 8 mm. Nonemergent chest CT recommended for complete visualization and to guide follow-up recommendations. Aortic Atherosclerosis  (ICD10-I70.0). Electronically Signed   By: Deatra Robinson M.D.   On: 04/24/2023 23:29   CT Head Wo Contrast  Result Date: 04/24/2023 CLINICAL DATA:  Left leg weakness and expressive aphasia EXAM: CT HEAD WITHOUT CONTRAST CT ANGIOGRAPHY OF THE HEAD AND NECK TECHNIQUE: Contiguous axial images were obtained from the base of the skull through the vertex without intravenous contrast. Multidetector CT imaging of the head and neck was performed using the standard protocol during bolus administration of intravenous contrast. Multiplanar CT image reconstructions and MIPs were obtained to evaluate the vascular anatomy. Carotid stenosis measurements (when applicable) are obtained utilizing NASCET criteria, using the distal internal carotid diameter as the denominator. RADIATION DOSE REDUCTION: This exam was performed according to the departmental dose-optimization program which includes automated exposure control, adjustment of the mA and/or kV according to patient size and/or use of iterative reconstruction technique. CONTRAST:  75mL OMNIPAQUE IOHEXOL 350 MG/ML SOLN COMPARISON:  Head CT 04/24/2023 FINDINGS: CT HEAD FINDINGS Brain: There is no mass, hemorrhage or extra-axial collection. The size and configuration of the ventricles and extra-axial CSF spaces are normal. There is hypoattenuation of the white matter, most commonly indicating chronic small vessel disease. Skull: The visualized skull base, calvarium and extracranial soft tissues are normal. Sinuses/Orbits: Postsurgical changes of the left maxillary sinus with moderate mucosal thickening. Moderate left sphenoid sinus mucosal thickening. Small amount of fluid in left mastoid. Ocular lens replacements. CTA NECK FINDINGS SKELETON: No acute abnormality or high grade bony spinal canal stenosis. OTHER NECK: Normal pharynx, larynx and major salivary glands. No cervical lymphadenopathy. Unremarkable thyroid gland. UPPER CHEST: Clustered right upper lobe pulmonary  nodules measuring up to 8 mm. Left apical scarring. AORTIC ARCH: There is calcific atherosclerosis of the aortic arch. Conventional 3 vessel aortic branching pattern. RIGHT CAROTID SYSTEM: There is bulky mixed density plaque of the distal common carotid artery and proximal internal carotid artery. This causes less than 50% stenosis. No dissection or other acute abnormality. LEFT CAROTID SYSTEM: Mild calcific atherosclerosis of the common carotid artery. Mixed density plaque of the proximal ICA with less than 50% stenosis. VERTEBRAL ARTERIES: Left dominant configuration. There is no dissection, occlusion or flow-limiting stenosis to the skull base (V1-V3 segments). CTA HEAD FINDINGS POSTERIOR CIRCULATION: --Vertebral arteries: Atherosclerotic calcification of the left V4 segment without stenosis. --Inferior cerebellar arteries: Normal. --Basilar artery: Normal. --Superior cerebellar arteries: Normal. --Posterior cerebral arteries (PCA): Normal. ANTERIOR CIRCULATION: --Intracranial internal carotid arteries: Atherosclerotic calcification of the internal carotid arteries at the skull base without hemodynamically significant stenosis. --Anterior cerebral arteries (ACA): Normal. --Middle cerebral arteries (MCA): Normal. VENOUS SINUSES: As permitted by contrast timing, patent. ANATOMIC VARIANTS: None Review of the MIP images confirms the above findings. IMPRESSION: 1. No emergent large vessel occlusion or high-grade stenosis of the intracranial arteries. 2. Bulky mixed density plaque of the distal common and proximal internal carotid arteries with less than 50% stenosis. 3. Clustered right upper lobe pulmonary nodules measuring up to 8 mm. Nonemergent  chest CT recommended for complete visualization and to guide follow-up recommendations. Aortic Atherosclerosis (ICD10-I70.0). Electronically Signed   By: Deatra Robinson M.D.   On: 04/24/2023 23:29   DG Chest Portable 1 View  Result Date: 04/24/2023 CLINICAL DATA:   Altered mental status EXAM: PORTABLE CHEST 1 VIEW COMPARISON:  11/03/2022 FINDINGS: Cardiac shadow is stable. Aortic calcifications are seen. The lungs are well aerated bilaterally. No focal infiltrate or effusion is seen. No bony abnormality is noted. IMPRESSION: No active disease. Electronically Signed   By: Alcide Clever M.D.   On: 04/24/2023 23:21    Scheduled Meds:  atorvastatin  10 mg Oral QHS   levothyroxine  50 mcg Oral Q0600   liothyronine  5 mcg Oral q morning   metoprolol tartrate  25 mg Oral BID   mirtazapine  15 mg Oral QHS   sodium chloride flush  3 mL Intravenous Q12H   Continuous Infusions:  cefTRIAXone (ROCEPHIN)  IV       LOS: 1 day   Craig Closs, MD [facility]  04/25/2023, 7:59 AM   *Please note that this is a verbal dictation therefore any spelling or grammatical errors are due to the "Dragon [employer] One" system interpretation.  Please page via Amion and do not message via secure chat for urgent patient care matters. Secure chat can be used for non urgent patient care matters.  How to contact the [facility] Attending or Consulting provider 7A - 7P or covering provider during after hours 7P -7A, for this patient?  Check the care team in [facility] and look for a) attending/consulting [facility] provider listed and b) the [facility] team listed. Page or secure chat 7A-7P. Log into [email or web address] and use [place] universal password to access. If you do not have the password, please contact the hospital operator. Locate the [facility] provider you are looking for under [facility] and page to a number that you can be directly reached. If you still have difficulty reaching the provider, please page the [facility] (Director on Call) for the Hospitalists listed on amion for assistance.

## 2023-04-26 ENCOUNTER — Ambulatory Visit: Payer: Medicare Other | Admitting: Podiatry

## 2023-04-26 DIAGNOSIS — R531 Weakness: Secondary | ICD-10-CM | POA: Diagnosis not present

## 2023-04-26 MED ORDER — SODIUM CHLORIDE 0.9% FLUSH
3.0000 mL | Freq: Two times a day (BID) | INTRAVENOUS | Status: DC
  Administered 2023-04-26 – 2023-04-28 (×3): 3 mL via INTRAVENOUS

## 2023-04-26 MED ORDER — SODIUM CHLORIDE 0.9 % IV SOLN: INTRAVENOUS | Status: DC

## 2023-04-26 MED ORDER — SODIUM CHLORIDE 0.9 % IV BOLUS
1000.0000 mL | Freq: Once | INTRAVENOUS | Status: AC
  Administered 2023-04-26: 1000 mL via INTRAVENOUS

## 2023-04-26 MED ORDER — NITROFURANTOIN MONOHYD MACRO 100 MG PO CAPS
100.0000 mg | ORAL_CAPSULE | Freq: Two times a day (BID) | ORAL | 0 refills | Status: DC
Start: 2023-04-26 — End: 2023-05-03

## 2023-04-26 MED ORDER — QUETIAPINE FUMARATE 25 MG PO TABS: 25.0000 mg | ORAL_TABLET | Freq: Every day | ORAL | 0 refills | Status: DC

## 2023-04-26 NOTE — TOC Transition Note (Signed)
Transition of Care [facility]) - CM/SW Discharge Note   Patient Details  Name: Craig Snow. MRN: [id number] Date of Birth: 11/04/1920  [facility] CM/SW Contact:  Lanier Clam, RN Phone Number: 04/26/2023, 11:39 AM   Clinical Narrative: spoke to [name](Son) about d/c plan-continue w/otpt PT [facility]. Has own transport home. No further CM needs.      Final next level of care: Home/Self Care Barriers to Discharge: No Barriers Identified   Patient Goals and CMS Choice CMS Medicare.gov Compare Post Acute Care list provided to:: Patient Represenative (must comment) Choice offered to / list presented to : Adult Children  Discharge Placement                         Discharge Plan and Services Additional resources added to the After Visit Summary for     Discharge Planning Services: CM Consult                                 Social Determinants of Health (SDOH) Interventions SDOH Screenings   Food Insecurity: No Food Insecurity (04/25/2023)  Housing: Low Risk  (04/25/2023)  Transportation Needs: No Transportation Needs (04/25/2023)  Utilities: Not At Risk (04/25/2023)  Tobacco Use: Medium Risk (04/24/2023)     Readmission Risk Interventions    04/15/2022    1:03 PM  Readmission Risk Prevention Plan  Transportation Screening Complete  PCP or Specialist Appt within 5-7 Days Complete  Home Care Screening Complete  Medication Review (RN CM) Complete

## 2023-04-26 NOTE — Plan of Care (Signed)
  Problem: Pain Managment: Goal: General experience of comfort will improve Outcome: Progressing   Problem: Safety: Goal: Ability to remain free from injury will improve Outcome: Progressing   Problem: Skin Integrity: Goal: Risk for impaired skin integrity will decrease Outcome: Progressing   

## 2023-04-26 NOTE — Progress Notes (Signed)
   04/26/23 1108  Vitals  BP (!) 87/49  BP Location Left Arm  BP Method Automatic  Patient Position (if appropriate) Lying  Pulse Rate (!) 41  Pulse Rate Source Monitor  Resp 17  Level of Consciousness  Level of Consciousness Responds to Voice  ECG Monitoring  CV Strip Heart Rate 35  Cardiac Rhythm Atrial flutter;BBB   Called by central telemetry and informed that pt has been brady in the 30-40's. Pt is sleeping, awakens on calling but falls immediately back to sleep.  O2 sat 98% on RA. More drowsy than earlier when he was quite restless and hallucinating. Dr Jacqulyn Bath aware. EKG and IVF bolus ordered and initiated.

## 2023-04-26 NOTE — Discharge Summary (Addendum)
Physician Discharge Summary  Craig Snow. WGN:[id number] DOB: Sep 21, 1920 DOA: 04/24/2023  PCP: Daisy Floro, MD  Admit date: 04/24/2023 Discharge date: 04/26/2023 30 Day Unplanned Readmission Risk Score    Flowsheet Row ED to Hosp-Admission (Current) from 04/24/2023 in [facility] 4TH FLOOR PROGRESSIVE CARE AND UROLOGY  30 Day Unplanned Readmission Risk Score (%) 13.7 Filed at 04/26/2023 0801       This score is the patient's risk of an unplanned readmission within 30 days of being discharged (0 -100%). The score is based on dignosis, age, lab data, medications, orders, and past utilization.   Low:  0-14.9   Medium: 15-21.9   High: 22-29.9   Extreme: 30 and above          Admitted From: Home Disposition: Home  Recommendations for Outpatient Follow-up:  Follow up with PCP in 1-2 weeks Please obtain BMP/CBC in one week Please follow up with your PCP on the following pending results: Unresulted Labs (From admission, onward)    None         Home Health: Yes Equipment/Devices: None  Discharge Condition: Stable CODE STATUS: DNR Diet recommendation: Previous home diet  Subjective: Patient seen and examined.  Patient appears to be slightly confused.  Son at the bedside.  Very lengthy discussion with the son.  Son was frustrated because patient did not receive any IV fluids.  I had explained to him the rationale that since patient was alert and oriented and was able to take p.o. intake.  No signs of dehydration, thus he was not given an IV fluids.  His BMP today is within normal limits indicating that he is not dehydrated.  Son continued to argue that patient does not take enough p.o.  And remained argumentative and upset despite of explaining rationale multiple times.  Although patient is confused and delirious.  Son himself appears to be very much aware and familiar as well as educated about hospital-acquired delirium.  He does agree that patient is going through  hospital-acquired delirium and that patient has better chances of getting better at home with family environment.  I have started him on Seroquel last night.  Which was given at 8 PM, right when I had asked the nurses to give the medications.  The son is convinced that patient needs more than 25 mg and may be more than once a night only because, according to son, his aunt who is younger and healthier than the patient was started on higher dose and twice daily.  He continued to argue with me on the dosage as well.  I had to spend a lot of time explaining on that as well that patient being 89 year old and never given Seroquel, it was wise to start him on the low-dose and once daily.  I recommended against using Seroquel during the daytime at this point since this may cause drowsiness and may cause falls.  I recommended that he takes Seroquel at night scheduled at 8 PM and advised the son to monitor results and recovery from delirium.  If this medication suits him then PCP can further prescribe him.  I have prescribed him 15 days of medications.  I also strongly advised him to give him the medication at 8 PM.  The son continue to argue that he would rather give him at 6 PM with other medications and right before the dinner.  I advised him that patient is not supposed to fall asleep right after eating dinner as that may cause GERD  and may end up causing aspiration pneumonia.  RN and sitter witnessed the conversation.   Brief/Interim Summary: Craig Snow. is a 89 y.o. male with medical history significant of atrial fibrillation, GERD, hyperlipidemia, essential hypertension, independent Foley catheter, history of bladder cancer and BPH who presented to the ER with his son but he lives with with a complaint of generalized weakness and aphasia.  Initially CVA suspected.  So far head CT without contrast and CTA were negative.  Urinalysis however shows evidence of UTI.  MRI head negative for stroke.  Patient's aphasia  resolved.  Further details below.   Aphasia/generalized weakness secondary to UTI: Stroke ruled out with MRI head negative.  May have TIA.  UA highly consistent with UTI.  Patient on Rocephin.  Culture still cooking.  Patient is allergic to sulfa drugs and thus I am discharging this patient on Macrobid for 7 days.   Paroxysmal atrial fibrillation: Controlled.  Continue metoprolol.  Per reports and I confirmed this with the son, patient is not taking warfarin by his own choice.   Delirium: Details above.   Acquired hypothyroidism: Continue home medications.   Essential hypertension: Continue metoprolol.   Hyperlipidemia: Atorvastatin.   History of bladder cancer/BPH: Has chronic indwelling Foley catheter.  Continue Flomax.  Addendum: After discharge was completed.  I was informed that patient had an episode of bradycardia with rates in 30s with atrial fibrillation for about 30 minutes.  Patient was sleeping without any symptoms.  Son was at the bedside.  Blood pressure was also low 87/49.  1 L of IV fluid bolus was ordered.  Patient's blood pressure improved to 122/70.  I revisited patient at the bedside around 1 PM.  Discussed with the son.  We decided that we will let patient sleep as long as he wants to as he had not slept in a few days.  His heart rate had improved to 68 when I visited.  Due to sleep and restlessness overnight, he has not had much of the intake so we decided to give him another 1 L of normal saline fluid over 4 hours and the plan mutually agreed upon was to take him home later today when he is awake.  Family still believes that patient's delirium will get better at home and prefers to take him home.  Patient's daughter-in-law also showed up during my second visit.  Discharge plan was discussed with patient and/or family member and they verbalized understanding and agreed with it.  Discharge Diagnoses:  Principal Problem:   General weakness Active Problems:   Atrial  fibrillation (HCC)   UTI (urinary tract infection)   History of bladder cancer   HLD (hyperlipidemia)   HTN (hypertension)   Hypothyroidism    Discharge Instructions   Allergies as of 04/26/2023       Reactions   Aspirin Other (See Comments)   Unknown reaction - listed on [facility] 07/30/21   Ibuprofen Anaphylaxis   Clindamycin/lincomycin Other (See Comments)   Unknown reaction - listed on [facility] 07/30/21   Other Other (See Comments)   Unknown reaction to opioids - listed on [facility] 07/30/21 hallucinations   Sulfa Antibiotics Other (See Comments)   Unknown reaction - listed on [facility] 07/30/21   Tramadol Other (See Comments)   Hallucinations/ Currently taking for severe pain        Medication List     STOP taking these medications    ciprofloxacin 250 MG tablet Commonly known as: CIPRO   ferrous sulfate 325 (  65 FE) MG tablet   pantoprazole 40 MG tablet Commonly known as: PROTONIX   PRESCRIPTION MEDICATION   Vitamin D (Ergocalciferol) 1.25 MG (50000 UNIT) Caps capsule Commonly known as: DRISDOL   warfarin 2 MG tablet Commonly known as: COUMADIN   warfarin 3 MG tablet Commonly known as: COUMADIN       TAKE these medications    acetaminophen 650 MG CR tablet Commonly known as: TYLENOL Take 650-1,300 mg by mouth every 8 (eight) hours as needed for pain.   atorvastatin 10 MG tablet Commonly known as: LIPITOR Take 10 mg by mouth at bedtime.   furosemide 20 MG tablet Commonly known as: LASIX Take 20 mg by mouth at bedtime.   levothyroxine 50 MCG tablet Commonly known as: SYNTHROID Take 50 mcg by mouth every morning.   liothyronine 5 MCG tablet Commonly known as: CYTOMEL Take 5 mcg by mouth every morning.   Melatonin 5 MG Caps Take 10 mg by mouth at bedtime.   metoprolol tartrate 25 MG tablet Commonly known as: LOPRESSOR Take 1 tablet (25 mg total) by mouth 2 (two) times daily.   mirtazapine 15 MG tablet Commonly known as: REMERON Take 15 mg by mouth at  bedtime.   nitrofurantoin (macrocrystal-monohydrate) 100 MG capsule Commonly known as: Macrobid Take 1 capsule (100 mg total) by mouth 2 (two) times daily for 7 days.   potassium chloride 10 MEQ tablet Commonly known as: KLOR-CON Take 10 mEq by mouth daily.   QUEtiapine 25 MG tablet Commonly known as: SEROQUEL Take 1 tablet (25 mg total) by mouth at bedtime for 15 days.   traMADol 50 MG tablet Commonly known as: ULTRAM Take 25 mg by mouth daily as needed for severe pain.        Follow-up Information     Daisy Floro, MD Follow up in 1 week(s).   Specialty: Family Medicine Contact information: [address] [place] [address] [phone]         [facility] Follow up.   Contact information: [address] [place] [address] [place] [address] [phone]               Allergies  Allergen Reactions   Aspirin Other (See Comments)    Unknown reaction - listed on [facility] 07/30/21   Ibuprofen Anaphylaxis   Clindamycin/Lincomycin Other (See Comments)    Unknown reaction - listed on [facility] 07/30/21   Other Other (See Comments)    Unknown reaction to opioids - listed on [facility] 07/30/21 hallucinations   Sulfa Antibiotics Other (See Comments)    Unknown reaction - listed on [facility] 07/30/21   Tramadol Other (See Comments)    Hallucinations/ Currently taking for severe pain    Consultations: None   Procedures/Studies: MR BRAIN WO CONTRAST  Result Date: 04/25/2023 CLINICAL DATA:  Altered mental status with slurred speech and weakness. EXAM: MRI HEAD WITHOUT CONTRAST TECHNIQUE: Multiplanar, multiecho pulse sequences of the brain and surrounding structures were obtained without intravenous contrast. COMPARISON:  None Available. FINDINGS: Brain: No acute infarct, mass effect or extra-axial collection. No acute or chronic hemorrhage. There is multifocal hyperintense T2-weighted signal within the white matter. Generalized volume loss.  The midline structures are normal. Punctate focus of diffusion restriction of the right choroid plexus, possibly due to xanthogranulomatous change. Vascular: Normal flow voids. Skull and upper cervical spine: No acute finding Sinuses/Orbits:Left maxillary sinus postsurgical change. No acute abnormality. Left mastoid fluid. IMPRESSION: 1. No acute intracranial abnormality. 2. Findings of chronic  small vessel ischemia and generalized volume loss. Electronically Signed   By: Deatra Robinson M.D.   On: 04/25/2023 02:43   CT ANGIO HEAD NECK W WO CM  Result Date: 04/24/2023 CLINICAL DATA:  Left leg weakness and expressive aphasia EXAM: CT HEAD WITHOUT CONTRAST CT ANGIOGRAPHY OF THE HEAD AND NECK TECHNIQUE: Contiguous axial images were obtained from the base of the skull through the vertex without intravenous contrast. Multidetector CT imaging of the head and neck was performed using the standard protocol during bolus administration of intravenous contrast. Multiplanar CT image reconstructions and MIPs were obtained to evaluate the vascular anatomy. Carotid stenosis measurements (when applicable) are obtained utilizing NASCET criteria, using the distal internal carotid diameter as the denominator. RADIATION DOSE REDUCTION: This exam was performed according to the departmental dose-optimization program which includes automated exposure control, adjustment of the mA and/or kV according to patient size and/or use of iterative reconstruction technique. CONTRAST:  75mL OMNIPAQUE IOHEXOL 350 MG/ML SOLN COMPARISON:  Head CT 04/24/2023 FINDINGS: CT HEAD FINDINGS Brain: There is no mass, hemorrhage or extra-axial collection. The size and configuration of the ventricles and extra-axial CSF spaces are normal. There is hypoattenuation of the white matter, most commonly indicating chronic small vessel disease. Skull: The visualized skull base, calvarium and extracranial soft tissues are normal. Sinuses/Orbits: Postsurgical changes  of the left maxillary sinus with moderate mucosal thickening. Moderate left sphenoid sinus mucosal thickening. Small amount of fluid in left mastoid. Ocular lens replacements. CTA NECK FINDINGS SKELETON: No acute abnormality or high grade bony spinal canal stenosis. OTHER NECK: Normal pharynx, larynx and major salivary glands. No cervical lymphadenopathy. Unremarkable thyroid gland. UPPER CHEST: Clustered right upper lobe pulmonary nodules measuring up to 8 mm. Left apical scarring. AORTIC ARCH: There is calcific atherosclerosis of the aortic arch. Conventional 3 vessel aortic branching pattern. RIGHT CAROTID SYSTEM: There is bulky mixed density plaque of the distal common carotid artery and proximal internal carotid artery. This causes less than 50% stenosis. No dissection or other acute abnormality. LEFT CAROTID SYSTEM: Mild calcific atherosclerosis of the common carotid artery. Mixed density plaque of the proximal ICA with less than 50% stenosis. VERTEBRAL ARTERIES: Left dominant configuration. There is no dissection, occlusion or flow-limiting stenosis to the skull base (V1-V3 segments). CTA HEAD FINDINGS POSTERIOR CIRCULATION: --Vertebral arteries: Atherosclerotic calcification of the left V4 segment without stenosis. --Inferior cerebellar arteries: Normal. --Basilar artery: Normal. --Superior cerebellar arteries: Normal. --Posterior cerebral arteries (PCA): Normal. ANTERIOR CIRCULATION: --Intracranial internal carotid arteries: Atherosclerotic calcification of the internal carotid arteries at the skull base without hemodynamically significant stenosis. --Anterior cerebral arteries (ACA): Normal. --Middle cerebral arteries (MCA): Normal. VENOUS SINUSES: As permitted by contrast timing, patent. ANATOMIC VARIANTS: None Review of the MIP images confirms the above findings. IMPRESSION: 1. No emergent large vessel occlusion or high-grade stenosis of the intracranial arteries. 2. Bulky mixed density plaque of the  distal common and proximal internal carotid arteries with less than 50% stenosis. 3. Clustered right upper lobe pulmonary nodules measuring up to 8 mm. Nonemergent chest CT recommended for complete visualization and to guide follow-up recommendations. Aortic Atherosclerosis (ICD10-I70.0). Electronically Signed   By: Deatra Robinson M.D.   On: 04/24/2023 23:29   CT Head Wo Contrast  Result Date: 04/24/2023 CLINICAL DATA:  Left leg weakness and expressive aphasia EXAM: CT HEAD WITHOUT CONTRAST CT ANGIOGRAPHY OF THE HEAD AND NECK TECHNIQUE: Contiguous axial images were obtained from the base of the skull through the vertex without intravenous contrast. Multidetector CT imaging of the  head and neck was performed using the standard protocol during bolus administration of intravenous contrast. Multiplanar CT image reconstructions and MIPs were obtained to evaluate the vascular anatomy. Carotid stenosis measurements (when applicable) are obtained utilizing NASCET criteria, using the distal internal carotid diameter as the denominator. RADIATION DOSE REDUCTION: This exam was performed according to the departmental dose-optimization program which includes automated exposure control, adjustment of the mA and/or kV according to patient size and/or use of iterative reconstruction technique. CONTRAST:  75mL OMNIPAQUE IOHEXOL 350 MG/ML SOLN COMPARISON:  Head CT 04/24/2023 FINDINGS: CT HEAD FINDINGS Brain: There is no mass, hemorrhage or extra-axial collection. The size and configuration of the ventricles and extra-axial CSF spaces are normal. There is hypoattenuation of the white matter, most commonly indicating chronic small vessel disease. Skull: The visualized skull base, calvarium and extracranial soft tissues are normal. Sinuses/Orbits: Postsurgical changes of the left maxillary sinus with moderate mucosal thickening. Moderate left sphenoid sinus mucosal thickening. Small amount of fluid in left mastoid. Ocular lens  replacements. CTA NECK FINDINGS SKELETON: No acute abnormality or high grade bony spinal canal stenosis. OTHER NECK: Normal pharynx, larynx and major salivary glands. No cervical lymphadenopathy. Unremarkable thyroid gland. UPPER CHEST: Clustered right upper lobe pulmonary nodules measuring up to 8 mm. Left apical scarring. AORTIC ARCH: There is calcific atherosclerosis of the aortic arch. Conventional 3 vessel aortic branching pattern. RIGHT CAROTID SYSTEM: There is bulky mixed density plaque of the distal common carotid artery and proximal internal carotid artery. This causes less than 50% stenosis. No dissection or other acute abnormality. LEFT CAROTID SYSTEM: Mild calcific atherosclerosis of the common carotid artery. Mixed density plaque of the proximal ICA with less than 50% stenosis. VERTEBRAL ARTERIES: Left dominant configuration. There is no dissection, occlusion or flow-limiting stenosis to the skull base (V1-V3 segments). CTA HEAD FINDINGS POSTERIOR CIRCULATION: --Vertebral arteries: Atherosclerotic calcification of the left V4 segment without stenosis. --Inferior cerebellar arteries: Normal. --Basilar artery: Normal. --Superior cerebellar arteries: Normal. --Posterior cerebral arteries (PCA): Normal. ANTERIOR CIRCULATION: --Intracranial internal carotid arteries: Atherosclerotic calcification of the internal carotid arteries at the skull base without hemodynamically significant stenosis. --Anterior cerebral arteries (ACA): Normal. --Middle cerebral arteries (MCA): Normal. VENOUS SINUSES: As permitted by contrast timing, patent. ANATOMIC VARIANTS: None Review of the MIP images confirms the above findings. IMPRESSION: 1. No emergent large vessel occlusion or high-grade stenosis of the intracranial arteries. 2. Bulky mixed density plaque of the distal common and proximal internal carotid arteries with less than 50% stenosis. 3. Clustered right upper lobe pulmonary nodules measuring up to 8 mm. Nonemergent  chest CT recommended for complete visualization and to guide follow-up recommendations. Aortic Atherosclerosis (ICD10-I70.0). Electronically Signed   By: Deatra Robinson M.D.   On: 04/24/2023 23:29   DG Chest Portable 1 View  Result Date: 04/24/2023 CLINICAL DATA:  Altered mental status EXAM: PORTABLE CHEST 1 VIEW COMPARISON:  11/03/2022 FINDINGS: Cardiac shadow is stable. Aortic calcifications are seen. The lungs are well aerated bilaterally. No focal infiltrate or effusion is seen. No bony abnormality is noted. IMPRESSION: No active disease. Electronically Signed   By: Alcide Clever M.D.   On: 04/24/2023 23:21     Discharge Exam: Vitals:   04/26/23 0228 04/26/23 1008  BP: (!) 115/55 (!) 149/69  Pulse: 66 75  Resp:    Temp:    SpO2: 100%    Vitals:   04/25/23 0319 04/25/23 1241 04/26/23 0228 04/26/23 1008  BP: (!) 174/100 133/65 (!) 115/55 (!) 149/69  Pulse: 68 73 66  75  Resp: 17 18    Temp: 97.9 F (36.6 C) 97.9 F (36.6 C)    TempSrc: Oral Oral    SpO2: 99% 98% 100%   Weight: 63 kg     Height:        General: Pt is alert, awake, not in acute distress Cardiovascular: RRR, S1/S2 +, no rubs, no gallops Respiratory: CTA bilaterally, no wheezing, no rhonchi Abdominal: Soft, NT, ND, bowel sounds + Extremities: no edema, no cyanosis    The results of significant diagnostics from this hospitalization (including imaging, microbiology, ancillary and laboratory) are listed below for reference.     Microbiology: Recent Results (from the past 240 hour(s))  Urine Culture     Status: Abnormal (Preliminary result)   Collection Time: 04/24/23 10:46 PM   Specimen: Urine, Catheterized  Result Value Ref Range Status   Specimen Description   Final    URINE, CATHETERIZED Performed at [facility], [address], [place], [place] [address]    Special Requests   Final    NONE Performed at [facility], [address], [place], [place] [address]     Culture (A)  Final    >=100,000 COLONIES/mL GRAM NEGATIVE RODS CULTURE REINCUBATED FOR BETTER GROWTH Performed at [facility], [address] [place], [place] [address]    Report Status PENDING  Incomplete  Culture, blood (routine x 2)     Status: None (Preliminary result)   Collection Time: 04/25/23 12:04 AM   Specimen: BLOOD  Result Value Ref Range Status   Specimen Description   Final    BLOOD Blood Culture adequate volume Performed at [facility], [address] [place], [place] [address]    Special Requests   Final    BOTTLES DRAWN AEROBIC AND ANAEROBIC LEFT ANTECUBITAL Performed at [facility], [address], [place], [place] [address]    Culture   Final    NO GROWTH 1 DAY Performed at [facility], [address], [place], [place] [address]    Report Status PENDING  Incomplete  Culture, blood (routine x 2)     Status: None (Preliminary result)   Collection Time: 04/25/23 12:28 AM   Specimen: BLOOD  Result Value Ref Range Status   Specimen Description   Final    BLOOD RIGHT ANTECUBITAL Performed at [facility], [address] [place], [place] [address]    Special Requests   Final    BOTTLES DRAWN AEROBIC AND ANAEROBIC Blood Culture adequate volume Performed at [facility], [address], [place], [place] [address]    Culture   Final    NO GROWTH 1 DAY Performed at [facility], [address], [place], [place] [address]    Report Status PENDING  Incomplete     Labs: BNP (last 3 results) No results for input(s): "BNP" in the last 8760 hours. Basic Metabolic Panel: Recent Labs  Lab 04/24/23 2025 04/25/23 0539  NA 136 137  K 4.2 3.9  CL 99 100  CO2 26 26  GLUCOSE 94 93  BUN 29* 25*  CREATININE 1.02 0.91  CALCIUM 10.3 10.1   Liver Function Tests: Recent Labs  Lab 04/24/23 2025 04/25/23 0539  AST 30 26  ALT 21 19  ALKPHOS 97 88  BILITOT 1.0 1.1   PROT 7.5 7.1  ALBUMIN 4.2 3.8   No results for input(s): "LIPASE", "AMYLASE" in the last 168  hours. No results for input(s): "AMMONIA" in the last 168 hours. CBC: Recent Labs  Lab 04/24/23 2025 04/25/23 0539  WBC 7.9 8.7  HGB 13.3 13.2  HCT 40.6 40.2  MCV 109.4* 109.5*  PLT 155 149*   Cardiac Enzymes: No results for input(s): "CKTOTAL", "CKMB", "CKMBINDEX", "TROPONINI" in the last 168 hours. BNP: Invalid input(s): "POCBNP" CBG: Recent Labs  Lab 04/24/23 2023  GLUCAP 97   D-Dimer No results for input(s): "DDIMER" in the last 72 hours. Hgb A1c No results for input(s): "HGBA1C" in the last 72 hours. Lipid Profile No results for input(s): "CHOL", "HDL", "LDLCALC", "TRIG", "CHOLHDL", "LDLDIRECT" in the last 72 hours. Thyroid function studies No results for input(s): "TSH", "T4TOTAL", "T3FREE", "THYROIDAB" in the last 72 hours.  Invalid input(s): "FREET3" Anemia work up No results for input(s): "VITAMINB12", "FOLATE", "FERRITIN", "TIBC", "IRON", "RETICCTPCT" in the last 72 hours. Urinalysis    Component Value Date/Time   COLORURINE YELLOW 04/24/2023 2246   APPEARANCEUR HAZY (A) 04/24/2023 2246   LABSPEC 1.014 04/24/2023 2246   PHURINE 6.0 04/24/2023 2246   GLUCOSEU NEGATIVE 04/24/2023 2246   HGBUR MODERATE (A) 04/24/2023 2246   BILIRUBINUR NEGATIVE 04/24/2023 2246   KETONESUR NEGATIVE 04/24/2023 2246   PROTEINUR 30 (A) 04/24/2023 2246   NITRITE POSITIVE (A) 04/24/2023 2246   LEUKOCYTESUR LARGE (A) 04/24/2023 2246   Sepsis Labs Recent Labs  Lab 04/24/23 2025 04/25/23 0539  WBC 7.9 8.7   Microbiology Recent Results (from the past 240 hour(s))  Urine Culture     Status: Abnormal (Preliminary result)   Collection Time: 04/24/23 10:46 PM   Specimen: Urine, Catheterized  Result Value Ref Range Status   Specimen Description   Final    URINE, CATHETERIZED Performed at [facility], [address], [place], [place] [address]    Special  Requests   Final    NONE Performed at [facility], [address] [place], [place] [address]    Culture (A)  Final    >=100,000 COLONIES/mL GRAM NEGATIVE RODS CULTURE REINCUBATED FOR BETTER GROWTH Performed at [facility], [address], [place], [place] [address]    Report Status PENDING  Incomplete  Culture, blood (routine x 2)     Status: None (Preliminary result)   Collection Time: 04/25/23 12:04 AM   Specimen: BLOOD  Result Value Ref Range Status   Specimen Description   Final    BLOOD Blood Culture adequate volume Performed at [facility], [address] [place], [place] [address]    Special Requests   Final    BOTTLES DRAWN AEROBIC AND ANAEROBIC LEFT ANTECUBITAL Performed at [facility], [address] [place], [place] [address]    Culture   Final    NO GROWTH 1 DAY Performed at [facility], [address] [place], [place] [address]    Report Status PENDING  Incomplete  Culture, blood (routine x 2)     Status: None (Preliminary result)   Collection Time: 04/25/23 12:28 AM   Specimen: BLOOD  Result Value Ref Range Status   Specimen Description   Final    BLOOD RIGHT ANTECUBITAL Performed at [facility], [address], [place], [place] [address]    Special Requests   Final    BOTTLES DRAWN AEROBIC AND ANAEROBIC Blood Culture adequate volume Performed at [facility], [address], [place], [place] [address]    Culture   Final  NO GROWTH 1 DAY Performed at [facility], [address] [place], [place] [address]    Report Status PENDING  Incomplete    FURTHER DISCHARGE INSTRUCTIONS:   Get Medicines reviewed and adjusted: Please take all your medications with you for your next visit with your Primary MD   Laboratory/radiological data: Please request your Primary MD to go over all hospital tests and procedure/radiological results at the  follow up, please ask your Primary MD to get all Hospital records sent to his/her office.   In some cases, they will be blood work, cultures and biopsy results pending at the time of your discharge. Please request that your primary care M.D. goes through all the records of your hospital data and follows up on these results.   Also Note the following: If you experience worsening of your admission symptoms, develop shortness of breath, life threatening emergency, suicidal or homicidal thoughts you must seek medical attention immediately by calling 911 or calling your MD immediately  if symptoms less severe.   You must read complete instructions/literature along with all the possible adverse reactions/side effects for all the Medicines you take and that have been prescribed to you. Take any new Medicines after you have completely understood and accpet all the possible adverse reactions/side effects.    Do not drive when taking Pain medications or sleeping medications (Benzodaizepines)   Do not take more than prescribed Pain, Sleep and Anxiety Medications. It is not advisable to combine anxiety,sleep and pain medications without talking with your primary care practitioner   Special Instructions: If you have smoked or chewed Tobacco  in the last 2 yrs please stop smoking, stop any regular Alcohol  and or any Recreational drug use.   Wear Seat belts while driving.   Please note: You were cared for by a hospitalist during your hospital stay. Once you are discharged, your primary care physician will handle any further medical issues. Please note that NO REFILLS for any discharge medications will be authorized once you are discharged, as it is imperative that you return to your primary care physician (or establish a relationship with a primary care physician if you do not have one) for your post hospital discharge needs so that they can reassess your need for medications and monitor your lab values  Time  coordinating discharge: Over 30 minutes  SIGNED:   Hughie Closs, MD  [facility] 04/26/2023, 10:57 AM *Please note that this is a verbal dictation therefore any spelling or grammatical errors are due to the "[employer] One" system interpretation. If 7PM-7AM, please contact night-coverage [email or web address]

## 2023-04-26 NOTE — Progress Notes (Signed)
SLP Cancellation Note  Patient Details Name: Craig Snow. MRN: [id number] DOB: [birth year]   Cancelled treatment:       Reason Eval/Treat Not Completed: Medical issues which prohibited therapy. Per RN, patient with bradycardia, currently being addressed. Told he hold evaluation.   Ferdinand Lango MA, CCC-SLP    Nikayla Madaris Meryl 04/26/2023, 11:44 AM

## 2023-04-27 DIAGNOSIS — R531 Weakness: Secondary | ICD-10-CM | POA: Diagnosis not present

## 2023-04-27 LAB — BASIC METABOLIC PANEL
Anion gap: 18 — ABNORMAL HIGH (ref 5–15)
BUN: 26 mg/dL — ABNORMAL HIGH (ref 8–23)
CO2: 17 mmol/L — ABNORMAL LOW (ref 22–32)
Calcium: 9.7 mg/dL (ref 8.9–10.3)
Chloride: 108 mmol/L (ref 98–111)
Creatinine, Ser: 1.04 mg/dL (ref 0.61–1.24)
GFR, Estimated: >60 mL/min (ref >60)
Glucose, Bld: 30 mg/dL — CL (ref 70–99)
Potassium: 3.9 mmol/L (ref 3.5–5.1)
Sodium: 143 mmol/L (ref 135–145)

## 2023-04-27 LAB — CBC WITH DIFFERENTIAL/PLATELET
Abs Immature Granulocytes: 0.04 10*3/uL (ref 0.00–0.07)
Basophils Absolute: 0 10*3/uL (ref 0.0–0.1)
Basophils Relative: 0 %
Eosinophils Absolute: 0 10*3/uL (ref 0.0–0.5)
Eosinophils Relative: 0 %
HCT: 38.9 % — ABNORMAL LOW (ref 39.0–52.0)
Hemoglobin: 12.5 g/dL — ABNORMAL LOW (ref 13.0–17.0)
Immature Granulocytes: 1 %
Lymphocytes Relative: 22 %
Lymphs Abs: 1.8 10*3/uL (ref 0.7–4.0)
MCH: 36.1 pg — ABNORMAL HIGH (ref 26.0–34.0)
MCHC: 32.1 g/dL (ref 30.0–36.0)
MCV: 112.4 fL — ABNORMAL HIGH (ref 80.0–100.0)
Monocytes Absolute: 0.7 10*3/uL (ref 0.1–1.0)
Monocytes Relative: 8 %
Neutro Abs: 5.8 10*3/uL (ref 1.7–7.7)
Neutrophils Relative %: 69 %
Platelets: 156 10*3/uL (ref 150–400)
RBC: 3.46 MIL/uL — ABNORMAL LOW (ref 4.22–5.81)
RDW: 13.4 % (ref 11.5–15.5)
WBC: 8.3 10*3/uL (ref 4.0–10.5)
nRBC: 0 % (ref 0.0–0.2)

## 2023-04-27 LAB — GLUCOSE, CAPILLARY
Glucose-Capillary: 149 mg/dL — ABNORMAL HIGH (ref 70–99)
Glucose-Capillary: 24 mg/dL — CL (ref 70–99)
Glucose-Capillary: 84 mg/dL (ref 70–99)
Glucose-Capillary: 274 mg/dL — ABNORMAL HIGH (ref 70–99)

## 2023-04-27 LAB — TSH: TSH: 1.095 u[IU]/mL (ref 0.350–4.500)

## 2023-04-27 MED ORDER — SODIUM CHLORIDE 0.9 % IV SOLN
1.0000 g | INTRAVENOUS | Status: DC
  Administered 2023-04-27: 1 g via INTRAVENOUS
  Filled 2023-04-27 (×2): qty 10

## 2023-04-27 MED ORDER — DEXTROSE 50 % IV SOLN
INTRAVENOUS | Status: AC
  Administered 2023-04-27: 50 mL
  Filled 2023-04-27: qty 50

## 2023-04-27 MED ORDER — HALOPERIDOL LACTATE 5 MG/ML IJ SOLN
5.0000 mg | Freq: Four times a day (QID) | INTRAMUSCULAR | Status: DC | PRN
  Administered 2023-04-27 (×2): 5 mg via INTRAMUSCULAR
  Filled 2023-04-27 (×2): qty 1

## 2023-04-27 MED ORDER — CEPHALEXIN 500 MG PO CAPS: 500.0000 mg | ORAL_CAPSULE | Freq: Three times a day (TID) | ORAL | 0 refills | Status: AC

## 2023-04-27 MED ORDER — DEXTROSE 50 % IV SOLN: 25.0000 g | INTRAVENOUS | Status: AC

## 2023-04-27 NOTE — Progress Notes (Signed)
Patient at this time is calmer with decreased agitation however Patient becomes quickly upset with any care tried

## 2023-04-27 NOTE — Progress Notes (Signed)
Per new orders Haldol IM given. Patient remains very restless and paranoid.

## 2023-04-27 NOTE — Plan of Care (Signed)
  Problem: Activity: Goal: Risk for activity intolerance will decrease Outcome: Progressing   Problem: Safety: Goal: Ability to remain free from injury will improve Outcome: Progressing   Problem: Skin Integrity: Goal: Risk for impaired skin integrity will decrease Outcome: Progressing   

## 2023-04-27 NOTE — Progress Notes (Signed)
Hypoglycemic Event  CBG: 24  Treatment: D50 50 mL (25 gm)  Symptoms: irritated   Follow-up CBG: Time:1003 CBG Result:149   Possible Reasons for Event: Inadequate meal intake  Comments/MD notified: Pahwani MD notified, Dellie Burns, RN notified    Renda Rolls

## 2023-04-27 NOTE — Discharge Summary (Signed)
Physician Discharge Summary  Craig Snow. NFA:[id number] DOB: 1921-01-18 DOA: 04/24/2023  PCP: Daisy Floro, MD  Admit date: 04/24/2023 Discharge date: 04/27/2023 30 Day Unplanned Readmission Risk Score    Flowsheet Row ED to Hosp-Admission (Current) from 04/24/2023 in [facility]LO[facility]IVE C[facility] AND UROLOGY  30 Day Unplanned Readmission Risk Score (%) 13.7 Filed at 04/26/2023 0801       This score is the patient's risk of an unplanned readmission within 30 days of being discharged (0 -100%). The score is based on dignosis, age, lab data, medications, orders, and past utilization.   Low:  0-14.9   Medium: 15-21.9   High: 22-29.9   Extreme: 30 and above          Admitted From: Home Disposition: Home  Recommendations for Outpatient Follow-up:  Follow up with PCP in 1-2 weeks Please obtain BMP/CBC in one week Please follow up with your PCP on the following pending results: Unresulted Labs (From admission, onward)     Start     Ordered   04/27/23 0944  Glucose, capillary  Once,   R        04/27/23 0944   04/27/23 0859  TSH  Add-on,   AD        04/27/23 0858              Home Health: Yes Equipment/Devices: None  Discharge Condition: Stable CODE STATUS: DNR Diet recommendation: Previous home diet  Subjective: Patient seen and examined.  Patient appears to be slightly confused.  Son at the bedside.  Very lengthy discussion with the son.  Son was frustrated because patient did not receive any IV fluids.  I had explained to him the rationale that since patient was alert and oriented and was able to take p.o. intake.  No signs of dehydration, thus he was not given an IV fluids.  His BMP today is within normal limits indicating that he is not dehydrated.  Son continued to argue that patient does not take enough p.o.  And remained argumentative and upset despite of explaining rationale multiple times.  Although patient is confused and delirious.  Son himself  appears to be very much aware and familiar as well as educated about hospital-acquired delirium.  He does agree that patient is going through hospital-acquired delirium and that patient has better chances of getting better at home with family environment.  I have started him on Seroquel last night.  Which was given at 8 PM, right when I had asked the nurses to give the medications.  The son is convinced that patient needs more than 25 mg and may be more than once a night only because, according to son, his aunt who is younger and healthier than the patient was started on higher dose and twice daily.  He continued to argue with me on the dosage as well.  I had to spend a lot of time explaining on that as well that patient being 89 year old and never given Seroquel, it was wise to start him on the low-dose and once daily.  I recommended against using Seroquel during the daytime at this point since this may cause drowsiness and may cause falls.  I recommended that he takes Seroquel at night scheduled at 8 PM and advised the son to monitor results and recovery from delirium.  If this medication suits him then PCP can further prescribe him.  I have prescribed him 15 days of medications.  I also strongly advised him  to give him the medication at 8 PM.  The son continue to argue that he would rather give him at 6 PM with other medications and right before the dinner.  I advised him that patient is not supposed to fall asleep right after eating dinner as that may cause GERD and may end up causing aspiration pneumonia.  RN and sitter witnessed the conversation.   Brief/Interim Summary: Craig Tomer. is a 89 y.o. male with medical history significant of atrial fibrillation, GERD, hyperlipidemia, essential hypertension, independent Foley catheter, history of bladder cancer and BPH who presented to the ER with his son but he lives with with a complaint of generalized weakness and aphasia.  Initially CVA suspected.  So far  head CT without contrast and CTA were negative.  Urinalysis however shows evidence of UTI.  MRI head negative for stroke.  Patient's aphasia resolved.  Further details below.   Aphasia/generalized weakness secondary to UTI: Stroke ruled out with MRI head negative.  May have TIA.  UA highly consistent with UTI.  Patient on Rocephin.  Culture still cooking.  Patient is allergic to sulfa drugs and thus I am discharging this patient on Macrobid for 7 days.   Paroxysmal atrial fibrillation: Controlled.  Continue metoprolol.  Per reports and I confirmed this with the son, patient is not taking warfarin by his own choice.   Delirium: Details above.   Acquired hypothyroidism: Continue home medications.   Essential hypertension: Continue metoprolol.   Hyperlipidemia: Atorvastatin.   History of bladder cancer/BPH: Has chronic indwelling Foley catheter.  Continue Flomax.  Addendum: After discharge was completed.  I was informed that patient had an episode of bradycardia with rates in 30s with atrial fibrillation for about 30 minutes.  Patient was sleeping without any symptoms.  Son was at the bedside.  Blood pressure was also low 87/49.  1 L of IV fluid bolus was ordered.  Patient's blood pressure improved to 122/70.  I revisited patient at the bedside around 1 PM.  Discussed with the son.  We decided that we will let patient sleep as long as he wants to as he had not slept in a few days.  His heart rate had improved to 68 when I visited.  Due to sleep and restlessness overnight, he has not had much of the intake so we decided to give him another 1 L of normal saline fluid over 4 hours and the plan mutually agreed upon was to take him home later today when he is awake.  Family still believes that patient's delirium will get better at home and prefers to take him home.  Patient's daughter-in-law also showed up during my second visit.  Addendum 04/27/2023: Patient ended up staying overnight yesterday since he  slept all day yesterday and all night long to the point that he did not take any of the medications, not on Seroquel.  Fortunately, he slept all night long as well.  This morning he is fully alert and of course oriented to himself only, back to his baseline.  Daughter-in-law at the bedside.  They are happy that he is feeling better.  They are in agreement with discharge today.  Patient did have 2-3 episodes of bradycardia yesterday while he was sleeping.  He was asymptomatic and heart rate improved to 60s.  He is on metoprolol.  Discussed in length with the family.  Plan is to discontinue metoprolol and watching for bradycardia at home, especially sleeping.  If he continues to have recurrent  bradycardia, especially during sleeping, he may have sleep apnea.  Ideally he should get sleep study done as an outpatient if that happens however patient with dementia and 89 year old is likely not going to tolerate BiPAP at night.  Also, in the meantime and last 12 to 14 hours, his urine culture is now growing Klebsiella oxytoca.  Discussed with pharmacy, Macrobid will not cover that.  We will discontinue Macrobid and send him home on Keflex for 7 days.  Patient's Foley catheter was exchanged in the ED.  Patient is being discharged home in stable condition today.  Discharge plan was discussed with patient and/or family member and they verbalized understanding and agreed with it.  Discharge Diagnoses:  Principal Problem:   General weakness Active Problems:   Atrial fibrillation (HCC)   UTI (urinary tract infection)   History of bladder cancer   HLD (hyperlipidemia)   HTN (hypertension)   Hypothyroidism    Discharge Instructions   Allergies as of 04/27/2023       Reactions   Aspirin Other (See Comments)   Unknown reaction - listed on [facility] 07/30/21   Ibuprofen Anaphylaxis   Clindamycin/lincomycin Other (See Comments)   Unknown reaction - listed on [facility] 07/30/21   Other Other (See Comments)   Unknown  reaction to opioids - listed on [facility] 07/30/21 hallucinations   Sulfa Antibiotics Other (See Comments)   Unknown reaction - listed on [facility] 07/30/21   Tramadol Other (See Comments)   Hallucinations/ Currently taking for severe pain        Medication List     STOP taking these medications    ciprofloxacin 250 MG tablet Commonly known as: CIPRO   ferrous sulfate 325 (65 FE) MG tablet   pantoprazole 40 MG tablet Commonly known as: PROTONIX   PRESCRIPTION MEDICATION   Vitamin D (Ergocalciferol) 1.25 MG (50000 UNIT) Caps capsule Commonly known as: DRISDOL   warfarin 2 MG tablet Commonly known as: COUMADIN   warfarin 3 MG tablet Commonly known as: COUMADIN       TAKE these medications    acetaminophen 650 MG CR tablet Commonly known as: TYLENOL Take 650-1,300 mg by mouth every 8 (eight) hours as needed for pain.   atorvastatin 10 MG tablet Commonly known as: LIPITOR Take 10 mg by mouth at bedtime.   cephALEXin 500 MG capsule Commonly known as: KEFLEX Take 1 capsule (500 mg total) by mouth 3 (three) times daily for 7 days.   furosemide 20 MG tablet Commonly known as: LASIX Take 20 mg by mouth at bedtime.   levothyroxine 50 MCG tablet Commonly known as: SYNTHROID Take 50 mcg by mouth every morning.   liothyronine 5 MCG tablet Commonly known as: CYTOMEL Take 5 mcg by mouth every morning.   Melatonin 5 MG Caps Take 10 mg by mouth at bedtime.   metoprolol tartrate 25 MG tablet Commonly known as: LOPRESSOR Take 1 tablet (25 mg total) by mouth 2 (two) times daily.   mirtazapine 15 MG tablet Commonly known as: REMERON Take 15 mg by mouth at bedtime.   potassium chloride 10 MEQ tablet Commonly known as: KLOR-CON Take 10 mEq by mouth daily.   QUEtiapine 25 MG tablet Commonly known as: SEROQUEL Take 1 tablet (25 mg total) by mouth at bedtime for 15 days.   traMADol 50 MG tablet Commonly known as: ULTRAM Take 25 mg by mouth daily as needed for severe  pain.         Follow-up Information  Daisy Floro, MD Follow up in 1 week(s).   Specialty: Family Medicine Contact information: [address] [place] [address] [phone]         [facility]. Call.   Why: continue outpatient physical therapy Contact information: [address] [place] [address] [place] [address] [phone]               Allergies  Allergen Reactions   Aspirin Other (See Comments)    Unknown reaction - listed on [facility] 07/30/21   Ibuprofen Anaphylaxis   Clindamycin/Lincomycin Other (See Comments)    Unknown reaction - listed on [facility] 07/30/21   Other Other (See Comments)    Unknown reaction to opioids - listed on [facility] 07/30/21 hallucinations   Sulfa Antibiotics Other (See Comments)    Unknown reaction - listed on [facility] 07/30/21   Tramadol Other (See Comments)    Hallucinations/ Currently taking for severe pain    Consultations: None   Procedures/Studies: MR BRAIN WO CONTRAST  Result Date: 04/25/2023 CLINICAL DATA:  Altered mental status with slurred speech and weakness. EXAM: MRI HEAD WITHOUT CONTRAST TECHNIQUE: Multiplanar, multiecho pulse sequences of the brain and surrounding structures were obtained without intravenous contrast. COMPARISON:  None Available. FINDINGS: Brain: No acute infarct, mass effect or extra-axial collection. No acute or chronic hemorrhage. There is multifocal hyperintense T2-weighted signal within the white matter. Generalized volume loss. The midline structures are normal. Punctate focus of diffusion restriction of the right choroid plexus, possibly due to xanthogranulomatous change. Vascular: Normal flow voids. Skull and upper cervical spine: No acute finding Sinuses/Orbits:Left maxillary sinus postsurgical change. No acute abnormality. Left mastoid fluid. IMPRESSION: 1. No acute intracranial abnormality. 2. Findings of chronic small vessel ischemia and generalized volume  loss. Electronically Signed   By: Deatra Robinson M.D.   On: 04/25/2023 02:43   CT ANGIO HEAD NECK W WO CM  Result Date: 04/24/2023 CLINICAL DATA:  Left leg weakness and expressive aphasia EXAM: CT HEAD WITHOUT CONTRAST CT ANGIOGRAPHY OF THE HEAD AND NECK TECHNIQUE: Contiguous axial images were obtained from the base of the skull through the vertex without intravenous contrast. Multidetector CT imaging of the head and neck was performed using the standard protocol during bolus administration of intravenous contrast. Multiplanar CT image reconstructions and MIPs were obtained to evaluate the vascular anatomy. Carotid stenosis measurements (when applicable) are obtained utilizing NASCET criteria, using the distal internal carotid diameter as the denominator. RADIATION DOSE REDUCTION: This exam was performed according to the departmental dose-optimization program which includes automated exposure control, adjustment of the mA and/or kV according to patient size and/or use of iterative reconstruction technique. CONTRAST:  75mL OMNIPAQUE IOHEXOL 350 MG/ML SOLN COMPARISON:  Head CT 04/24/2023 FINDINGS: CT HEAD FINDINGS Brain: There is no mass, hemorrhage or extra-axial collection. The size and configuration of the ventricles and extra-axial CSF spaces are normal. There is hypoattenuation of the white matter, most commonly indicating chronic small vessel disease. Skull: The visualized skull base, calvarium and extracranial soft tissues are normal. Sinuses/Orbits: Postsurgical changes of the left maxillary sinus with moderate mucosal thickening. Moderate left sphenoid sinus mucosal thickening. Small amount of fluid in left mastoid. Ocular lens replacements. CTA NECK FINDINGS SKELETON: No acute abnormality or high grade bony spinal canal stenosis. OTHER NECK: Normal pharynx, larynx and major salivary glands. No cervical lymphadenopathy. Unremarkable thyroid gland. UPPER CHEST: Clustered right upper lobe pulmonary nodules  measuring up to 8 mm. Left apical scarring. AORTIC ARCH: There is calcific atherosclerosis of  the aortic arch. Conventional 3 vessel aortic branching pattern. RIGHT CAROTID SYSTEM: There is bulky mixed density plaque of the distal common carotid artery and proximal internal carotid artery. This causes less than 50% stenosis. No dissection or other acute abnormality. LEFT CAROTID SYSTEM: Mild calcific atherosclerosis of the common carotid artery. Mixed density plaque of the proximal ICA with less than 50% stenosis. VERTEBRAL ARTERIES: Left dominant configuration. There is no dissection, occlusion or flow-limiting stenosis to the skull base (V1-V3 segments). CTA HEAD FINDINGS POSTERIOR CIRCULATION: --Vertebral arteries: Atherosclerotic calcification of the left V4 segment without stenosis. --Inferior cerebellar arteries: Normal. --Basilar artery: Normal. --Superior cerebellar arteries: Normal. --Posterior cerebral arteries (PCA): Normal. ANTERIOR CIRCULATION: --Intracranial internal carotid arteries: Atherosclerotic calcification of the internal carotid arteries at the skull base without hemodynamically significant stenosis. --Anterior cerebral arteries (ACA): Normal. --Middle cerebral arteries (MCA): Normal. VENOUS SINUSES: As permitted by contrast timing, patent. ANATOMIC VARIANTS: None Review of the MIP images confirms the above findings. IMPRESSION: 1. No emergent large vessel occlusion or high-grade stenosis of the intracranial arteries. 2. Bulky mixed density plaque of the distal common and proximal internal carotid arteries with less than 50% stenosis. 3. Clustered right upper lobe pulmonary nodules measuring up to 8 mm. Nonemergent chest CT recommended for complete visualization and to guide follow-up recommendations. Aortic Atherosclerosis (ICD10-I70.0). Electronically Signed   By: Deatra Robinson M.D.   On: 04/24/2023 23:29   CT Head Wo Contrast  Result Date: 04/24/2023 CLINICAL DATA:  Left leg weakness  and expressive aphasia EXAM: CT HEAD WITHOUT CONTRAST CT ANGIOGRAPHY OF THE HEAD AND NECK TECHNIQUE: Contiguous axial images were obtained from the base of the skull through the vertex without intravenous contrast. Multidetector CT imaging of the head and neck was performed using the standard protocol during bolus administration of intravenous contrast. Multiplanar CT image reconstructions and MIPs were obtained to evaluate the vascular anatomy. Carotid stenosis measurements (when applicable) are obtained utilizing NASCET criteria, using the distal internal carotid diameter as the denominator. RADIATION DOSE REDUCTION: This exam was performed according to the departmental dose-optimization program which includes automated exposure control, adjustment of the mA and/or kV according to patient size and/or use of iterative reconstruction technique. CONTRAST:  75mL OMNIPAQUE IOHEXOL 350 MG/ML SOLN COMPARISON:  Head CT 04/24/2023 FINDINGS: CT HEAD FINDINGS Brain: There is no mass, hemorrhage or extra-axial collection. The size and configuration of the ventricles and extra-axial CSF spaces are normal. There is hypoattenuation of the white matter, most commonly indicating chronic small vessel disease. Skull: The visualized skull base, calvarium and extracranial soft tissues are normal. Sinuses/Orbits: Postsurgical changes of the left maxillary sinus with moderate mucosal thickening. Moderate left sphenoid sinus mucosal thickening. Small amount of fluid in left mastoid. Ocular lens replacements. CTA NECK FINDINGS SKELETON: No acute abnormality or high grade bony spinal canal stenosis. OTHER NECK: Normal pharynx, larynx and major salivary glands. No cervical lymphadenopathy. Unremarkable thyroid gland. UPPER CHEST: Clustered right upper lobe pulmonary nodules measuring up to 8 mm. Left apical scarring. AORTIC ARCH: There is calcific atherosclerosis of the aortic arch. Conventional 3 vessel aortic branching pattern. RIGHT  CAROTID SYSTEM: There is bulky mixed density plaque of the distal common carotid artery and proximal internal carotid artery. This causes less than 50% stenosis. No dissection or other acute abnormality. LEFT CAROTID SYSTEM: Mild calcific atherosclerosis of the common carotid artery. Mixed density plaque of the proximal ICA with less than 50% stenosis. VERTEBRAL ARTERIES: Left dominant configuration. There is no dissection, occlusion or flow-limiting stenosis to  the skull base (V1-V3 segments). CTA HEAD FINDINGS POSTERIOR CIRCULATION: --Vertebral arteries: Atherosclerotic calcification of the left V4 segment without stenosis. --Inferior cerebellar arteries: Normal. --Basilar artery: Normal. --Superior cerebellar arteries: Normal. --Posterior cerebral arteries (PCA): Normal. ANTERIOR CIRCULATION: --Intracranial internal carotid arteries: Atherosclerotic calcification of the internal carotid arteries at the skull base without hemodynamically significant stenosis. --Anterior cerebral arteries (ACA): Normal. --Middle cerebral arteries (MCA): Normal. VENOUS SINUSES: As permitted by contrast timing, patent. ANATOMIC VARIANTS: None Review of the MIP images confirms the above findings. IMPRESSION: 1. No emergent large vessel occlusion or high-grade stenosis of the intracranial arteries. 2. Bulky mixed density plaque of the distal common and proximal internal carotid arteries with less than 50% stenosis. 3. Clustered right upper lobe pulmonary nodules measuring up to 8 mm. Nonemergent chest CT recommended for complete visualization and to guide follow-up recommendations. Aortic Atherosclerosis (ICD10-I70.0). Electronically Signed   By: Deatra Robinson M.D.   On: 04/24/2023 23:29   DG Chest Portable 1 View  Result Date: 04/24/2023 CLINICAL DATA:  Altered mental status EXAM: PORTABLE CHEST 1 VIEW COMPARISON:  11/03/2022 FINDINGS: Cardiac shadow is stable. Aortic calcifications are seen. The lungs are well aerated  bilaterally. No focal infiltrate or effusion is seen. No bony abnormality is noted. IMPRESSION: No active disease. Electronically Signed   By: Alcide Clever M.D.   On: 04/24/2023 23:21     Discharge Exam: Vitals:   04/26/23 1844 04/27/23 0553  BP: (!) 104/56 (!) 121/56  Pulse: 69 66  Resp:    Temp: 97.7 F (36.5 C) 97.7 F (36.5 C)  SpO2: 96% 97%   Vitals:   04/26/23 1225 04/26/23 1308 04/26/23 1844 04/27/23 0553  BP: 122/70 (!) 105/57 (!) 104/56 (!) 121/56  Pulse: 67 67 69 66  Resp: 18     Temp: 98.3 F (36.8 C)  97.7 F (36.5 C) 97.7 F (36.5 C)  TempSrc: Oral  Oral Tympanic  SpO2: 98%  96% 97%  Weight:      Height:        General: Pt is alert, awake, not in acute distress Cardiovascular: RRR, S1/S2 +, no rubs, no gallops Respiratory: CTA bilaterally, no wheezing, no rhonchi Abdominal: Soft, NT, ND, bowel sounds + Extremities: no edema, no cyanosis    The results of significant diagnostics from this hospitalization (including imaging, microbiology, ancillary and laboratory) are listed below for reference.     Microbiology: Recent Results (from the past 240 hour(s))  Urine Culture     Status: Abnormal (Preliminary result)   Collection Time: 04/24/23 10:46 PM   Specimen: Urine, Catheterized  Result Value Ref Range Status   Specimen Description   Final    URINE, CATHETERIZED Performed at [facility], [address] [place], [place] [address]    Special Requests   Final    NONE Performed at [facility], [address] [place], [place] [address]    Culture (A)  Final    >=100,000 COLONIES/mL KLEBSIELLA OXYTOCA 30,000 COLONIES/mL PSEUDOMONAS AERUGINOSA CULTURE REINCUBATED FOR BETTER GROWTH Performed at [facility], [address], [place], [place] [address]    Report Status PENDING  Incomplete  Culture, blood (routine x 2)     Status: None (Preliminary result)   Collection Time: 04/25/23 12:04 AM   Specimen:  BLOOD  Result Value Ref Range Status   Specimen Description   Final    BLOOD Blood Culture adequate volume Performed at [facility], [address]  [place], [place] [address]    Special Requests   Final    BOTTLES DRAWN AEROBIC AND ANAEROBIC LEFT ANTECUBITAL Performed at [facility] [place], [address] [place], [place] [address]    Culture   Final    NO GROWTH 2 DAYS Performed at [facility], [address] [place], [place] [address]    Report Status PENDING  Incomplete  Culture, blood (routine x 2)     Status: None (Preliminary result)   Collection Time: 04/25/23 12:28 AM   Specimen: BLOOD  Result Value Ref Range Status   Specimen Description   Final    BLOOD RIGHT ANTECUBITAL Performed at [facility], [address], [place], [place] [address]    Special Requests   Final    BOTTLES DRAWN AEROBIC AND ANAEROBIC Blood Culture adequate volume Performed at [facility], [address], [place], [place] [address]    Culture   Final    NO GROWTH 2 DAYS Performed at [facility], [address] [place], [place] [address]    Report Status PENDING  Incomplete     Labs: BNP (last 3 results) No results for input(s): "BNP" in the last 8760 hours. Basic Metabolic Panel: Recent Labs  Lab 04/24/23 2025 04/25/23 0539 04/27/23 0827  NA 136 137 143  K 4.2 3.9 3.9  CL 99 100 108  CO2 26 26 17*  GLUCOSE 94 93 30*  BUN 29* 25* 26*  CREATININE 1.02 0.91 1.04  CALCIUM 10.3 10.1 9.7   Liver Function Tests: Recent Labs  Lab 04/24/23 2025 04/25/23 0539  AST 30 26  ALT 21 19  ALKPHOS 97 88  BILITOT 1.0 1.1  PROT 7.5 7.1  ALBUMIN 4.2 3.8   No results for input(s): "LIPASE", "AMYLASE" in the last 168 hours. No results for input(s): "AMMONIA" in the last 168 hours. CBC: Recent Labs  Lab 04/24/23 2025 04/25/23 0539 04/27/23 0827  WBC 7.9 8.7 8.3  NEUTROABS  --   --  5.8  HGB 13.3  13.2 12.5*  HCT 40.6 40.2 38.9*  MCV 109.4* 109.5* 112.4*  PLT 155 149* 156   Cardiac Enzymes: No results for input(s): "CKTOTAL", "CKMB", "CKMBINDEX", "TROPONINI" in the last 168 hours. BNP: Invalid input(s): "POCBNP" CBG: Recent Labs  Lab 04/24/23 2023  GLUCAP 97   D-Dimer No results for input(s): "DDIMER" in the last 72 hours. Hgb A1c No results for input(s): "HGBA1C" in the last 72 hours. Lipid Profile No results for input(s): "CHOL", "HDL", "LDLCALC", "TRIG", "CHOLHDL", "LDLDIRECT" in the last 72 hours. Thyroid function studies No results for input(s): "TSH", "T4TOTAL", "T3FREE", "THYROIDAB" in the last 72 hours.  Invalid input(s): "FREET3" Anemia work up No results for input(s): "VITAMINB12", "FOLATE", "FERRITIN", "TIBC", "IRON", "RETICCTPCT" in the last 72 hours. Urinalysis    Component Value Date/Time   COLORURINE YELLOW 04/24/2023 2246   APPEARANCEUR HAZY (A) 04/24/2023 2246   LABSPEC 1.014 04/24/2023 2246   PHURINE 6.0 04/24/2023 2246   GLUCOSEU NEGATIVE 04/24/2023 2246   HGBUR MODERATE (A) 04/24/2023 2246   BILIRUBINUR NEGATIVE 04/24/2023 2246   KETONESUR NEGATIVE 04/24/2023 2246   PROTEINUR 30 (A) 04/24/2023 2246   NITRITE POSITIVE (A) 04/24/2023 2246   LEUKOCYTESUR LARGE (A) 04/24/2023 2246   Sepsis Labs Recent Labs  Lab 04/24/23 2025 04/25/23 0539 04/27/23 0827  WBC 7.9 8.7 8.3   Microbiology Recent Results (from the past 240 hour(s))  Urine Culture     Status: Abnormal (Preliminary  result)   Collection Time: 04/24/23 10:46 PM   Specimen: Urine, Catheterized  Result Value Ref Range Status   Specimen Description   Final    URINE, CATHETERIZED Performed at [facility], [address] [place], [place] [address]    Special Requests   Final    NONE Performed at [facility], [address] [place], [place] [address]    Culture (A)  Final    >=100,000 COLONIES/mL KLEBSIELLA OXYTOCA 30,000 COLONIES/mL  PSEUDOMONAS AERUGINOSA CULTURE REINCUBATED FOR BETTER GROWTH Performed at [facility], [address], [place], [place] [address]    Report Status PENDING  Incomplete  Culture, blood (routine x 2)     Status: None (Preliminary result)   Collection Time: 04/25/23 12:04 AM   Specimen: BLOOD  Result Value Ref Range Status   Specimen Description   Final    BLOOD Blood Culture adequate volume Performed at [facility], [address], [place], [place] [address]    Special Requests   Final    BOTTLES DRAWN AEROBIC AND ANAEROBIC LEFT ANTECUBITAL Performed at [facility], [address] [place], [place] [address]    Culture   Final    NO GROWTH 2 DAYS Performed at [facility], [address], [place], [place] [address]    Report Status PENDING  Incomplete  Culture, blood (routine x 2)     Status: None (Preliminary result)   Collection Time: 04/25/23 12:28 AM   Specimen: BLOOD  Result Value Ref Range Status   Specimen Description   Final    BLOOD RIGHT ANTECUBITAL Performed at [facility], [address] [place], [place] [address]    Special Requests   Final    BOTTLES DRAWN AEROBIC AND ANAEROBIC Blood Culture adequate volume Performed at [facility], [address] [place], [place] [address]    Culture   Final    NO GROWTH 2 DAYS Performed at [facility], [address] [place], [place] [address]    Report Status PENDING  Incomplete    FURTHER DISCHARGE INSTRUCTIONS:   Get Medicines reviewed and adjusted: Please take all your medications with you for your next visit with your Primary MD   Laboratory/radiological data: Please request your Primary MD to go over all hospital tests and procedure/radiological results at the follow up, please ask your Primary MD to get all Hospital records sent to his/her office.   In some cases, they will be blood work, cultures and biopsy  results pending at the time of your discharge. Please request that your primary care M.D. goes through all the records of your hospital data and follows up on these results.   Also Note the following: If you experience worsening of your admission symptoms, develop shortness of breath, life threatening emergency, suicidal or homicidal thoughts you must seek medical attention immediately by calling 911 or calling your MD immediately  if symptoms less severe.   You must read complete instructions/literature along with all the possible adverse reactions/side effects for all the Medicines you take and that have been prescribed to you. Take any new Medicines after you have completely understood and accpet all the possible adverse reactions/side effects.    Do not drive when taking Pain medications or sleeping medications (Benzodaizepines)   Do not take more than prescribed Pain, Sleep and Anxiety Medications. It is not advisable to combine anxiety,sleep and pain medications without talking  with your primary care practitioner   Special Instructions: If you have smoked or chewed Tobacco  in the last 2 yrs please stop smoking, stop any regular Alcohol  and or any Recreational drug use.   Wear Seat belts while driving.   Please note: You were cared for by a hospitalist during your hospital stay. Once you are discharged, your primary care physician will handle any further medical issues. Please note that NO REFILLS for any discharge medications will be authorized once you are discharged, as it is imperative that you return to your primary care physician (or establish a relationship with a primary care physician if you do not have one) for your post hospital discharge needs so that they can reassess your need for medications and monitor your lab values  Time coordinating discharge: Over 30 minutes  SIGNED:   Hughie Closs, MD  [facility] 04/27/2023, 9:47 AM *Please note that this is a verbal  dictation therefore any spelling or grammatical errors are due to the "[employer] One" system interpretation. If 7PM-7AM, please contact night-coverage [email or web address]

## 2023-04-27 NOTE — Progress Notes (Signed)
Received message from the nurse that family is not comfortable taking patient home because he is not taking anything p.o.  Went to see patient again.  This time his daughter was at the bedside who was new to me.  Daughter-in-law was also at the bedside who had agreed this morning to take him home along with the patient's son who was over the phone.  The daughter refused to take him home because patient is not able to take anything p.o. and they are concerned that he will not be able to take antibiotics and will get sick.  They further inform me that patient typically is very sharp and does not have any dementia even at the age of 70 and currently he is confused and not himself.  His blood sugar was also slightly low, likely due to poor p.o. intake.  We will closely monitor him.  Per family's request, discharge has been canceled.  We will continue IV antibiotics Rocephin and I have changed the timing to 5 PM.  Discussed with RN.

## 2023-04-27 NOTE — TOC Transition Note (Signed)
Transition of Care [facility]) - CM/SW Discharge Note   Patient Details  Name: Craig Snow. MRN: [id number] Date of Birth: 1920/10/27  [facility]) CM/SW Contact:  Lanier Clam, RN Phone Number: 04/27/2023, 11:45 AM   Clinical Narrative:  d/c home no needs see prior note.     Final next level of care: Home/Self Care Barriers to Discharge: No Barriers Identified   Patient Goals and CMS Choice CMS Medicare.gov Compare Post Acute Care list provided to:: Patient Represenative (must comment) Choice offered to / list presented to : Adult Children  Discharge Placement                         Discharge Plan and Services Additional resources added to the After Visit Summary for     Discharge Planning Services: CM Consult                                 Social Determinants of Health (SDOH) Interventions SDOH Screenings   Food Insecurity: No Food Insecurity (04/25/2023)  Housing: Low Risk  (04/25/2023)  Transportation Needs: No Transportation Needs (04/25/2023)  Utilities: Not At Risk (04/25/2023)  Tobacco Use: Medium Risk (04/24/2023)     Readmission Risk Interventions    04/15/2022    1:03 PM  Readmission Risk Prevention Plan  Transportation Screening Complete  PCP or Specialist Appt within 5-7 Days Complete  Home Care Screening Complete  Medication Review (RN CM) Complete

## 2023-04-27 NOTE — Evaluation (Signed)
SLP Cancellation Note  Patient Details Name: Craig Snow. MRN: [id number] DOB: Jun 26, 1921   Cancelled treatment:       Reason Eval/Treat Not Completed: Other (comment) (pt has been lethargic and has not been able to consume po; unable to participate in speech language at this time)  Rolena Infante, MS [facility] SLP Acute Rehab Services Office [phone]   Chales Abrahams 04/27/2023, 1:41 PM

## 2023-04-27 NOTE — Progress Notes (Signed)
Patient at this time very confused agitated and unable to reorient Patient.  Patient is not complaint with safety or care measures. MD notified.

## 2023-04-28 DIAGNOSIS — R531 Weakness: Secondary | ICD-10-CM | POA: Diagnosis not present

## 2023-04-28 NOTE — TOC Transition Note (Signed)
Transition of Care [facility]) - CM/SW Discharge Note   Patient Details  Name: Craig Snow. MRN: [id number] Date of Birth: Mar 01, 1921  Transition of [facility]) CM/SW Contact:  Lanier Clam, RN Phone Number: 04/28/2023, 2:11 PM   Clinical Narrative:d/c home. No further CM needs.       Final next level of care: Home/Self Care Barriers to Discharge: No Barriers Identified   Patient Goals and CMS Choice CMS Medicare.gov Compare Post Acute Care list provided to:: Patient Represenative (must comment) Choice offered to / list presented to : Adult Children  Discharge Placement                         Discharge Plan and Services Additional resources added to the After Visit Summary for     Discharge Planning Services: CM Consult                                 Social Determinants of Health (SDOH) Interventions SDOH Screenings   Food Insecurity: No Food Insecurity (04/25/2023)  Housing: Low Risk  (04/25/2023)  Transportation Needs: No Transportation Needs (04/25/2023)  Utilities: Not At Risk (04/25/2023)  Tobacco Use: Medium Risk (04/24/2023)     Readmission Risk Interventions    04/15/2022    1:03 PM  Readmission Risk Prevention Plan  Transportation Screening Complete  PCP or Specialist Appt within 5-7 Days Complete  Home Care Screening Complete  Medication Review (RN CM) Complete

## 2023-04-28 NOTE — Plan of Care (Signed)
  Problem: Clinical Measurements: Goal: Will remain free from infection Outcome: Progressing Goal: Diagnostic test results will improve Outcome: Progressing   Problem: Activity: Goal: Risk for activity intolerance will decrease Outcome: Progressing   Problem: Coping: Goal: Level of anxiety will decrease Outcome: Progressing   Problem: Safety: Goal: Ability to remain free from injury will improve Outcome: Progressing

## 2023-04-28 NOTE — Discharge Summary (Signed)
Physician Discharge Summary  Craig Snow. ZOX:[id number] DOB: February 02, 1921 DOA: 04/24/2023  PCP: Daisy Floro, MD  Admit date: 04/24/2023 Discharge date: 04/28/2023 30 Day Unplanned Readmission Risk Score    Flowsheet Row ED to Hosp-Admission (Current) from 04/24/2023 in [facility] FLO[facility] PROGRESSIVE CARE AND UROLOGY  30 Day Unplanned Readmission Risk Score (%) 13.7 Filed at 04/26/2023 0801       This score is the patient's risk of an unplanned readmission within 30 days of being discharged (0 -100%). The score is based on dignosis, age, lab data, medications, orders, and past utilization.   Low:  0-14.9   Medium: 15-21.9   High: 22-29.9   Extreme: 30 and above          Admitted From: Home Disposition: Home  Recommendations for Outpatient Follow-up:  Follow up with PCP in 1-2 weeks Please obtain BMP/CBC in one week Please follow up with your PCP on the following pending results: Unresulted Labs (From admission, onward)    None         Home Health: Yes Equipment/Devices: None  Discharge Condition: Stable CODE STATUS: DNR Diet recommendation: Previous home diet  Subjective: Patient seen and examined.  This morning when patient's daughter-in-law and daughter were present.  They both were very happy with the improvement.  Patient started improving in the afternoon yesterday, he ate a lot of things, slept well and this morning is alert and oriented to self and vocal, back to his baseline.  I then revisited the patient in the afternoon and this time I was able to meet patient's son Brett Canales.  He is also convinced that patient is now back to baseline.  Patient did walk with OT PT and required to assist.  Ideally he should go to SNF but family is declining SNF.  They are also declining home health.  Family is now agreeable with patient going home.  Brief/Interim Summary: Craig Snow. is a 89 y.o. male with medical history significant of atrial fibrillation, GERD,  hyperlipidemia, essential hypertension, independent Foley catheter, history of bladder cancer and BPH who presented to the ER with his son but he lives with with a complaint of generalized weakness and aphasia.  Initially CVA suspected.  So far head CT without contrast and CTA were negative.  Urinalysis however shows evidence of UTI.  MRI head negative for stroke.  Patient's aphasia resolved.  Further details below.   Aphasia/generalized weakness secondary to UTI: Stroke ruled out with MRI head negative.  May have TIA.  UA highly consistent with UTI.  Urine culture growing Klebsiella oxytoca, discharging on Keflex for 7 days.   Paroxysmal atrial fibrillation: Controlled.  Continue metoprolol.  Per reports and I confirmed this with the son, patient is not taking warfarin by his own choice.   Delirium: Resolved.  I have prescribed him scheduled Seroquel for night, lowest dose 25 mg p.o. daily.  Send has been advised in great length to observe his behaviors.  If this works for him, he will further discuss with the PCP to extend this medication and prescribe more.   Acquired hypothyroidism: Continue home medications.   Essential hypertension: Continue metoprolol.   Hyperlipidemia: Atorvastatin.   History of bladder cancer/BPH: Has chronic indwelling Foley catheter.  Continue Flomax.  Foley catheter was exchanged in the ED.  Discharge plan was discussed with patient and/or family member and they verbalized understanding and agreed with it.  Discharge Diagnoses:  Principal Problem:   General weakness Active Problems:  Atrial fibrillation (HCC)   UTI (urinary tract infection)   History of bladder cancer   HLD (hyperlipidemia)   HTN (hypertension)   Hypothyroidism    Discharge Instructions   Allergies as of 04/28/2023       Reactions   Aspirin Other (See Comments)   Unknown reaction - listed on [facility] 07/30/21   Ibuprofen Anaphylaxis   Clindamycin/lincomycin Other (See Comments)    Unknown reaction - listed on [facility] 07/30/21   Other Other (See Comments)   Unknown reaction to opioids - listed on [facility] 07/30/21 hallucinations   Sulfa Antibiotics Other (See Comments)   Unknown reaction - listed on [facility] 07/30/21   Tramadol Other (See Comments)   Hallucinations/ Currently taking for severe pain        Medication List     STOP taking these medications    ciprofloxacin 250 MG tablet Commonly known as: CIPRO   ferrous sulfate 325 (65 FE) MG tablet   pantoprazole 40 MG tablet Commonly known as: PROTONIX   PRESCRIPTION MEDICATION   Vitamin D (Ergocalciferol) 1.25 MG (50000 UNIT) Caps capsule Commonly known as: DRISDOL   warfarin 2 MG tablet Commonly known as: COUMADIN   warfarin 3 MG tablet Commonly known as: COUMADIN       TAKE these medications    acetaminophen 650 MG CR tablet Commonly known as: TYLENOL Take 650-1,300 mg by mouth every 8 (eight) hours as needed for pain.   atorvastatin 10 MG tablet Commonly known as: LIPITOR Take 10 mg by mouth at bedtime.   cephALEXin 500 MG capsule Commonly known as: KEFLEX Take 1 capsule (500 mg total) by mouth 3 (three) times daily for 7 days.   furosemide 20 MG tablet Commonly known as: LASIX Take 20 mg by mouth at bedtime.   levothyroxine 50 MCG tablet Commonly known as: SYNTHROID Take 50 mcg by mouth every morning.   liothyronine 5 MCG tablet Commonly known as: CYTOMEL Take 5 mcg by mouth every morning.   Melatonin 5 MG Caps Take 10 mg by mouth at bedtime.   metoprolol tartrate 25 MG tablet Commonly known as: LOPRESSOR Take 1 tablet (25 mg total) by mouth 2 (two) times daily.   mirtazapine 15 MG tablet Commonly known as: REMERON Take 15 mg by mouth at bedtime.   potassium chloride 10 MEQ tablet Commonly known as: KLOR-CON Take 10 mEq by mouth daily.   QUEtiapine 25 MG tablet Commonly known as: SEROQUEL Take 1 tablet (25 mg total) by mouth at bedtime for 15 days.   traMADol 50 MG  tablet Commonly known as: ULTRAM Take 25 mg by mouth daily as needed for severe pain.          Follow-up Information     Daisy Floro, MD Follow up in 1 week(s).   Specialty: Family Medicine Contact information: [address] [place] [address] [phone]         [facility]. Call.   Why: continue outpatient physical therapy Contact information: [address] [place] [address] [phone]               Allergies  Allergen Reactions   Aspirin Other (See Comments)    Unknown reaction - listed on [facility] 07/30/21   Ibuprofen Anaphylaxis   Clindamycin/Lincomycin Other (See Comments)    Unknown reaction - listed on [facility] 07/30/21   Other Other (See Comments)    Unknown reaction to opioids - listed on [facility] 07/30/21 hallucinations  Sulfa Antibiotics Other (See Comments)    Unknown reaction - listed on [facility] 07/30/21   Tramadol Other (See Comments)    Hallucinations/ Currently taking for severe pain    Consultations: None   Procedures/Studies: MR BRAIN WO CONTRAST  Result Date: 04/25/2023 CLINICAL DATA:  Altered mental status with slurred speech and weakness. EXAM: MRI HEAD WITHOUT CONTRAST TECHNIQUE: Multiplanar, multiecho pulse sequences of the brain and surrounding structures were obtained without intravenous contrast. COMPARISON:  None Available. FINDINGS: Brain: No acute infarct, mass effect or extra-axial collection. No acute or chronic hemorrhage. There is multifocal hyperintense T2-weighted signal within the white matter. Generalized volume loss. The midline structures are normal. Punctate focus of diffusion restriction of the right choroid plexus, possibly due to xanthogranulomatous change. Vascular: Normal flow voids. Skull and upper cervical spine: No acute finding Sinuses/Orbits:Left maxillary sinus postsurgical change. No acute abnormality. Left mastoid fluid. IMPRESSION: 1. No acute intracranial  abnormality. 2. Findings of chronic small vessel ischemia and generalized volume loss. Electronically Signed   By: Deatra Robinson M.D.   On: 04/25/2023 02:43   CT ANGIO HEAD NECK W WO CM  Result Date: 04/24/2023 CLINICAL DATA:  Left leg weakness and expressive aphasia EXAM: CT HEAD WITHOUT CONTRAST CT ANGIOGRAPHY OF THE HEAD AND NECK TECHNIQUE: Contiguous axial images were obtained from the base of the skull through the vertex without intravenous contrast. Multidetector CT imaging of the head and neck was performed using the standard protocol during bolus administration of intravenous contrast. Multiplanar CT image reconstructions and MIPs were obtained to evaluate the vascular anatomy. Carotid stenosis measurements (when applicable) are obtained utilizing NASCET criteria, using the distal internal carotid diameter as the denominator. RADIATION DOSE REDUCTION: This exam was performed according to the departmental dose-optimization program which includes automated exposure control, adjustment of the mA and/or kV according to patient size and/or use of iterative reconstruction technique. CONTRAST:  75mL OMNIPAQUE IOHEXOL 350 MG/ML SOLN COMPARISON:  Head CT 04/24/2023 FINDINGS: CT HEAD FINDINGS Brain: There is no mass, hemorrhage or extra-axial collection. The size and configuration of the ventricles and extra-axial CSF spaces are normal. There is hypoattenuation of the white matter, most commonly indicating chronic small vessel disease. Skull: The visualized skull base, calvarium and extracranial soft tissues are normal. Sinuses/Orbits: Postsurgical changes of the left maxillary sinus with moderate mucosal thickening. Moderate left sphenoid sinus mucosal thickening. Small amount of fluid in left mastoid. Ocular lens replacements. CTA NECK FINDINGS SKELETON: No acute abnormality or high grade bony spinal canal stenosis. OTHER NECK: Normal pharynx, larynx and major salivary glands. No cervical lymphadenopathy.  Unremarkable thyroid gland. UPPER CHEST: Clustered right upper lobe pulmonary nodules measuring up to 8 mm. Left apical scarring. AORTIC ARCH: There is calcific atherosclerosis of the aortic arch. Conventional 3 vessel aortic branching pattern. RIGHT CAROTID SYSTEM: There is bulky mixed density plaque of the distal common carotid artery and proximal internal carotid artery. This causes less than 50% stenosis. No dissection or other acute abnormality. LEFT CAROTID SYSTEM: Mild calcific atherosclerosis of the common carotid artery. Mixed density plaque of the proximal ICA with less than 50% stenosis. VERTEBRAL ARTERIES: Left dominant configuration. There is no dissection, occlusion or flow-limiting stenosis to the skull base (V1-V3 segments). CTA HEAD FINDINGS POSTERIOR CIRCULATION: --Vertebral arteries: Atherosclerotic calcification of the left V4 segment without stenosis. --Inferior cerebellar arteries: Normal. --Basilar artery: Normal. --Superior cerebellar arteries: Normal. --Posterior cerebral arteries (PCA): Normal. ANTERIOR CIRCULATION: --Intracranial internal carotid arteries: Atherosclerotic calcification of the internal carotid arteries at the skull  base without hemodynamically significant stenosis. --Anterior cerebral arteries (ACA): Normal. --Middle cerebral arteries (MCA): Normal. VENOUS SINUSES: As permitted by contrast timing, patent. ANATOMIC VARIANTS: None Review of the MIP images confirms the above findings. IMPRESSION: 1. No emergent large vessel occlusion or high-grade stenosis of the intracranial arteries. 2. Bulky mixed density plaque of the distal common and proximal internal carotid arteries with less than 50% stenosis. 3. Clustered right upper lobe pulmonary nodules measuring up to 8 mm. Nonemergent chest CT recommended for complete visualization and to guide follow-up recommendations. Aortic Atherosclerosis (ICD10-I70.0). Electronically Signed   By: Deatra Robinson M.D.   On: 04/24/2023 23:29    CT Head Wo Contrast  Result Date: 04/24/2023 CLINICAL DATA:  Left leg weakness and expressive aphasia EXAM: CT HEAD WITHOUT CONTRAST CT ANGIOGRAPHY OF THE HEAD AND NECK TECHNIQUE: Contiguous axial images were obtained from the base of the skull through the vertex without intravenous contrast. Multidetector CT imaging of the head and neck was performed using the standard protocol during bolus administration of intravenous contrast. Multiplanar CT image reconstructions and MIPs were obtained to evaluate the vascular anatomy. Carotid stenosis measurements (when applicable) are obtained utilizing NASCET criteria, using the distal internal carotid diameter as the denominator. RADIATION DOSE REDUCTION: This exam was performed according to the departmental dose-optimization program which includes automated exposure control, adjustment of the mA and/or kV according to patient size and/or use of iterative reconstruction technique. CONTRAST:  75mL OMNIPAQUE IOHEXOL 350 MG/ML SOLN COMPARISON:  Head CT 04/24/2023 FINDINGS: CT HEAD FINDINGS Brain: There is no mass, hemorrhage or extra-axial collection. The size and configuration of the ventricles and extra-axial CSF spaces are normal. There is hypoattenuation of the white matter, most commonly indicating chronic small vessel disease. Skull: The visualized skull base, calvarium and extracranial soft tissues are normal. Sinuses/Orbits: Postsurgical changes of the left maxillary sinus with moderate mucosal thickening. Moderate left sphenoid sinus mucosal thickening. Small amount of fluid in left mastoid. Ocular lens replacements. CTA NECK FINDINGS SKELETON: No acute abnormality or high grade bony spinal canal stenosis. OTHER NECK: Normal pharynx, larynx and major salivary glands. No cervical lymphadenopathy. Unremarkable thyroid gland. UPPER CHEST: Clustered right upper lobe pulmonary nodules measuring up to 8 mm. Left apical scarring. AORTIC ARCH: There is calcific  atherosclerosis of the aortic arch. Conventional 3 vessel aortic branching pattern. RIGHT CAROTID SYSTEM: There is bulky mixed density plaque of the distal common carotid artery and proximal internal carotid artery. This causes less than 50% stenosis. No dissection or other acute abnormality. LEFT CAROTID SYSTEM: Mild calcific atherosclerosis of the common carotid artery. Mixed density plaque of the proximal ICA with less than 50% stenosis. VERTEBRAL ARTERIES: Left dominant configuration. There is no dissection, occlusion or flow-limiting stenosis to the skull base (V1-V3 segments). CTA HEAD FINDINGS POSTERIOR CIRCULATION: --Vertebral arteries: Atherosclerotic calcification of the left V4 segment without stenosis. --Inferior cerebellar arteries: Normal. --Basilar artery: Normal. --Superior cerebellar arteries: Normal. --Posterior cerebral arteries (PCA): Normal. ANTERIOR CIRCULATION: --Intracranial internal carotid arteries: Atherosclerotic calcification of the internal carotid arteries at the skull base without hemodynamically significant stenosis. --Anterior cerebral arteries (ACA): Normal. --Middle cerebral arteries (MCA): Normal. VENOUS SINUSES: As permitted by contrast timing, patent. ANATOMIC VARIANTS: None Review of the MIP images confirms the above findings. IMPRESSION: 1. No emergent large vessel occlusion or high-grade stenosis of the intracranial arteries. 2. Bulky mixed density plaque of the distal common and proximal internal carotid arteries with less than 50% stenosis. 3. Clustered right upper lobe pulmonary nodules measuring up to  8 mm. Nonemergent chest CT recommended for complete visualization and to guide follow-up recommendations. Aortic Atherosclerosis (ICD10-I70.0). Electronically Signed   By: Deatra Robinson M.D.   On: 04/24/2023 23:29   DG Chest Portable 1 View  Result Date: 04/24/2023 CLINICAL DATA:  Altered mental status EXAM: PORTABLE CHEST 1 VIEW COMPARISON:  11/03/2022 FINDINGS:  Cardiac shadow is stable. Aortic calcifications are seen. The lungs are well aerated bilaterally. No focal infiltrate or effusion is seen. No bony abnormality is noted. IMPRESSION: No active disease. Electronically Signed   By: Alcide Clever M.D.   On: 04/24/2023 23:21     Discharge Exam: Vitals:   04/27/23 1928 04/28/23 0424  BP: 138/74 103/62  Pulse: 93 79  Resp: 16 16  Temp: 99.2 F (37.3 C) 99.7 F (37.6 C)  SpO2: 96% 93%   Vitals:   04/27/23 0553 04/27/23 1253 04/27/23 1928 04/28/23 0424  BP: (!) 121/56 (!) 157/83 138/74 103/62  Pulse: 66 (!) 102 93 79  Resp:   16 16  Temp: 97.7 F (36.5 C) 98.6 F (37 C) 99.2 F (37.3 C) 99.7 F (37.6 C)  TempSrc: Tympanic Oral Oral Oral  SpO2: 97%  96% 93%  Weight:      Height:        General: Pt is alert, awake, not in acute distress Cardiovascular: RRR, S1/S2 +, no rubs, no gallops Respiratory: CTA bilaterally, no wheezing, no rhonchi Abdominal: Soft, NT, ND, bowel sounds + Extremities: no edema, no cyanosis    The results of significant diagnostics from this hospitalization (including imaging, microbiology, ancillary and laboratory) are listed below for reference.     Microbiology: Recent Results (from the past 240 hour(s))  Urine Culture     Status: Abnormal (Preliminary result)   Collection Time: 04/24/23 10:46 PM   Specimen: Urine, Catheterized  Result Value Ref Range Status   Specimen Description   Final    URINE, CATHETERIZED Performed at [facility], [address], [place], [place] [address]    Special Requests   Final    NONE Performed at [facility], [address], [place], [place] [address]    Culture (A)  Final    >=100,000 COLONIES/mL KLEBSIELLA OXYTOCA 30,000 COLONIES/mL PSEUDOMONAS AERUGINOSA CONFIRMATION OF SUSCEPTIBILITIES IN PROGRESS Performed at [facility], [address] [place], [place] [address]    Report Status PENDING  Incomplete  Culture,  blood (routine x 2)     Status: None (Preliminary result)   Collection Time: 04/25/23 12:04 AM   Specimen: BLOOD  Result Value Ref Range Status   Specimen Description   Final    BLOOD Blood Culture adequate volume Performed at [facility], [address], [place], [place] [address]    Special Requests   Final    BOTTLES DRAWN AEROBIC AND ANAEROBIC LEFT ANTECUBITAL Performed at [facility], [address], [place], [place] [address]    Culture   Final    NO GROWTH 3 DAYS Performed at [facility], [address] [place], [place] [address]    Report Status PENDING  Incomplete  Culture, blood (routine x 2)     Status: None (Preliminary result)   Collection Time: 04/25/23 12:28 AM   Specimen: BLOOD  Result Value Ref Range Status   Specimen Description   Final    BLOOD RIGHT ANTECUBITAL Performed at [facility], [address], [place], [place] [address]    Special  Requests   Final    BOTTLES DRAWN AEROBIC AND ANAEROBIC Blood Culture adequate volume Performed at [facility], [address] [place], [place] [address]    Culture   Final    NO GROWTH 3 DAYS Performed at [facility], [address], [place], [place] [address]    Report Status PENDING  Incomplete     Labs: BNP (last 3 results) No results for input(s): "BNP" in the last 8760 hours. Basic Metabolic Panel: Recent Labs  Lab 04/24/23 2025 04/25/23 0539 04/27/23 0827  NA 136 137 143  K 4.2 3.9 3.9  CL 99 100 108  CO2 26 26 17*  GLUCOSE 94 93 30*  BUN 29* 25* 26*  CREATININE 1.02 0.91 1.04  CALCIUM 10.3 10.1 9.7   Liver Function Tests: Recent Labs  Lab 04/24/23 2025 04/25/23 0539  AST 30 26  ALT 21 19  ALKPHOS 97 88  BILITOT 1.0 1.1  PROT 7.5 7.1  ALBUMIN 4.2 3.8   No results for input(s): "LIPASE", "AMYLASE" in the last 168 hours. No results for input(s): "AMMONIA" in the last 168 hours. CBC: Recent Labs   Lab 04/24/23 2025 04/25/23 0539 04/27/23 0827  WBC 7.9 8.7 8.3  NEUTROABS  --   --  5.8  HGB 13.3 13.2 12.5*  HCT 40.6 40.2 38.9*  MCV 109.4* 109.5* 112.4*  PLT 155 149* 156   Cardiac Enzymes: No results for input(s): "CKTOTAL", "CKMB", "CKMBINDEX", "TROPONINI" in the last 168 hours. BNP: Invalid input(s): "POCBNP" CBG: Recent Labs  Lab 04/24/23 2023 04/27/23 0944 04/27/23 1003 04/27/23 1304 04/27/23 2100  GLUCAP 97 24* 149* 84 274*   D-Dimer No results for input(s): "DDIMER" in the last 72 hours. Hgb A1c No results for input(s): "HGBA1C" in the last 72 hours. Lipid Profile No results for input(s): "CHOL", "HDL", "LDLCALC", "TRIG", "CHOLHDL", "LDLDIRECT" in the last 72 hours. Thyroid function studies Recent Labs    04/27/23 0827  TSH 1.095   Anemia work up No results for input(s): "VITAMINB12", "FOLATE", "FERRITIN", "TIBC", "IRON", "RETICCTPCT" in the last 72 hours. Urinalysis    Component Value Date/Time   COLORURINE YELLOW 04/24/2023 2246   APPEARANCEUR HAZY (A) 04/24/2023 2246   LABSPEC 1.014 04/24/2023 2246   PHURINE 6.0 04/24/2023 2246   GLUCOSEU NEGATIVE 04/24/2023 2246   HGBUR MODERATE (A) 04/24/2023 2246   BILIRUBINUR NEGATIVE 04/24/2023 2246   KETONESUR NEGATIVE 04/24/2023 2246   PROTEINUR 30 (A) 04/24/2023 2246   NITRITE POSITIVE (A) 04/24/2023 2246   LEUKOCYTESUR LARGE (A) 04/24/2023 2246   Sepsis Labs Recent Labs  Lab 04/24/23 2025 04/25/23 0539 04/27/23 0827  WBC 7.9 8.7 8.3   Microbiology Recent Results (from the past 240 hour(s))  Urine Culture     Status: Abnormal (Preliminary result)   Collection Time: 04/24/23 10:46 PM   Specimen: Urine, Catheterized  Result Value Ref Range Status   Specimen Description   Final    URINE, CATHETERIZED Performed at [facility], [address], [place], [place] [address]    Special Requests   Final    NONE Performed at [facility], [address],  [place], [place] [address]    Culture (A)  Final    >=100,000 COLONIES/mL KLEBSIELLA OXYTOCA 30,000 COLONIES/mL PSEUDOMONAS AERUGINOSA CONFIRMATION OF SUSCEPTIBILITIES IN PROGRESS Performed at [facility], [address], [place], [place] [address]    Report Status PENDING  Incomplete  Culture, blood (routine x 2)  Status: None (Preliminary result)   Collection Time: 04/25/23 12:04 AM   Specimen: BLOOD  Result Value Ref Range Status   Specimen Description   Final    BLOOD Blood Culture adequate volume Performed at [facility], [address] [place], [place] [address]    Special Requests   Final    BOTTLES DRAWN AEROBIC AND ANAEROBIC LEFT ANTECUBITAL Performed at [facility], [address], [place], [place] [address]    Culture   Final    NO GROWTH 3 DAYS Performed at [facility], [address] [place], [place] [address]    Report Status PENDING  Incomplete  Culture, blood (routine x 2)     Status: None (Preliminary result)   Collection Time: 04/25/23 12:28 AM   Specimen: BLOOD  Result Value Ref Range Status   Specimen Description   Final    BLOOD RIGHT ANTECUBITAL Performed at [facility], [address] [place], [place] [address]    Special Requests   Final    BOTTLES DRAWN AEROBIC AND ANAEROBIC Blood Culture adequate volume Performed at [facility], [address] [place], [place] [address]    Culture   Final    NO GROWTH 3 DAYS Performed at [facility], [address] [place], [place] [address]    Report Status PENDING  Incomplete    FURTHER DISCHARGE INSTRUCTIONS:   Get Medicines reviewed and adjusted: Please take all your medications with you for your next visit with your Primary MD   Laboratory/radiological data: Please request your Primary MD to go over all hospital tests and procedure/radiological results at the follow up, please ask your Primary MD to  get all Hospital records sent to his/her office.   In some cases, they will be blood work, cultures and biopsy results pending at the time of your discharge. Please request that your primary care M.D. goes through all the records of your hospital data and follows up on these results.   Also Note the following: If you experience worsening of your admission symptoms, develop shortness of breath, life threatening emergency, suicidal or homicidal thoughts you must seek medical attention immediately by calling 911 or calling your MD immediately  if symptoms less severe.   You must read complete instructions/literature along with all the possible adverse reactions/side effects for all the Medicines you take and that have been prescribed to you. Take any new Medicines after you have completely understood and accpet all the possible adverse reactions/side effects.    Do not drive when taking Pain medications or sleeping medications (Benzodaizepines)   Do not take more than prescribed Pain, Sleep and Anxiety Medications. It is not advisable to combine anxiety,sleep and pain medications without talking with your primary care practitioner   Special Instructions: If you have smoked or chewed Tobacco  in the last 2 yrs please stop smoking, stop any regular Alcohol  and or any Recreational drug use.   Wear Seat belts while driving.   Please note: You were cared for by a hospitalist during your hospital stay. Once you are discharged, your primary care physician will handle any further medical issues. Please note that NO REFILLS for any discharge medications will be authorized once you are discharged, as it is imperative that you return to your primary care physician (or establish a relationship with a primary care physician if you do not have one) for your post hospital discharge needs so that they  can reassess your need for medications and monitor your lab values  Time coordinating discharge: Over 30  minutes  SIGNED:   Hughie Closs, MD  [facility] 04/28/2023, 1:50 PM *Please note that this is a verbal dictation therefore any spelling or grammatical errors are due to the "Dragon [employer] One" system interpretation. If 7PM-7AM, please contact night-coverage [email or web address]

## 2023-04-28 NOTE — Progress Notes (Signed)
Physical Therapy Treatment Patient Details Name: Craig Snow. MRN: [id number] DOB: [birth year] Today's Date: 04/28/2023   History of Present Illness Craig Snow. is a 89 y.o. male who presents with L leg weakness and aphasia. CTH and brain MRI: No acute intracranial abnormality. Pt admitted with UTI. PMH: urinary retention with indwelling foley, HTN, afib, mouth cancer, bladder cancer, and GERD.    PT Comments  Pt. did not tolerate treatment well secondary to dyspnea, fatigue, and general weakness. Pt's gait distance decreased from 13ft last session to 59ft. Family present with concerns about his ability to return home.    If plan is discharge home, recommend the following:     Can travel by private vehicle        Equipment Recommendations       Recommendations for Other Services       Precautions / Restrictions Precautions Precautions: Fall Precaution Comments: HOH Restrictions Weight Bearing Restrictions: No     Mobility  Bed Mobility Overal bed mobility: Needs Assistance Bed Mobility: Supine to Sit     Supine to sit: Min assist     General bed mobility comments: complicated by [facility] and, increased time and effort with min assist to come to sitting EOB. Poor, forward flexed posture    Transfers Overall transfer level: Needs assistance Equipment used: Rolling walker (2 wheels) Transfers: Sit to/from Stand Sit to Stand: Max assist, +2 physical assistance, From elevated surface           General transfer comment: Pt. demonstrated a posterior lean when attempting to stand from elevated surface, with a loss of balance back onto the bed. When standing, pt. had flexed knees, hips, and trunk. Unsteady/ high fall risk    Ambulation/Gait Ambulation/Gait assistance: Max assist, +2 physical assistance, +2 safety/equipment Gait Distance (Feet): 4 Feet Assistive device: Rolling walker (2 wheels) Gait Pattern/deviations: Step-to pattern, Decreased stride length,  Decreased step length - right, Decreased step length - left, Trunk flexed, Narrow base of support, Leaning posteriorly, Knee flexed in stance - right, Knee flexed in stance - left Gait velocity: decreased     General Gait Details: Decreased distance today. Pt. required max assist. to move the walker forward. Pt. had a posterior lean when ambulating. After ambulating about 59ft, pt. made gesture to show he could not continue walking. RA decreased to 87%, increased dyspnea. Pt. was returned to seated position in recliner.   Stairs             Wheelchair Mobility     Tilt Bed    Modified Rankin (Stroke Patients Only)       Balance                                            Cognition Arousal: Alert Behavior During Therapy: WFL for tasks assessed/performed Overall Cognitive Status: Within Functional Limits for tasks assessed                                 General Comments: HOH, requires written communication. Pt's daughter in law was present and assisted w/ treatment, by following w/ recliner.        Exercises      General Comments        Pertinent Vitals/Pain Pain Assessment Pain Assessment: No/denies pain Pain Score: 0-No pain Faces  Pain Scale: No hurt    Home Living                          Prior Function            PT Goals (current goals can now be found in the care plan section) Progress towards PT goals: Progressing toward goals    Frequency           PT Plan      Co-evaluation     PT goals addressed during session: Mobility/safety with mobility;Proper use of DME        AM-PAC PT "6 Clicks" Mobility   Outcome Measure  Help needed turning from your back to your side while in a flat bed without using bedrails?: A Lot Help needed moving from lying on your back to sitting on the side of a flat bed without using bedrails?: A Lot Help needed moving to and from a bed to a chair (including a  wheelchair)?: A Lot Help needed standing up from a chair using your arms (e.g., wheelchair or bedside chair)?: A Lot Help needed to walk in hospital room?: Total Help needed climbing 3-5 steps with a railing? : Total 6 Click Score: 10    End of Session Equipment Utilized During Treatment: Gait belt Activity Tolerance: Patient limited by fatigue;Other (comment) (dyspnea, exertion) Patient left: in chair;with call bell/phone within reach;with chair alarm set;with family/visitor present Nurse Communication: Mobility status (IV status)       Time: 0865-7846 PT Time Calculation (min) (ACUTE ONLY): 24 min  Charges:    $Gait Training: 8-22 mins $Therapeutic Activity: 8-22 mins PT General Charges $$ ACUTE PT VISIT: 1 Visit                     Lazaro Arms, SPTA 04/28/2023, 1:57 PM

## 2023-04-29 LAB — URINE CULTURE: Culture: >=100000 — AB

## 2023-04-30 LAB — CULTURE, BLOOD (ROUTINE X 2)
Culture: NO GROWTH
Culture: NO GROWTH
Special Requests: ADEQUATE
Specimen Description: ADEQUATE

## 2023-05-04 ENCOUNTER — Other Ambulatory Visit (HOSPITAL_COMMUNITY): Payer: Self-pay | Admitting: Family Medicine

## 2023-05-04 DIAGNOSIS — E639 Nutritional deficiency, unspecified: Secondary | ICD-10-CM

## 2023-05-05 ENCOUNTER — Ambulatory Visit (HOSPITAL_COMMUNITY): Admission: RE | Admit: 2023-05-05 | Payer: No Typology Code available for payment source | Source: Ambulatory Visit

## 2023-05-08 ENCOUNTER — Encounter (HOSPITAL_COMMUNITY): Payer: Self-pay

## 2023-05-08 ENCOUNTER — Ambulatory Visit (HOSPITAL_COMMUNITY): Admission: RE | Admit: 2023-05-08 | Payer: No Typology Code available for payment source | Source: Ambulatory Visit

## 2023-06-05 ENCOUNTER — Other Ambulatory Visit (HOSPITAL_COMMUNITY): Payer: Self-pay | Admitting: Family Medicine

## 2023-06-05 ENCOUNTER — Telehealth (HOSPITAL_COMMUNITY): Payer: Self-pay

## 2023-06-05 DIAGNOSIS — R1310 Dysphagia, unspecified: Secondary | ICD-10-CM

## 2023-06-05 DIAGNOSIS — E639 Nutritional deficiency, unspecified: Secondary | ICD-10-CM

## 2023-06-05 DIAGNOSIS — R627 Adult failure to thrive: Secondary | ICD-10-CM

## 2023-06-05 NOTE — Telephone Encounter (Signed)
Called to schedule peg placement, no answer, left vm. AB

## 2023-06-05 NOTE — Telephone Encounter (Signed)
-----   Message from Gilmer Mor sent at 05/31/2023  4:21 PM EST ----- Regarding: RE: 3rd request/Peg placement OK for image guided gastrostomy placement.   [address] ----- Message ----- From: Sharee Pimple Sent: 05/31/2023  10:59 AM EST To: Ir Procedure Requests Subject: 3rd request/Peg placement                      Procedure: Peg tube placement   Dx: dyphagia, nutritional deficiency, adult failure to thrive   Ordering: Dr. Duane Lope [phone]   Imaging: Most recent in Epic CT abd done 01/2022   Please advise.   Thanks,  Fara Boros

## 2023-06-20 ENCOUNTER — Other Ambulatory Visit: Payer: Self-pay

## 2023-06-20 ENCOUNTER — Emergency Department (HOSPITAL_COMMUNITY)
Admission: EM | Admit: 2023-06-20 | Discharge: 2023-06-20 | Disposition: A | Discharge status: Home / Self Care | Payer: No Typology Code available for payment source | Attending: Emergency Medicine | Admitting: Emergency Medicine

## 2023-06-20 ENCOUNTER — Encounter (HOSPITAL_COMMUNITY): Payer: Self-pay

## 2023-06-20 ENCOUNTER — Telehealth (HOSPITAL_COMMUNITY): Payer: Self-pay

## 2023-06-20 DIAGNOSIS — F0390 Unspecified dementia without behavioral disturbance: Secondary | ICD-10-CM | POA: Diagnosis not present

## 2023-06-20 DIAGNOSIS — Z20822 Contact with and (suspected) exposure to covid-19: Secondary | ICD-10-CM | POA: Diagnosis not present

## 2023-06-20 DIAGNOSIS — J189 Pneumonia, unspecified organism: Secondary | ICD-10-CM | POA: Insufficient documentation

## 2023-06-20 LAB — CBC WITH DIFFERENTIAL/PLATELET
Abs Immature Granulocytes: 0.11 10*3/uL — ABNORMAL HIGH (ref 0.00–0.07)
Basophils Absolute: 0 10*3/uL (ref 0.0–0.1)
Basophils Relative: 0 %
Eosinophils Absolute: 0 10*3/uL (ref 0.0–0.5)
Eosinophils Relative: 0 %
HCT: 37.6 % — ABNORMAL LOW (ref 39.0–52.0)
Hemoglobin: 12.6 g/dL — ABNORMAL LOW (ref 13.0–17.0)
Immature Granulocytes: 1 %
Lymphocytes Relative: 6 %
Lymphs Abs: 0.9 10*3/uL (ref 0.7–4.0)
MCH: 36.5 pg — ABNORMAL HIGH (ref 26.0–34.0)
MCHC: 33.5 g/dL (ref 30.0–36.0)
MCV: 109 fL — ABNORMAL HIGH (ref 80.0–100.0)
Monocytes Absolute: 1.2 10*3/uL — ABNORMAL HIGH (ref 0.1–1.0)
Monocytes Relative: 7 %
Neutro Abs: 13.6 10*3/uL — ABNORMAL HIGH (ref 1.7–7.7)
Neutrophils Relative %: 86 %
Platelets: 195 10*3/uL (ref 150–400)
RBC: 3.45 MIL/uL — ABNORMAL LOW (ref 4.22–5.81)
RDW: 13.4 % (ref 11.5–15.5)
WBC: 15.7 10*3/uL — ABNORMAL HIGH (ref 4.0–10.5)
nRBC: 0 % (ref 0.0–0.2)

## 2023-06-20 LAB — BASIC METABOLIC PANEL
Anion gap: 10 (ref 5–15)
BUN: 27 mg/dL — ABNORMAL HIGH (ref 8–23)
CO2: 24 mmol/L (ref 22–32)
Calcium: 9.6 mg/dL (ref 8.9–10.3)
Chloride: 103 mmol/L (ref 98–111)
Creatinine, Ser: 0.78 mg/dL (ref 0.61–1.24)
GFR, Estimated: >60 mL/min (ref >60)
Glucose, Bld: 102 mg/dL — ABNORMAL HIGH (ref 70–99)
Potassium: 3.9 mmol/L (ref 3.5–5.1)
Sodium: 137 mmol/L (ref 135–145)

## 2023-06-20 LAB — SARS CORONAVIRUS 2 BY RT PCR: SARS Coronavirus 2 by RT PCR: NEGATIVE

## 2023-06-20 MED ORDER — CEFPODOXIME PROXETIL 100 MG PO TABS: 100.0000 mg | ORAL_TABLET | Freq: Two times a day (BID) | ORAL | 0 refills | Status: AC

## 2023-06-20 MED ORDER — SODIUM CHLORIDE 0.9 % IV SOLN
1.0000 g | Freq: Once | INTRAVENOUS | Status: AC
  Administered 2023-06-20: 1 g via INTRAVENOUS
  Filled 2023-06-20: qty 10

## 2023-06-20 MED ORDER — SODIUM CHLORIDE 0.9 % IV BOLUS
1000.0000 mL | Freq: Once | INTRAVENOUS | Status: AC
  Administered 2023-06-20: 1000 mL via INTRAVENOUS

## 2023-06-20 NOTE — Telephone Encounter (Signed)
Called pt's daughter-in-law to find out if they still wanted peg placed. They are unsure at this time. Pt is currently in the hospital. I will check back with them in about to week to see what they have decided. AB

## 2023-06-20 NOTE — ED Provider Notes (Signed)
[place] EM[facility]GENCY DEPARTMENT AT [facility] Provider Note   CSN: [id number] Arrival date & time: 06/20/23  1459     History Chief Complaint  Patient presents with   Pneumonia    HPI Craig Snow. is a 89 y.o. male presenting for chief complaint of AMS . Frequent UTI and CAP 2/2 aspiration. Has Keflex at home for PRN use for frequent infection. Been confused over the last week.  Patient's recorded medical, surgical, social, medication list and allergies were reviewed in the Snapshot window as part of the initial history.   Review of Systems   Review of Systems  Unable to perform ROS: Dementia    Physical Exam Updated Vital Signs BP (!) 158/78   Pulse 90   Temp 98.3 F (36.8 C) (Oral)   Resp 16   Ht 5\' 11"  (1.803 m)   Wt 64 kg   SpO2 93%   BMI 19.68 kg/m  Physical Exam Vitals and nursing note reviewed.  Constitutional:      General: He is not in acute distress.    Appearance: He is well-developed.  HENT:     Head: Normocephalic and atraumatic.  Eyes:     Conjunctiva/sclera: Conjunctivae normal.  Cardiovascular:     Rate and Rhythm: Normal rate and regular rhythm.     Heart sounds: No murmur heard. Pulmonary:     Effort: Respiratory distress present.     Breath sounds: Rhonchi present.  Abdominal:     Palpations: Abdomen is soft.     Tenderness: There is no abdominal tenderness.  Musculoskeletal:        General: No swelling.     Cervical back: Neck supple.  Skin:    General: Skin is warm and dry.     Capillary Refill: Capillary refill takes less than 2 seconds.  Neurological:     General: No focal deficit present.     Mental Status: He is alert. Mental status is at baseline. He is disoriented.  Psychiatric:        Mood and Affect: Mood normal.      ED Course/ Medical Decision Making/ A&P    Procedures Procedures   Medications Ordered in ED Medications  cefTRIAXone (ROCEPHIN) 1 g in sodium chloride 0.9 % 100 mL IVPB (1 g  Intravenous New Bag/Given 06/20/23 1810)  sodium chloride 0.9 % bolus 1,000 mL (1,000 mLs Intravenous New Bag/Given 06/20/23 1811)    Medical Decision Making:    Craig Snow. is a 89 y.o. male who presented to the ED today with fever cough congestion altered mental status detailed above.     Additional history discussed with patient's family/caregivers.  Patient placed on continuous vitals and telemetry monitoring while in ED which was reviewed periodically.   Complete initial physical exam performed, notably the patient  was hemodynamically stable no acute distress.  Very substantial cough productive of sputum while I am evaluating the patient.   Reviewed and confirmed nursing documentation for past medical history, family history, social history.    Initial Assessment:   With the patient's presentation of cough and congestion altered mental status, most likely diagnosis is pneumonia. Other diagnoses were considered including (but not limited to) urinary tract infection, sepsis, severe metabolic abnormality. These are considered less likely due to history of present illness and physical exam findings.   This is most consistent with an acute life/limb threatening illness complicated by underlying chronic conditions.  Initial Plan:  Screening labs including CBC and  Metabolic panel to evaluate for infectious or metabolic etiology of disease.  Considered urinalysis with reflex culture ordered to evaluate for UTI or relevant urologic/nephrologic pathology, however family declined.  They stated that he had one with his PCP that was negative earlier today.  I cannot review these independent results but can you the summary of the visit that states that the urinalysis was normal Recommended CXR to evaluate for structural/infectious intrathoracic pathology.  However patient had an outpatient x-ray earlier today.  I was able to pull it up in PACS.  No formal radiology report, however he does appear  to have a left retrocardiac opacity and right lower lobe perihilar opacity consistent with multifocal pneumonia  Viral testing for covid/flu/RSV Objective evaluation as below reviewed with plan for close reassessment  Initial Study Results:   Laboratory  All laboratory results reviewed without evidence of clinically relevant pathology.    Reassessment and Plan:   Discussed with son and power of attorney at bedside. They are trying to keep him out of the hospital due to multiple episodes of hospital-acquired delirium requiring weeks of rehabilitation. He does appear to have a pneumonia not responding to outpatient Keflex. They are requesting single dose IV therapy in emergency room with outpatient antibiotics per the request of their primary care provider.  Given lab work being overall reassuring, believe this is reasonable. Will start on Rocephin in the emergency room plan for follow-up with PCP in 72 hours for reassessment.   Disposition:  I have considered need for hospitalization, however, considering all of the above, I believe this patient is stable for discharge at this time.  Patient/family educated about specific return precautions for given chief complaint and symptoms.  Patient/family educated about follow-up with PCP.      Patient/family expressed understanding of return precautions and need for follow-up. Patient spoken to regarding all imaging and laboratory results and appropriate follow up for these results. All education provided in verbal form with additional information in written form. Time was allowed for answering of patient questions. Patient discharged.    Emergency Department Medication Summary:   Medications  cefTRIAXone (ROCEPHIN) 1 g in sodium chloride 0.9 % 100 mL IVPB (1 g Intravenous New Bag/Given 06/20/23 1810)  sodium chloride 0.9 % bolus 1,000 mL (1,000 mLs Intravenous New Bag/Given 06/20/23 1811)     Clinical Impression:  1. Community acquired  pneumonia, unspecified laterality      Discharge   Final Clinical Impression(s) / ED Diagnoses Final diagnoses:  Community acquired pneumonia, unspecified laterality    Rx / DC Orders ED Discharge Orders          Ordered    cefpodoxime (VANTIN) 100 MG tablet  2 times daily        06/20/23 1929              Glyn Ade, MD 06/20/23 1930

## 2023-06-20 NOTE — ED Triage Notes (Addendum)
Patient was told he has double pneumonia, had the xray 1 hour ago. His family is concerned he has a UTI. Has a foley catheter. Family said he is more delirious and altered.

## 2023-07-19 ENCOUNTER — Emergency Department (HOSPITAL_COMMUNITY): Payer: Non-veteran care

## 2023-07-19 ENCOUNTER — Encounter (HOSPITAL_COMMUNITY): Payer: Self-pay

## 2023-07-19 ENCOUNTER — Other Ambulatory Visit: Payer: Self-pay

## 2023-07-19 ENCOUNTER — Inpatient Hospital Stay (HOSPITAL_COMMUNITY)
Admission: EM | Admit: 2023-07-19 | Discharge: 2023-07-23 | DRG: 178 | Disposition: A | Discharge status: Hospice | Payer: Non-veteran care | Attending: Internal Medicine | Admitting: Internal Medicine

## 2023-07-19 DIAGNOSIS — R451 Restlessness and agitation: Secondary | ICD-10-CM

## 2023-07-19 DIAGNOSIS — Z923 Personal history of irradiation: Secondary | ICD-10-CM

## 2023-07-19 DIAGNOSIS — G8929 Other chronic pain: Secondary | ICD-10-CM | POA: Diagnosis present

## 2023-07-19 DIAGNOSIS — N401 Enlarged prostate with lower urinary tract symptoms: Secondary | ICD-10-CM | POA: Diagnosis present

## 2023-07-19 DIAGNOSIS — T66XXXA Radiation sickness, unspecified, initial encounter: Secondary | ICD-10-CM | POA: Diagnosis present

## 2023-07-19 DIAGNOSIS — Z7901 Long term (current) use of anticoagulants: Secondary | ICD-10-CM | POA: Diagnosis not present

## 2023-07-19 DIAGNOSIS — J690 Pneumonitis due to inhalation of food and vomit: Secondary | ICD-10-CM | POA: Diagnosis present

## 2023-07-19 DIAGNOSIS — Z96642 Presence of left artificial hip joint: Secondary | ICD-10-CM | POA: Diagnosis present

## 2023-07-19 DIAGNOSIS — R64 Cachexia: Secondary | ICD-10-CM | POA: Diagnosis present

## 2023-07-19 DIAGNOSIS — Z515 Encounter for palliative care: Secondary | ICD-10-CM

## 2023-07-19 DIAGNOSIS — R54 Age-related physical debility: Secondary | ICD-10-CM | POA: Diagnosis present

## 2023-07-19 DIAGNOSIS — R944 Abnormal results of kidney function studies: Secondary | ICD-10-CM | POA: Diagnosis present

## 2023-07-19 DIAGNOSIS — R4589 Other symptoms and signs involving emotional state: Secondary | ICD-10-CM

## 2023-07-19 DIAGNOSIS — Z974 Presence of external hearing-aid: Secondary | ICD-10-CM

## 2023-07-19 DIAGNOSIS — I10 Essential (primary) hypertension: Secondary | ICD-10-CM | POA: Diagnosis present

## 2023-07-19 DIAGNOSIS — Z881 Allergy status to other antibiotic agents status: Secondary | ICD-10-CM

## 2023-07-19 DIAGNOSIS — I4891 Unspecified atrial fibrillation: Secondary | ICD-10-CM | POA: Diagnosis present

## 2023-07-19 DIAGNOSIS — H9190 Unspecified hearing loss, unspecified ear: Secondary | ICD-10-CM | POA: Diagnosis present

## 2023-07-19 DIAGNOSIS — E785 Hyperlipidemia, unspecified: Secondary | ICD-10-CM | POA: Diagnosis present

## 2023-07-19 DIAGNOSIS — Z79899 Other long term (current) drug therapy: Secondary | ICD-10-CM

## 2023-07-19 DIAGNOSIS — R338 Other retention of urine: Secondary | ICD-10-CM | POA: Diagnosis present

## 2023-07-19 DIAGNOSIS — Z66 Do not resuscitate: Secondary | ICD-10-CM | POA: Diagnosis present

## 2023-07-19 DIAGNOSIS — Z87891 Personal history of nicotine dependence: Secondary | ICD-10-CM

## 2023-07-19 DIAGNOSIS — K219 Gastro-esophageal reflux disease without esophagitis: Secondary | ICD-10-CM | POA: Diagnosis present

## 2023-07-19 DIAGNOSIS — Z886 Allergy status to analgesic agent status: Secondary | ICD-10-CM

## 2023-07-19 DIAGNOSIS — Z888 Allergy status to other drugs, medicaments and biological substances status: Secondary | ICD-10-CM

## 2023-07-19 DIAGNOSIS — Z8551 Personal history of malignant neoplasm of bladder: Secondary | ICD-10-CM

## 2023-07-19 DIAGNOSIS — J189 Pneumonia, unspecified organism: Principal | ICD-10-CM | POA: Diagnosis present

## 2023-07-19 DIAGNOSIS — R0609 Other forms of dyspnea: Secondary | ICD-10-CM | POA: Diagnosis not present

## 2023-07-19 DIAGNOSIS — Z960 Presence of urogenital implants: Secondary | ICD-10-CM | POA: Diagnosis present

## 2023-07-19 DIAGNOSIS — I272 Pulmonary hypertension, unspecified: Secondary | ICD-10-CM | POA: Diagnosis present

## 2023-07-19 DIAGNOSIS — F419 Anxiety disorder, unspecified: Secondary | ICD-10-CM | POA: Diagnosis present

## 2023-07-19 DIAGNOSIS — E872 Acidosis, unspecified: Secondary | ICD-10-CM | POA: Diagnosis present

## 2023-07-19 DIAGNOSIS — Z7189 Other specified counseling: Secondary | ICD-10-CM | POA: Diagnosis not present

## 2023-07-19 DIAGNOSIS — R1310 Dysphagia, unspecified: Secondary | ICD-10-CM | POA: Diagnosis present

## 2023-07-19 DIAGNOSIS — D72829 Elevated white blood cell count, unspecified: Secondary | ICD-10-CM | POA: Diagnosis not present

## 2023-07-19 DIAGNOSIS — Y842 Radiological procedure and radiotherapy as the cause of abnormal reaction of the patient, or of later complication, without mention of misadventure at the time of the procedure: Secondary | ICD-10-CM | POA: Diagnosis present

## 2023-07-19 DIAGNOSIS — E039 Hypothyroidism, unspecified: Secondary | ICD-10-CM | POA: Diagnosis present

## 2023-07-19 DIAGNOSIS — Z1152 Encounter for screening for COVID-19: Secondary | ICD-10-CM | POA: Diagnosis not present

## 2023-07-19 DIAGNOSIS — R627 Adult failure to thrive: Secondary | ICD-10-CM | POA: Diagnosis present

## 2023-07-19 DIAGNOSIS — R6889 Other general symptoms and signs: Secondary | ICD-10-CM | POA: Diagnosis not present

## 2023-07-19 DIAGNOSIS — E639 Nutritional deficiency, unspecified: Secondary | ICD-10-CM | POA: Diagnosis present

## 2023-07-19 DIAGNOSIS — Z681 Body mass index (BMI) 19 or less, adult: Secondary | ICD-10-CM | POA: Diagnosis not present

## 2023-07-19 DIAGNOSIS — Z885 Allergy status to narcotic agent status: Secondary | ICD-10-CM

## 2023-07-19 DIAGNOSIS — Z882 Allergy status to sulfonamides status: Secondary | ICD-10-CM

## 2023-07-19 DIAGNOSIS — Z8701 Personal history of pneumonia (recurrent): Secondary | ICD-10-CM

## 2023-07-19 DIAGNOSIS — Z79890 Hormone replacement therapy (postmenopausal): Secondary | ICD-10-CM

## 2023-07-19 DIAGNOSIS — M549 Dorsalgia, unspecified: Secondary | ICD-10-CM | POA: Diagnosis present

## 2023-07-19 DIAGNOSIS — Z85819 Personal history of malignant neoplasm of unspecified site of lip, oral cavity, and pharynx: Secondary | ICD-10-CM

## 2023-07-19 LAB — BRAIN NATRIURETIC PEPTIDE: B Natriuretic Peptide: 338.2 pg/mL — ABNORMAL HIGH (ref 0.0–100.0)

## 2023-07-19 LAB — COMPREHENSIVE METABOLIC PANEL
ALT: 19 U/L (ref 0–44)
AST: 27 U/L (ref 15–41)
Albumin: 2.8 g/dL — ABNORMAL LOW (ref 3.5–5.0)
Alkaline Phosphatase: 113 U/L (ref 38–126)
Anion gap: 10 (ref 5–15)
BUN: 34 mg/dL — ABNORMAL HIGH (ref 8–23)
CO2: 24 mmol/L (ref 22–32)
Calcium: 10 mg/dL (ref 8.9–10.3)
Chloride: 103 mmol/L (ref 98–111)
Creatinine, Ser: 0.77 mg/dL (ref 0.61–1.24)
GFR, Estimated: >60 mL/min (ref >60)
Glucose, Bld: 143 mg/dL — ABNORMAL HIGH (ref 70–99)
Potassium: 3.8 mmol/L (ref 3.5–5.1)
Sodium: 137 mmol/L (ref 135–145)
Total Bilirubin: 1.1 mg/dL (ref 0.0–1.2)
Total Protein: 7 g/dL (ref 6.5–8.1)

## 2023-07-19 LAB — I-STAT CHEM 8, ED
BUN: 31 mg/dL — ABNORMAL HIGH (ref 8–23)
Calcium, Ion: 1.31 mmol/L (ref 1.15–1.40)
Chloride: 104 mmol/L (ref 98–111)
Creatinine, Ser: 0.9 mg/dL (ref 0.61–1.24)
Glucose, Bld: 138 mg/dL — ABNORMAL HIGH (ref 70–99)
HCT: 42 % (ref 39.0–52.0)
Hemoglobin: 14.3 g/dL (ref 13.0–17.0)
Potassium: 3.7 mmol/L (ref 3.5–5.1)
Sodium: 141 mmol/L (ref 135–145)
TCO2: 24 mmol/L (ref 22–32)

## 2023-07-19 LAB — RESP PANEL BY RT-PCR (RSV, FLU A&B, COVID)  RVPGX2
Influenza A by PCR: NEGATIVE
Influenza B by PCR: NEGATIVE
Resp Syncytial Virus by PCR: NEGATIVE
SARS Coronavirus 2 by RT PCR: NEGATIVE

## 2023-07-19 LAB — CBC WITH DIFFERENTIAL/PLATELET
Abs Immature Granulocytes: 0.25 10*3/uL — ABNORMAL HIGH (ref 0.00–0.07)
Basophils Absolute: 0 10*3/uL (ref 0.0–0.1)
Basophils Relative: 0 %
Eosinophils Absolute: 0 10*3/uL (ref 0.0–0.5)
Eosinophils Relative: 0 %
HCT: 42.2 % (ref 39.0–52.0)
Hemoglobin: 13.2 g/dL (ref 13.0–17.0)
Immature Granulocytes: 1 %
Lymphocytes Relative: 6 %
Lymphs Abs: 1.3 10*3/uL (ref 0.7–4.0)
MCH: 33.8 pg (ref 26.0–34.0)
MCHC: 31.3 g/dL (ref 30.0–36.0)
MCV: 107.9 fL — ABNORMAL HIGH (ref 80.0–100.0)
Monocytes Absolute: 1.2 10*3/uL — ABNORMAL HIGH (ref 0.1–1.0)
Monocytes Relative: 6 %
Neutro Abs: 18.3 10*3/uL — ABNORMAL HIGH (ref 1.7–7.7)
Neutrophils Relative %: 87 %
Platelets: 208 10*3/uL (ref 150–400)
RBC: 3.91 MIL/uL — ABNORMAL LOW (ref 4.22–5.81)
RDW: 13.8 % (ref 11.5–15.5)
WBC: 21 10*3/uL — ABNORMAL HIGH (ref 4.0–10.5)
nRBC: 0 % (ref 0.0–0.2)

## 2023-07-19 LAB — LIPASE, BLOOD: Lipase: 19 U/L (ref 11–51)

## 2023-07-19 LAB — TROPONIN I (HIGH SENSITIVITY)
Troponin I (High Sensitivity): 13 ng/L (ref <18)
Troponin I (High Sensitivity): 20 ng/L — ABNORMAL HIGH (ref <18)

## 2023-07-19 LAB — I-STAT CG4 LACTIC ACID, ED: Lactic Acid, Venous: 3.1 mmol/L (ref 0.5–1.9)

## 2023-07-19 MED ORDER — BUSPIRONE HCL 5 MG PO TABS
5.0000 mg | ORAL_TABLET | Freq: Two times a day (BID) | ORAL | Status: DC
Start: 2023-07-19 — End: 2023-07-21
  Administered 2023-07-20: 5 mg via ORAL
  Filled 2023-07-19: qty 1

## 2023-07-19 MED ORDER — LORAZEPAM 2 MG/ML IJ SOLN
0.5000 mg | Freq: Once | INTRAMUSCULAR | Status: AC
  Administered 2023-07-19: 0.5 mg via INTRAVENOUS
  Filled 2023-07-19: qty 1

## 2023-07-19 MED ORDER — METHYLPREDNISOLONE SODIUM SUCC 40 MG IJ SOLR
40.0000 mg | INTRAMUSCULAR | Status: DC
  Administered 2023-07-20 – 2023-07-22 (×3): 40 mg via INTRAVENOUS
  Filled 2023-07-19 (×4): qty 1

## 2023-07-19 MED ORDER — ENOXAPARIN SODIUM 40 MG/0.4ML IJ SOSY
40.0000 mg | PREFILLED_SYRINGE | Freq: Every day | INTRAMUSCULAR | Status: DC
  Administered 2023-07-20 – 2023-07-22 (×3): 40 mg via SUBCUTANEOUS
  Filled 2023-07-19 (×4): qty 0.4

## 2023-07-19 MED ORDER — SODIUM CHLORIDE 0.9% FLUSH
3.0000 mL | Freq: Two times a day (BID) | INTRAVENOUS | Status: DC
  Administered 2023-07-19 – 2023-07-23 (×8): 3 mL via INTRAVENOUS

## 2023-07-19 MED ORDER — ACETAMINOPHEN 650 MG RE SUPP: 650.0000 mg | Freq: Four times a day (QID) | RECTAL | Status: DC | PRN

## 2023-07-19 MED ORDER — ACETAMINOPHEN 325 MG PO TABS: 650.0000 mg | ORAL_TABLET | Freq: Four times a day (QID) | ORAL | Status: DC | PRN

## 2023-07-19 MED ORDER — LACTATED RINGERS IV BOLUS
1000.0000 mL | Freq: Once | INTRAVENOUS | Status: AC
  Administered 2023-07-19: 1000 mL via INTRAVENOUS

## 2023-07-19 MED ORDER — SODIUM CHLORIDE 0.9 % IV SOLN
500.0000 mg | Freq: Once | INTRAVENOUS | Status: AC
  Administered 2023-07-19: 500 mg via INTRAVENOUS
  Filled 2023-07-19: qty 5

## 2023-07-19 MED ORDER — CEFTRIAXONE SODIUM 1 G IJ SOLR
1.0000 g | Freq: Once | INTRAMUSCULAR | Status: AC
  Administered 2023-07-19: 1 g via INTRAVENOUS
  Filled 2023-07-19: qty 10

## 2023-07-19 MED ORDER — GUAIFENESIN-DM 100-10 MG/5ML PO SYRP: 5.0000 mL | ORAL_SOLUTION | ORAL | Status: DC | PRN

## 2023-07-19 MED ORDER — SODIUM CHLORIDE 0.9% FLUSH: 3.0000 mL | INTRAVENOUS | Status: DC | PRN

## 2023-07-19 MED ORDER — ONDANSETRON HCL 4 MG/2ML IJ SOLN
4.0000 mg | Freq: Four times a day (QID) | INTRAMUSCULAR | Status: DC | PRN
Start: 2023-07-19 — End: 2023-07-23

## 2023-07-19 MED ORDER — MELATONIN 5 MG PO TABS
10.0000 mg | ORAL_TABLET | Freq: Every day | ORAL | Status: DC
Start: 2023-07-19 — End: 2023-07-22
  Administered 2023-07-20: 10 mg via ORAL
  Filled 2023-07-19: qty 2

## 2023-07-19 MED ORDER — SODIUM CHLORIDE 0.9 % IV SOLN: 250.0000 mL | INTRAVENOUS | Status: AC | PRN

## 2023-07-19 MED ORDER — MIRTAZAPINE 15 MG PO TABS
15.0000 mg | ORAL_TABLET | Freq: Every day | ORAL | Status: DC
  Administered 2023-07-20: 15 mg via ORAL
  Filled 2023-07-19: qty 1

## 2023-07-19 MED ORDER — HALOPERIDOL 1 MG PO TABS: 0.5000 mg | ORAL_TABLET | Freq: Three times a day (TID) | ORAL | Status: DC | PRN

## 2023-07-19 MED ORDER — SODIUM CHLORIDE 0.9 % IV SOLN: INTRAVENOUS | Status: AC

## 2023-07-19 MED ORDER — METOPROLOL TARTRATE 25 MG PO TABS
25.0000 mg | ORAL_TABLET | Freq: Two times a day (BID) | ORAL | Status: DC
  Administered 2023-07-20 – 2023-07-23 (×4): 25 mg via ORAL
  Filled 2023-07-19 (×4): qty 1

## 2023-07-19 MED ORDER — IPRATROPIUM-ALBUTEROL 0.5-2.5 (3) MG/3ML IN SOLN: 3.0000 mL | RESPIRATORY_TRACT | Status: DC | PRN

## 2023-07-19 MED ORDER — HALOPERIDOL LACTATE 5 MG/ML IJ SOLN
2.0000 mg | Freq: Four times a day (QID) | INTRAMUSCULAR | Status: DC | PRN
  Administered 2023-07-19 – 2023-07-23 (×9): 2 mg via INTRAVENOUS
  Filled 2023-07-19 (×9): qty 1

## 2023-07-19 MED ORDER — SODIUM CHLORIDE 0.9 % IV SOLN
3.0000 g | Freq: Three times a day (TID) | INTRAVENOUS | Status: DC
  Administered 2023-07-19 – 2023-07-21 (×5): 3 g via INTRAVENOUS
  Filled 2023-07-19 (×7): qty 8

## 2023-07-19 MED ORDER — LEVOTHYROXINE SODIUM 50 MCG PO TABS
50.0000 ug | ORAL_TABLET | Freq: Every day | ORAL | Status: DC
  Administered 2023-07-21 – 2023-07-23 (×2): 50 ug via ORAL
  Filled 2023-07-19 (×3): qty 1

## 2023-07-19 MED ORDER — IOHEXOL 300 MG/ML  SOLN
75.0000 mL | Freq: Once | INTRAMUSCULAR | Status: AC | PRN
  Administered 2023-07-19: 75 mL via INTRAVENOUS

## 2023-07-19 MED ORDER — LIOTHYRONINE SODIUM 5 MCG PO TABS
5.0000 ug | ORAL_TABLET | Freq: Every day | ORAL | Status: DC
  Administered 2023-07-21 – 2023-07-23 (×2): 5 ug via ORAL
  Filled 2023-07-19 (×4): qty 1

## 2023-07-19 MED ORDER — POLYETHYLENE GLYCOL 3350 17 G PO PACK: 17.0000 g | PACK | Freq: Every day | ORAL | Status: DC | PRN

## 2023-07-19 MED ORDER — HYDRALAZINE HCL 20 MG/ML IJ SOLN: 5.0000 mg | Freq: Four times a day (QID) | INTRAMUSCULAR | Status: DC | PRN

## 2023-07-19 MED ORDER — ONDANSETRON HCL 4 MG PO TABS: 4.0000 mg | ORAL_TABLET | Freq: Four times a day (QID) | ORAL | Status: DC | PRN

## 2023-07-19 MED ORDER — NYSTATIN 100000 UNIT/ML MT SUSP
4.0000 mL | Freq: Four times a day (QID) | OROMUCOSAL | Status: DC
Start: 2023-07-19 — End: 2023-07-23
  Administered 2023-07-20 – 2023-07-23 (×8): 500000 [IU] via ORAL
  Filled 2023-07-19 (×13): qty 5

## 2023-07-19 NOTE — ED Notes (Signed)
 Pt is agitated and refusing meds/unable to follow commands.

## 2023-07-19 NOTE — ED Triage Notes (Signed)
 Patient is here for evaluation of irregular breathing/ shortness of breath. Family reports oxygen levels have been decreasing. Family also reports not eating or drinking well. Family also reports that he has not been sleeping well and increase in weakness. Scheduled to get a feeding tube 07/26/23.

## 2023-07-19 NOTE — Hospital Course (Addendum)
89yo with h/o afib not on AC, HLD, bladder CA, BPH, and HTN who presented on 1/15 with cough and SOB.  He has known aspiration, is planning to undergo PEG tube placement on 1/22.  CXR with multifocal PNA, likely associated with continued aspiration.

## 2023-07-19 NOTE — H&P (Addendum)
 [facility] History and Physical  Craig Snow. ZOX:[id number] DOB: Apr 20, 1921 DOA: 07/19/2023  Referring physician: ED  PCP: Jimmey Mould, MD   Patient is coming from: Home  Chief Complaint: Cough congestion  HPI:  Patient is 89 years old male with past medical history of atrial fibrillation not on anticoagulation, GERD, hyperlipidemia, history of bladder cancer and BPH, hypertension presented to hospital with cough congestion and shortness of breath with decreased appetite and increasing generalized weakness for last 1 to 2 weeks.  He has been having decreased appetite and was scheduled to have PEG tube placement as outpatient on 07/26/2023.   At home patient is normally okay with oxygenation but was reported to have low pulse ox of 80% at home including low blood pressure.  There is no mention of recent nausea, vomiting, diarrhea or abdominal pain.  He has been having some cough especially while eating.  He also complains of chronic back pain and hard of hearing.  Family reported that he was more confused and sleepy and was brought into the hospital.  There is no mention of fever, chills or rigor at home.  No recent mention of falls or syncope.  In the ED, patient is afebrile.  Blood pressure was within normal range.  Labs were notable for significant leukocytosis with WBC at 21.0, BMP showed BUN elevated at 31 with creatinine of 0.9.  BNP was elevated at 338.  COVID, influenza and RSV was negative.  Chest x-ray showed multifocal patchy opacities worsening over the last month could be secondary to persistent or recurrent pneumonia, aspiration could not be ruled out.  CT scan of the abdomen and pelvis was also done due to low oral intake and some abdominal tenderness which showed small bilateral pleural effusion with patchy airspace opacities concerning for multifocal pneumonia.  Patient was then considered for admission to hospital for further evaluation and treatment.  Assessment  and Plan Principal Problem:   Multifocal pneumonia  Multifocal pneumonia with significant leukocytosis.   Possible aspiration pneumonia.   Significant leukocytosis present with lactic acid elevation.  Lactate was elevated at 3.1.  Chest x-ray showed multifocal infiltrates.  Received Rocephin  and Zithromax  in the ED.  Will continue with Unasyn  due to concern for aspiration pneumonia..  Follow blood cultures. Check speech swallow evaluation.  Check  urinary strep and Legionella antigen.  Supplemental oxygen as needed. Continue incentive spirometry.  BNP was elevated, will check 2D echocardiogram.  Check lactate in AM.  Aspiration precautions.  Was treated for pneumonia in the past but has not completely resolved likely secondary to ongoing aspiration.  Discussed this with the patient's son at bedside.  History of atrial fibrillation not on anticoagulation.  Currently rate controlled.  Continue metoprolol .  Failure to thrive, poor oral intake, elevated BUN.  Body mass index is 19.68 kg/m.  Family is considering PEG tube placement which is tentatively scheduled for 07/26/2023.  Patient's son request to have to be done while in the hospital.  Will consult IR, IV fluids for 1 day.  Reassess fluid needed in AM..  Continue Remeron  from home.  Will hold Lasix , patient was not taking at home either.  Will consult nutrition services due to failure to thrive.  History of hypothyroidism.  Continue Synthroid  and Cytomel .  Check TSH.  Hyperlipidemia On Lipitor at home.  Will hold for now.  Patient is actually not taking currently.  History of bladder cancer and BPH Chronic urinary retention. Presented with chronic indwelling Foley catheter.  Has had it for 2 years now.  Follows up with [facility] as outpatient.  No active issues at this time.  Essential hypertension. On metoprolol  25 mg twice daily at home.  Will resume.  Anxiety, agitation.  On BuSpar  Haldol  and Remeron  at home.  Will continue while  in the hospital.  Will put to as needed Haldol  if necessary for extra visitation.  Generalized weakness fatigue deconditioning.  Will get PT, OT evaluation.  Fall precautions.  Goals of care.  Discussed about overall goals of care with the patient's son at bedside.  Patient's son states that patient had wishes for feeding tube placement.  Family wishes to honor that.  Patient has had follow-up with palliative care as outpatient and family do not wish to pursue on it.    Discussed with the son that this could be ongoing issue especially if this is aspiration and that his clinical condition might continue to go downhill despite putting a feeding tube.  Patient's son has understood this but wishes to proceed with feeding tube.Craig Snow  DVT Prophylaxis: Lovenox  subcu  Review of Systems:  All systems were reviewed and were negative unless otherwise mentioned in the HPI   Past Medical History:  Diagnosis Date   Acquired thrombophilia (HCC)    Acute lower UTI 07/30/2021   Arrhythmia    Atrial fibrillation (HCC)    Bladder cancer (HCC)    BPH (benign prostatic hyperplasia)    Candidiasis of penis 07/30/2021   Chronic ear infection, left    Community acquired pneumonia 07/30/2021   aspiration   GERD (gastroesophageal reflux disease)    Headache    History of radiation therapy    oral cancer   Hyperlipidemia    Hypertension    Indwelling Foley catheter present    Neoplasm of palate 2008   Poor dentition    Ruptured eardrum, left    absent eardrum   Supratherapeutic INR 07/30/2021   Thyrotoxicosis    Past Surgical History:  Procedure Laterality Date   ANTERIOR APPROACH HEMI HIP ARTHROPLASTY Left 10/23/2021   Procedure: ANTERIOR APPROACH HEMI HIP ARTHROPLASTY;  Surgeon: Adonica Hoose, MD;  Location: [facility] OR;  Service: Orthopedics;  Laterality: Left;   INCISION AND DRAINAGE OF WOUND Left 04/13/2022   Procedure: IRRIGATION AND DEBRIDEMENT SUPERFICIAL HIP;  Surgeon: Adonica Hoose, MD;   Location: [facility] ORS;  Service: Orthopedics;  Laterality: Left;  120   MOUTH SURGERY     cancer    Social History:  reports that he quit smoking about 52 years ago. His smoking use included cigarettes. He has never used smokeless tobacco. He reports that he does not currently use alcohol. He reports that he does not currently use drugs.  Allergies  Allergen Reactions   Aspirin Other (See Comments)    Unknown reaction - listed on [facility] 07/30/21   Ibuprofen Anaphylaxis   Clindamycin/Lincomycin Other (See Comments)    Unknown reaction - listed on [facility] 07/30/21   Fluconazole Other (See Comments)    Hallucinations   Other Other (See Comments)    Unknown reaction to opioids - listed on [facility] 07/30/21 hallucinations   Sulfa Antibiotics Other (See Comments)    Unknown reaction - listed on [facility] 07/30/21   Tramadol  Other (See Comments)    Hallucinations/ Currently taking for severe pain    Family History  Problem Relation Age of Onset   Cancer Father      Prior to Admission medications   Medication Sig Start Date End Date Taking?  Authorizing Provider  acetaminophen  (TYLENOL ) 650 MG CR tablet Take 650-1,300 mg by mouth every 8 (eight) hours as needed for pain.    [provider]  atorvastatin  (LIPITOR) 10 MG tablet Take 10 mg by mouth at bedtime.    [provider]  benzonatate (TESSALON) 100 MG capsule Take 100-200 mg by mouth 3 (three) times daily as needed. 07/13/23   [provider]  busPIRone  (BUSPAR ) 5 MG tablet Take 5 mg by mouth 2 (two) times daily. 06/14/23   [provider]  furosemide  (LASIX ) 20 MG tablet Take 20 mg by mouth at bedtime.    [provider]  haloperidol  (HALDOL ) 0.5 MG tablet Take 0.5-1 mg by mouth 3 (three) times daily as needed for agitation. 07/17/23   [provider]  levothyroxine  (SYNTHROID ) 50 MCG tablet Take 50 mcg by mouth every morning.    [provider]  liothyronine  (CYTOMEL ) 5 MCG tablet Take 5 mcg by  mouth every morning.    [provider]  Melatonin 5 MG CAPS Take 10 mg by mouth at bedtime.    [provider]  metoprolol  tartrate (LOPRESSOR ) 25 MG tablet Take 1 tablet (25 mg total) by mouth 2 (two) times daily. 08/06/21   Magdalene School, MD  mirtazapine  (REMERON ) 15 MG tablet Take 15 mg by mouth at bedtime.    [provider]  nitrofurantoin , macrocrystal-monohydrate, (MACROBID ) 100 MG capsule Take 100 mg by mouth 2 (two) times daily. 07/03/23   [provider]  nystatin  (MYCOSTATIN ) 100000 UNIT/ML suspension Take 4-5 mLs by mouth 4 (four) times daily. 07/10/23   [provider]  potassium chloride  (KLOR-CON ) 10 MEQ tablet Take 10 mEq by mouth daily.    [provider]  traMADol  (ULTRAM ) 50 MG tablet Take 25 mg by mouth daily as needed for severe pain.    [provider]    Physical Exam:  Vitals:   07/19/23 1534 07/19/23 1536 07/19/23 1551 07/19/23 1700  BP:  105/63 (!) 147/79 118/87  Pulse:   79 99  Resp:   17 13  Temp:   97.6 F (36.4 C)   TempSrc:   Axillary   SpO2:   100% 93%  Weight: 64 kg     Height: 5\' 11"  (1.803 m)      Wt Readings from Last 3 Encounters:  07/19/23 64 kg  06/20/23 64 kg  04/25/23 63 kg   Body mass index is 19.68 kg/m.  General: , not in obvious distress, hard of hearing, elderly male, cachectic, Communicative, HENT: Normocephalic, No scleral pallor or icterus noted. Oral mucosa is dry Chest -coarse breath sounds noted bilaterally.  Diminished breath sounds bilaterally CVS: S1 &S2 heard. No murmur.  Regular rate and rhythm. Abdomen: Soft, nontender, nondistended.  Bowel sounds are heard. voluntary guarding noted, Foley catheter in place. Extremities: No cyanosis, clubbing or edema.  Psych: Alert, awake and Communicative, follows commands, hard of hearing, appears slightly confused, CNS:  No cranial nerve deficits.  Moves all extremities, generalized weakness noted, thin extremities, Skin:  Warm and dry.  No rashes noted.  Labs on Admission:   CBC: Recent Labs  Lab 07/19/23 1806 07/19/23 1812  WBC 21.0*  --   NEUTROABS 18.3*  --   HGB 13.2 14.3  HCT 42.2 42.0  MCV 107.9*  --   PLT 208  --     Basic Metabolic Panel: Recent Labs  Lab 07/19/23 1806 07/19/23 1812  NA 137 141  K 3.8 3.7  CL 103 104  CO2 24  --   GLUCOSE 143* 138*  BUN 34* 31*  CREATININE 0.77 0.90  CALCIUM  10.0  --     Liver Function Tests: Recent Labs  Lab 07/19/23 1806  AST 27  ALT 19  ALKPHOS 113  BILITOT 1.1  PROT 7.0  ALBUMIN  2.8*   Recent Labs  Lab 07/19/23 1806  LIPASE 19   No results for input(s): "AMMONIA" in the last 168 hours.  Cardiac Enzymes: No results for input(s): "CKTOTAL", "CKMB", "CKMBINDEX", "TROPONINI" in the last 168 hours.  BNP (last 3 results) Recent Labs    07/19/23 1806  BNP 338.2*    ProBNP (last 3 results) No results for input(s): "PROBNP" in the last 8760 hours.  CBG: No results for input(s): "GLUCAP" in the last 168 hours.  Lipase     Component Value Date/Time   LIPASE 19 07/19/2023 1806     Urinalysis    Component Value Date/Time   COLORURINE YELLOW 04/24/2023 2246   APPEARANCEUR HAZY (A) 04/24/2023 2246   LABSPEC 1.014 04/24/2023 2246   PHURINE 6.0 04/24/2023 2246   GLUCOSEU NEGATIVE 04/24/2023 2246   HGBUR MODERATE (A) 04/24/2023 2246   BILIRUBINUR NEGATIVE 04/24/2023 2246   KETONESUR NEGATIVE 04/24/2023 2246   PROTEINUR 30 (A) 04/24/2023 2246   NITRITE POSITIVE (A) 04/24/2023 2246   LEUKOCYTESUR LARGE (A) 04/24/2023 2246     Drugs of Abuse  No results found for: "LABOPIA", "COCAINSCRNUR", "LABBENZ", "AMPHETMU", "THCU", "LABBARB"    Radiological Exams on Admission: CT ABDOMEN PELVIS W CONTRAST Result Date: 07/19/2023 CLINICAL DATA:  Weakness.  Shortness of breath. EXAM: CT ABDOMEN AND PELVIS WITH CONTRAST TECHNIQUE: Multidetector CT imaging of the abdomen and pelvis was performed using the standard protocol following  bolus administration of intravenous contrast. RADIATION DOSE REDUCTION: This exam was performed according to the departmental dose-optimization program which includes automated exposure control, adjustment of the mA and/or kV according to patient size and/or use of iterative reconstruction technique. CONTRAST:  75mL OMNIPAQUE  IOHEXOL  300 MG/ML  SOLN COMPARISON:  January 28, 2022. FINDINGS: Lower chest: Small bilateral pleural effusions are noted with multiple adjacent patchy airspace opacities concerning for pneumonia. Hepatobiliary: No focal liver abnormality is seen. No gallstones, gallbladder wall thickening, or biliary dilatation. Pancreas: Unremarkable. No pancreatic ductal dilatation or surrounding inflammatory changes. Spleen: Normal in size without focal abnormality. Adrenals/Urinary Tract: Adrenal glands and kidneys are unremarkable. No hydronephrosis or renal obstruction. Urinary bladder is not visualized due to scatter artifact arising from bilateral hip arthroplasties. Stomach/Bowel: Stomach is within normal limits. Appendix appears normal. No evidence of bowel wall thickening, distention, or inflammatory changes. Vascular/Lymphatic: Aortic atherosclerosis. No enlarged abdominal or pelvic lymph nodes. Reproductive: Mild prostatic enlargement. Other: No ascites or hernia. Musculoskeletal: Status post bilateral hip arthroplasties. No acute osseous abnormality. IMPRESSION: Small bilateral pleural effusions are noted with multiple adjacent patchy airspace opacities concerning for multifocal pneumonia. Urinary bladder is not visualized due to scatter artifact arising from bilateral hip arthroplasties. Mild prostatic enlargement. Aortic Atherosclerosis (ICD10-I70.0). Electronically Signed   By: Rosalene Colon M.D.   On: 07/19/2023 19:26   DG Chest Portable 1 View Result Date: 07/19/2023 CLINICAL DATA:  Shortness of breath. EXAM: PORTABLE CHEST 1 VIEW COMPARISON:  06/20/2023 FINDINGS: The heart is normal in  size. Aortic atherosclerosis. Increasing patchy airspace disease at the right and left lung base. Increasing right upper lobe opacity abutting the fissure. There may be small pleural effusions. No pulmonary edema. No pneumothorax. IMPRESSION: Multifocal patchy opacities  with worsening over the last month. This may represent persistent or recurrent pneumonia. Aspiration is considered. Electronically Signed   By: Chadwick Colonel M.D.   On: 07/19/2023 18:05    EKG: Personally reviewed by me which shows atrial fibrillation   Consultant: None  Code Status: DNR/DNI  Microbiology blood cultures  Antibiotics: Unasyn  IV  Family Communication:  Patients' condition and plan of care including tests being ordered have been discussed with the patient and the patient's son at the bedside who indicate understanding and agree with the plan.   Status is: Inpatient   Severity of Illness: The appropriate patient status for this patient is INPATIENT. Inpatient status is judged to be reasonable and necessary in order to provide the required intensity of service to ensure the patient's safety. The patient's presenting symptoms, physical exam findings, and initial radiographic and laboratory data in the context of their chronic comorbidities is felt to place them at high risk for further clinical deterioration. Furthermore, it is not anticipated that the patient will be medically stable for discharge from the hospital within 2 midnights of admission.   * I certify that at the point of admission it is my clinical judgment that the patient will require inpatient hospital care spanning beyond 2 midnights from the point of admission due to high intensity of service, high risk for further deterioration and high frequency of surveillance required.*  Signed, Rosena Conradi, MD [facility] 07/19/2023

## 2023-07-19 NOTE — Progress Notes (Signed)
 Pharmacy Antibiotic Note  Craig B Tolles Jr. is a 89 y.o. male admitted on 07/19/2023 with aspiration pneumonia.  Pharmacy has been consulted for Unasyn  dosing.  Plan: Unasyn  3gm IV q8h Follow renal function F/u culture results & sensitivities  Height: 5\' 11"  (180.3 cm) Weight: 64 kg (141 lb 1.5 oz) IBW/kg (Calculated) : 75.3  Temp (24hrs), Avg:97.6 F (36.4 C), Min:97.6 F (36.4 C), Max:97.6 F (36.4 C)  Recent Labs  Lab 07/19/23 1806 07/19/23 1812 07/19/23 2002  WBC 21.0*  --   --   CREATININE 0.77 0.90  --   LATICACIDVEN  --   --  3.1*    Estimated Creatinine Clearance: 39.5 mL/min (by C-G formula based on SCr of 0.9 mg/dL).    Allergies  Allergen Reactions   Aspirin Other (See Comments)    Unknown reaction - listed on [facility] 07/30/21   Ibuprofen Anaphylaxis   Clindamycin/Lincomycin Other (See Comments)    Unknown reaction - listed on [facility] 07/30/21   Fluconazole Other (See Comments)    Hallucinations   Other Other (See Comments)    Unknown reaction to opioids - listed on [facility] 07/30/21 hallucinations   Sulfa Antibiotics Other (See Comments)    Unknown reaction - listed on [facility] 07/30/21   Tramadol  Other (See Comments)    Hallucinations/ Currently taking for severe pain    Antimicrobials this admission: 1/15 Ceftriaxone  x 1 1/15 Azithromycin  x 1 1/15 Unasyn  >>  Dose adjustments this admission:    Microbiology results: 1/15 BCx:      Thank you for allowing pharmacy to be a part of this patient's care.  Rulon Councilman, PharmD 07/19/2023 8:49 PM

## 2023-07-19 NOTE — ED Notes (Signed)
 Attempted to collect lab x2

## 2023-07-19 NOTE — ED Provider Notes (Signed)
Craig Snow AT [facility] Provider Note   CSN: [id number] Arrival date & time: 07/19/23  1523     History  Chief Complaint  Patient presents with   Shortness of Breath    Craig Snow. is a 89 y.o. male.  HPI     89 year old male with history of atrial fibrillation not on anticoagulation, GERD, hyperlipidemia, hypertension, bladder cancer and BPH, ED visit with altered mental status, congestion, cough with diagnosis of pneumonia placed on vantin 12/17, who presents with concern for irregular breathing/shortness of breath, decreased appetite, increasing generalized weakness.  Family at bedside reports for the past 1-2 weeks has not been eating or drinking much at all.  Is scheduled to have GT placed for nutrition and aspiration 1/22--he had said previously he would want that for nutrition. 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 No fall or trauma, no focal weakness or speech changes.  Concerned as vital signs at home were off from baseline--normally O2 97% but has been low today, reports to 70s at home, blood pressures low at home as well.  No nausea, vomiting, diarrhea. Has not been having as many BM but not eating or drinking.  No known fevers. Has had cough over last 2 weeks, does seem to be more related to eating and aspiration.  Reports they did talk about suprapubic catheter because of frequent foley changes.  Has had back pain chronically and reports this today.  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   Is DNR/DNI but is interested in feeding tube, IV fluids, antibiotics.       Past Medical History:  Diagnosis Date   Acquired thrombophilia (HCC)    Acute lower UTI 07/30/2021   Arrhythmia    Atrial fibrillation (HCC)    Bladder cancer (HCC)    BPH (benign prostatic hyperplasia)    Candidiasis of penis  07/30/2021   Chronic ear infection, left    Community acquired pneumonia 07/30/2021   aspiration   GERD (gastroesophageal reflux disease)    Headache    History of radiation therapy    oral cancer   Hyperlipidemia    Hypertension    Indwelling Foley catheter present    Neoplasm of palate 2008   Poor dentition    Ruptured eardrum, left    absent eardrum   Supratherapeutic INR 07/30/2021   Thyrotoxicosis      Home Medications Prior to Admission medications   Medication Sig Start Date End Date Taking? Authorizing Provider  acetaminophen (TYLENOL) 650 MG CR tablet Take 650-1,300 mg by mouth every 8 (eight) hours as needed for pain.   Yes [provider]  benzonatate (TESSALON) 100 MG capsule Take 100-200 mg by mouth 3 (three) times daily as needed for cough. 07/13/23  Yes [provider]  busPIRone (BUSPAR) 5 MG tablet Take 5 mg by mouth 2 (two) times daily. 06/14/23  Yes [provider]  furosemide (LASIX) 20 MG tablet Take 20 mg by mouth at bedtime.   Yes [provider]  haloperidol (HALDOL) 0.5 MG tablet Take 0.5-1 mg by mouth 3 (three) times daily as needed for agitation. 07/17/23  Yes [provider]  levothyroxine (SYNTHROID) 50 MCG tablet Take 50 mcg by mouth every morning.   Yes [provider]  liothyronine (CYTOMEL) 5 MCG tablet Take 5 mcg  by mouth every morning.   Yes [provider]  Melatonin 5 MG CAPS Take 10 mg by mouth at bedtime.   Yes [provider]  metoprolol tartrate (LOPRESSOR) 25 MG tablet Take 1 tablet (25 mg total) by mouth 2 (two) times daily. 08/06/21  Yes Willeen Niece, MD  mirtazapine (REMERON) 15 MG tablet Take 15 mg by mouth at bedtime.   Yes [provider]  nystatin (MYCOSTATIN) 100000 UNIT/ML suspension Take 4-5 mLs by mouth 4 (four) times daily. 07/10/23  Yes [provider]  potassium chloride (KLOR-CON) 10 MEQ tablet Take 10 mEq by mouth daily.   Yes [provider]  traMADol (ULTRAM) 50 MG tablet Take 25 mg by mouth daily as needed for severe pain.   Yes [provider]  atorvastatin (LIPITOR) 10 MG tablet Take 10 mg by mouth at bedtime. Patient not taking: Reported on 07/19/2023    [provider]      Allergies    Aspirin, Ibuprofen, Clindamycin/lincomycin, Fluconazole, Other, Sulfa antibiotics, and Tramadol    Review of Systems   Review of Systems  Physical Exam Updated Vital Signs BP 118/87   Pulse 99   Temp 98.6 F (37 C) (Oral)   Resp 13   Ht 5\' 11"  (1.803 m)   Wt 64 kg   SpO2 93%   BMI 19.68 kg/m  Physical Exam Vitals and nursing note reviewed.  Constitutional:      General: He is not in acute distress.    Appearance: He is well-developed. He is cachectic. He is ill-appearing. He is not diaphoretic.  HENT:     Head: Normocephalic and atraumatic.  Eyes:     Conjunctiva/sclera: Conjunctivae normal.  Cardiovascular:     Rate and Rhythm: Normal rate and regular rhythm.     Heart sounds: Normal heart sounds. No murmur heard.    No friction rub. No gallop.  Pulmonary:     Effort: Pulmonary effort is normal. No respiratory distress.     Breath sounds: Normal breath sounds. No wheezing or rales.  Abdominal:     General: There is no distension.     Palpations: Abdomen is soft.     Tenderness: There is abdominal tenderness (diffuse). There is no guarding.  Musculoskeletal:     Cervical back: Normal range of motion.  Skin:    General: Skin is warm and dry.  Neurological:     Mental Status: He is alert.     Comments: Oriented to self, hospital     ED Results / Procedures / Treatments   Labs (all labs ordered are listed, but only abnormal results are displayed) Labs Reviewed  CBC WITH DIFFERENTIAL/PLATELET - Abnormal; Notable for the following components:      Result Value   WBC 21.0 (*)    RBC 3.91 (*)    MCV 107.9 (*)    Neutro Abs 18.3 (*)    Monocytes Absolute 1.2 (*)    Abs Immature  Granulocytes 0.25 (*)    All other components within normal limits  COMPREHENSIVE METABOLIC PANEL - Abnormal; Notable for the following components:   Glucose, Bld 143 (*)    BUN 34 (*)    Albumin 2.8 (*)    All other components within normal limits  BRAIN NATRIURETIC PEPTIDE - Abnormal; Notable for the following components:   B Natriuretic Peptide 338.2 (*)    All other components within normal limits  I-STAT CG4 LACTIC ACID, ED - Abnormal; Notable for the following  components:   Lactic Acid, Venous 3.1 (*)    All other components within normal limits  I-STAT CHEM 8, ED - Abnormal; Notable for the following components:   BUN 31 (*)    Glucose, Bld 138 (*)    All other components within normal limits  TROPONIN I (HIGH SENSITIVITY) - Abnormal; Notable for the following components:   Troponin I (High Sensitivity) 20 (*)    All other components within normal limits  RESP PANEL BY RT-PCR (RSV, FLU A&B, COVID)  RVPGX2  CULTURE, BLOOD (ROUTINE X 2)  CULTURE, BLOOD (ROUTINE X 2)  LIPASE, BLOOD  URINALYSIS, W/ REFLEX TO CULTURE (INFECTION SUSPECTED)  COMPREHENSIVE METABOLIC PANEL  CBC  MAGNESIUM  PHOSPHORUS  LEGIONELLA PNEUMOPHILA SEROGP 1 UR AG  STREP PNEUMONIAE URINARY ANTIGEN  LACTIC ACID, PLASMA  TSH  I-STAT CG4 LACTIC ACID, ED  I-STAT CG4 LACTIC ACID, ED  I-STAT CG4 LACTIC ACID, ED  I-STAT CG4 LACTIC ACID, ED  TROPONIN I (HIGH SENSITIVITY)    EKG EKG Interpretation Date/Time:  Wednesday July 19 2023 17:14:38 EST Ventricular Rate:  84 PR Interval:    QRS Duration:  149 QT Interval:  402 QTC Calculation: 476 R Axis:   62  Text Interpretation: Atrial flutter/fibrillation Right bundle branch block Anteroseptal infarct, age indeterminate No significant change since last tracing Confirmed by Alvira Monday ([id number]) on 07/20/2023 1:10:16 AM  Radiology CT ABDOMEN PELVIS W CONTRAST Result Date: 07/19/2023 CLINICAL DATA:  Weakness.  Shortness of breath. EXAM: CT ABDOMEN AND  PELVIS WITH CONTRAST TECHNIQUE: Multidetector CT imaging of the abdomen and pelvis was performed using the standard protocol following bolus administration of intravenous contrast. RADIATION DOSE REDUCTION: This exam was performed according to the departmental dose-optimization program which includes automated exposure control, adjustment of the mA and/or kV according to patient size and/or use of iterative reconstruction technique. CONTRAST:  75mL OMNIPAQUE IOHEXOL 300 MG/ML  SOLN COMPARISON:  January 28, 2022. FINDINGS: Lower chest: Small bilateral pleural effusions are noted with multiple adjacent patchy airspace opacities concerning for pneumonia. Hepatobiliary: No focal liver abnormality is seen. No gallstones, gallbladder wall thickening, or biliary dilatation. Pancreas: Unremarkable. No pancreatic ductal dilatation or surrounding inflammatory changes. Spleen: Normal in size without focal abnormality. Adrenals/Urinary Tract: Adrenal glands and kidneys are unremarkable. No hydronephrosis or renal obstruction. Urinary bladder is not visualized due to scatter artifact arising from bilateral hip arthroplasties. Stomach/Bowel: Stomach is within normal limits. Appendix appears normal. No evidence of bowel wall thickening, distention, or inflammatory changes. Vascular/Lymphatic: Aortic atherosclerosis. No enlarged abdominal or pelvic lymph nodes. Reproductive: Mild prostatic enlargement. Other: No ascites or hernia. Musculoskeletal: Status post bilateral hip arthroplasties. No acute osseous abnormality. IMPRESSION: Small bilateral pleural effusions are noted with multiple adjacent patchy airspace opacities concerning for multifocal pneumonia. Urinary bladder is not visualized due to scatter artifact arising from bilateral hip arthroplasties. Mild prostatic enlargement. Aortic Atherosclerosis (ICD10-I70.0). Electronically Signed   By: Lupita Raider M.D.   On: 07/19/2023 19:26   DG Chest Portable 1 View Result Date:  07/19/2023 CLINICAL DATA:  Shortness of breath. EXAM: PORTABLE CHEST 1 VIEW COMPARISON:  06/20/2023 FINDINGS: The heart is normal in size. Aortic atherosclerosis. Increasing patchy airspace disease at the right and left lung base. Increasing right upper lobe opacity abutting the fissure. There may be small pleural effusions. No pulmonary edema. No pneumothorax. IMPRESSION: Multifocal patchy opacities with worsening over the last month. This may represent persistent or recurrent pneumonia. Aspiration is considered. Electronically Signed   By: Shawna Orleans  Sanford M.D.   On: 07/19/2023 18:05    Procedures Procedures    Medications Ordered in ED Medications  busPIRone (BUSPAR) tablet 5 mg (5 mg Oral Patient Refused/Not Given 07/19/23 2226)  haloperidol (HALDOL) tablet 0.5-1 mg (has no administration in time range)  mirtazapine (REMERON) tablet 15 mg (15 mg Oral Patient Refused/Not Given 07/19/23 2226)  levothyroxine (SYNTHROID) tablet 50 mcg (has no administration in time range)  liothyronine (CYTOMEL) tablet 5 mcg (has no administration in time range)  melatonin tablet 10 mg (10 mg Oral Patient Refused/Not Given 07/19/23 2226)  nystatin (MYCOSTATIN) 100000 UNIT/ML suspension 400,000-500,000 Units ( Oral Patient Refused/Not Given 07/19/23 2226)  guaiFENesin-dextromethorphan (ROBITUSSIN DM) 100-10 MG/5ML syrup 5 mL (has no administration in time range)  ipratropium-albuterol (DUONEB) 0.5-2.5 (3) MG/3ML nebulizer solution 3 mL (has no administration in time range)  enoxaparin (LOVENOX) injection 40 mg (40 mg Subcutaneous Patient Refused/Not Given 07/19/23 2227)  sodium chloride flush (NS) 0.9 % injection 3 mL (3 mLs Intravenous Given 07/19/23 2227)  sodium chloride flush (NS) 0.9 % injection 3 mL (has no administration in time range)  0.9 %  sodium chloride infusion (has no administration in time range)  acetaminophen (TYLENOL) tablet 650 mg (has no administration in time range)    Or  acetaminophen  (TYLENOL) suppository 650 mg (has no administration in time range)  polyethylene glycol (MIRALAX / GLYCOLAX) packet 17 g (has no administration in time range)  ondansetron (ZOFRAN) tablet 4 mg (has no administration in time range)    Or  ondansetron (ZOFRAN) injection 4 mg (has no administration in time range)  hydrALAZINE (APRESOLINE) injection 5 mg (has no administration in time range)  methylPREDNISolone sodium succinate (SOLU-MEDROL) 40 mg/mL injection 40 mg (40 mg Intravenous Patient Refused/Not Given 07/19/23 2226)  metoprolol tartrate (LOPRESSOR) tablet 25 mg (25 mg Oral Patient Refused/Not Given 07/19/23 2225)  0.9 %  sodium chloride infusion ( Intravenous New Bag/Given 07/19/23 2224)  Ampicillin-Sulbactam (UNASYN) 3 g in sodium chloride 0.9 % 100 mL IVPB (3 g Intravenous New Bag/Given 07/19/23 2225)  haloperidol lactate (HALDOL) injection 2 mg (2 mg Intravenous Given 07/19/23 2114)  lactated ringers bolus 1,000 mL (0 mLs Intravenous Stopped 07/19/23 1815)  iohexol (OMNIPAQUE) 300 MG/ML solution 75 mL (75 mLs Intravenous Contrast Given 07/19/23 1833)  cefTRIAXone (ROCEPHIN) 1 g in sodium chloride 0.9 % 100 mL IVPB (0 g Intravenous Stopped 07/19/23 2033)  azithromycin (ZITHROMAX) 500 mg in sodium chloride 0.9 % 250 mL IVPB (0 mg Intravenous Stopped 07/19/23 2214)  LORazepam (ATIVAN) injection 0.5 mg (0.5 mg Intravenous Given 07/19/23 2333)    ED Course/ Medical Decision Making/ A&P                                    89 year old male with history of atrial fibrillation not on anticoagulation, GERD, hyperlipidemia, hypertension, bladder cancer and BPH, ED visit with altered mental status, congestion, cough with diagnosis of pneumonia placed on vantin 12/17, who presents with concern for irregular breathing/shortness of breath, decreased appetite, increasing generalized weakness.  DDx includes infection, electrolyte abnormality, dehydration, anemia, medication effect, ACS, intraabdominal  abnormality, end of life with associated loss of appetite.  Have low suspicion for intracranial bleed without history of falls or trauma, no headache, or focal neurologic changes.  Discussed goals of care with family at bedside.  They report he is DNR and DNI \, however they are interested in further  medical workups, IV antibiotics, fluids, and have plan for feeding tube placement for nutrition.  EKG completed and personally evaluated interpreted by me shows atrial fibrillation without acute change from prior.  Labs completed and personally eval and interpreted by me show lactic acidosis, leukocytosis, mild elevation in BUN without AKI, normal lipase, normal initial troponin, mildly elevated BNP without prior for comparison.  COVID, influenza and RSV testing are negative.  Chest x-ray shows concern for multifocal pneumonia.  CT abdomen pelvis ordered given abdominal tenderness on exam shows small bilateral pleural effusions with multiple adjacent past Cheek air space opacities concerning for multifocal pneumonia.  Ordered Rocephin, azithromycin, and Unasyn as ordered for concern for aspiration pneumonia.  Given 1 L of lactated Ringer's initially.  Suspect his lactic acidosis may also be secondary to dehydration, he is also DNI and BP stable and ordered 1L and not full 30cc/kg fluid.  Admitted for further care.           Final Clinical Impression(s) / ED Diagnoses Final diagnoses:  Multifocal pneumonia  Leukocytosis, unspecified type  Lactic acid acidosis    Rx / DC Orders ED Discharge Orders     None         Alvira Monday, MD 07/20/23 [phone]

## 2023-07-19 NOTE — ED Notes (Signed)
 Patient transported to CT

## 2023-07-20 ENCOUNTER — Inpatient Hospital Stay (HOSPITAL_COMMUNITY): Payer: Non-veteran care

## 2023-07-20 DIAGNOSIS — R0609 Other forms of dyspnea: Secondary | ICD-10-CM

## 2023-07-20 DIAGNOSIS — J690 Pneumonitis due to inhalation of food and vomit: Secondary | ICD-10-CM | POA: Diagnosis present

## 2023-07-20 DIAGNOSIS — J189 Pneumonia, unspecified organism: Secondary | ICD-10-CM | POA: Diagnosis not present

## 2023-07-20 LAB — COMPREHENSIVE METABOLIC PANEL
ALT: 16 U/L (ref 0–44)
AST: 27 U/L (ref 15–41)
Albumin: 2.4 g/dL — ABNORMAL LOW (ref 3.5–5.0)
Alkaline Phosphatase: 99 U/L (ref 38–126)
Anion gap: 13 (ref 5–15)
BUN: 25 mg/dL — ABNORMAL HIGH (ref 8–23)
CO2: 22 mmol/L (ref 22–32)
Calcium: 9.8 mg/dL (ref 8.9–10.3)
Chloride: 107 mmol/L (ref 98–111)
Creatinine, Ser: 0.67 mg/dL (ref 0.61–1.24)
GFR, Estimated: >60 mL/min (ref >60)
Glucose, Bld: 80 mg/dL (ref 70–99)
Potassium: 3.6 mmol/L (ref 3.5–5.1)
Sodium: 142 mmol/L (ref 135–145)
Total Bilirubin: 1.4 mg/dL — ABNORMAL HIGH (ref 0.0–1.2)
Total Protein: 6.4 g/dL — ABNORMAL LOW (ref 6.5–8.1)

## 2023-07-20 LAB — ECHOCARDIOGRAM COMPLETE
AR max vel: 3.36 cm2
AV Area VTI: 3.11 cm2
AV Area mean vel: 3.15 cm2
AV Mean grad: 1 mm[Hg]
AV Peak grad: 2.3 mm[Hg]
Ao pk vel: 0.76 m/s
Area-P 1/2: 4.18 cm2
Height: 71 in
MV VTI: 2.32 cm2
S' Lateral: 2.4 cm
Single Plane A4C EF: 73.2 %
Weight: 2257.51 [oz_av]

## 2023-07-20 LAB — CBC
HCT: 36 % — ABNORMAL LOW (ref 39.0–52.0)
Hemoglobin: 11.8 g/dL — ABNORMAL LOW (ref 13.0–17.0)
MCH: 35.6 pg — ABNORMAL HIGH (ref 26.0–34.0)
MCHC: 32.8 g/dL (ref 30.0–36.0)
MCV: 108.8 fL — ABNORMAL HIGH (ref 80.0–100.0)
Platelets: 217 10*3/uL (ref 150–400)
RBC: 3.31 MIL/uL — ABNORMAL LOW (ref 4.22–5.81)
RDW: 13.7 % (ref 11.5–15.5)
WBC: 25.1 10*3/uL — ABNORMAL HIGH (ref 4.0–10.5)
nRBC: 0 % (ref 0.0–0.2)

## 2023-07-20 LAB — PHOSPHORUS: Phosphorus: 2.2 mg/dL — ABNORMAL LOW (ref 2.5–4.6)

## 2023-07-20 LAB — URINALYSIS, W/ REFLEX TO CULTURE (INFECTION SUSPECTED)
Bacteria, UA: NONE SEEN
Bilirubin Urine: NEGATIVE
Glucose, UA: NEGATIVE mg/dL
Hgb urine dipstick: NEGATIVE
Ketones, ur: NEGATIVE mg/dL
Leukocytes,Ua: NEGATIVE
Nitrite: NEGATIVE
Protein, ur: NEGATIVE mg/dL
Specific Gravity, Urine: 1.043 — ABNORMAL HIGH (ref 1.005–1.030)
pH: 5 (ref 5.0–8.0)

## 2023-07-20 LAB — PROTIME-INR
INR: 1.4 — ABNORMAL HIGH (ref 0.8–1.2)
Prothrombin Time: 17.8 s — ABNORMAL HIGH (ref 11.4–15.2)

## 2023-07-20 LAB — MAGNESIUM: Magnesium: 1.5 mg/dL — ABNORMAL LOW (ref 1.7–2.4)

## 2023-07-20 LAB — STREP PNEUMONIAE URINARY ANTIGEN: Strep Pneumo Urinary Antigen: NEGATIVE

## 2023-07-20 LAB — TSH: TSH: 2.102 u[IU]/mL (ref 0.350–4.500)

## 2023-07-20 LAB — LACTIC ACID, PLASMA: Lactic Acid, Venous: 2.4 mmol/L (ref 0.5–1.9)

## 2023-07-20 MED ORDER — POTASSIUM PHOSPHATES 15 MMOLE/5ML IV SOLN: 30.0000 mmol | Freq: Once | INTRAVENOUS | Status: DC

## 2023-07-20 MED ORDER — POTASSIUM PHOSPHATES 15 MMOLE/5ML IV SOLN
30.0000 mmol | Freq: Once | INTRAVENOUS | Status: AC
  Administered 2023-07-20: 30 mmol via INTRAVENOUS
  Filled 2023-07-20: qty 10

## 2023-07-20 MED ORDER — MAGNESIUM SULFATE 2 GM/50ML IV SOLN
2.0000 g | Freq: Once | INTRAVENOUS | Status: AC
  Administered 2023-07-20: 2 g via INTRAVENOUS
  Filled 2023-07-20: qty 50

## 2023-07-20 MED ORDER — PERFLUTREN LIPID MICROSPHERE
1.0000 mL | INTRAVENOUS | Status: AC | PRN
Start: 2023-07-20 — End: 2023-07-20
  Administered 2023-07-20: 3 mL via INTRAVENOUS

## 2023-07-20 MED ORDER — CHLORHEXIDINE GLUCONATE CLOTH 2 % EX PADS
6.0000 | MEDICATED_PAD | Freq: Every day | CUTANEOUS | Status: DC
  Administered 2023-07-20 – 2023-07-23 (×4): 6 via TOPICAL

## 2023-07-20 NOTE — Progress Notes (Signed)
After further review by Dr. Milford Cage, patient has evidence of leukocytosis. IR will hold off on gastrostomy tube placement until WBC is trending down. Family and care team notified.

## 2023-07-20 NOTE — ED Notes (Signed)
Pt becoming agitated when getting morning labs

## 2023-07-20 NOTE — Progress Notes (Signed)
SLP Cancellation Note  Patient Details Name: Craig Snow. 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    Please note, pt aspirated secretions on MBS 05/2022 - and suspect his dysphagia has progressed since that time with worsening secretion aspiration.   Chales Abrahams 07/20/2023, 7:51 AM

## 2023-07-20 NOTE — H&P (Signed)
Chief Complaint: Patient was seen in consultation today for failure to thrive,   at the request of Joycelyn Das, MD.  Referring Physician(s): Pokhrel, Rebekah Chesterfield, MD  Supervising Physician: Roanna Banning  Patient Status: [facility] - In-pt  History of Present Illness: Craig Landwehr. is a 89 y.o. male with history of a fib, GERD, HTN, HLD, bladder cancer, BPH. Patient presented to the ER 07/19/23 with c/o SOB, decreased appetite, cough/congestion and weakness for 1-2 weeks. Upon presentation, patient was afebrile and blood pressure was in normal limits. BUN was elevated. Chest xray 07/19/23 showed evidence of multifocal opacities. CT abdomen was performed due to low oral intake. CT abd/pelvis 07/19/23 reported small bilateral pleural effusions and multiple airspace opacities which was concerning for multifocal pneumonia. Patient was subsequently admitted for further evaluation. Of note, patient was scheduled to have a G tube placed 07/26/23 for nutritional deficiency.   Patient is not oriented and history was obtained from his son, Craig Snow. Patient's son reports having a recurrence of aspiration pneumonia. The patient has had poor oral intake for a few weeks, but it was worse this past week. Patient was scheduled to have a gastrostomy tube placed 1/22/ 25 with our IR team, but he presented to the hospital yesterday. Patient's son reports that his father wanted to have a feeding tube placed.    Past Medical History:  Diagnosis Date   Acquired thrombophilia (HCC)    Acute lower UTI 07/30/2021   Arrhythmia    Atrial fibrillation (HCC)    Bladder cancer (HCC)    BPH (benign prostatic hyperplasia)    Candidiasis of penis 07/30/2021   Chronic ear infection, left    Community acquired pneumonia 07/30/2021   aspiration   GERD (gastroesophageal reflux disease)    Headache    History of radiation therapy    oral cancer   Hyperlipidemia    Hypertension    Indwelling Foley catheter present     Neoplasm of palate 2008   Poor dentition    Ruptured eardrum, left    absent eardrum   Supratherapeutic INR 07/30/2021   Thyrotoxicosis     Past Surgical History:  Procedure Laterality Date   ANTERIOR APPROACH HEMI HIP ARTHROPLASTY Left 10/23/2021   Procedure: ANTERIOR APPROACH HEMI HIP ARTHROPLASTY;  Surgeon: Samson Frederic, MD;  Location: [facility] OR;  Service: Orthopedics;  Laterality: Left;   INCISION AND DRAINAGE OF WOUND Left 04/13/2022   Procedure: IRRIGATION AND DEBRIDEMENT SUPERFICIAL HIP;  Surgeon: Samson Frederic, MD;  Location: [facility] ORS;  Service: Orthopedics;  Laterality: Left;  120   MOUTH SURGERY     cancer    Allergies: Aspirin, Ibuprofen, Clindamycin/lincomycin, Fluconazole, Other, Sulfa antibiotics, and Tramadol  Medications: Prior to Admission medications   Medication Sig Start Date End Date Taking? Authorizing Provider  acetaminophen (TYLENOL) 650 MG CR tablet Take 650-1,300 mg by mouth every 8 (eight) hours as needed for pain.   Yes [provider]  benzonatate (TESSALON) 100 MG capsule Take 100-200 mg by mouth 3 (three) times daily as needed for cough. 07/13/23  Yes [provider]  busPIRone (BUSPAR) 5 MG tablet Take 5 mg by mouth 2 (two) times daily. 06/14/23  Yes [provider]  furosemide (LASIX) 20 MG tablet Take 20 mg by mouth at bedtime.   Yes [provider]  haloperidol (HALDOL) 0.5 MG tablet Take 0.5-1 mg by mouth 3 (three) times daily as needed for agitation. 07/17/23  Yes [provider]  levothyroxine (SYNTHROID) 50 MCG  tablet Take 50 mcg by mouth every morning.   Yes [provider]  liothyronine (CYTOMEL) 5 MCG tablet Take 5 mcg by mouth every morning.   Yes [provider]  Melatonin 5 MG CAPS Take 10 mg by mouth at bedtime.   Yes [provider]  metoprolol tartrate (LOPRESSOR) 25 MG tablet Take 1 tablet (25 mg total) by mouth 2 (two) times daily. 08/06/21  Yes Willeen Niece, MD   mirtazapine (REMERON) 15 MG tablet Take 15 mg by mouth at bedtime.   Yes [provider]  nystatin (MYCOSTATIN) 100000 UNIT/ML suspension Take 4-5 mLs by mouth 4 (four) times daily. 07/10/23  Yes [provider]  potassium chloride (KLOR-CON) 10 MEQ tablet Take 10 mEq by mouth daily.   Yes [provider]  traMADol (ULTRAM) 50 MG tablet Take 25 mg by mouth daily as needed for severe pain.   Yes [provider]  atorvastatin (LIPITOR) 10 MG tablet Take 10 mg by mouth at bedtime. Patient not taking: Reported on 07/19/2023    [provider]     Family History  Problem Relation Age of Onset   Cancer Father     Social History   Socioeconomic History   Marital status: Widowed    Spouse name: Not on file   Number of children: Not on file   Years of education: Not on file   Highest education level: Not on file  Occupational History   Not on file  Tobacco Use   Smoking status: Former    Current packs/day: 0.00    Types: Cigarettes    Quit date: 45    Years since quitting: 52.0   Smokeless tobacco: Never  Vaping Use   Vaping status: Never Used  Substance and Sexual Activity   Alcohol use: Not Currently    Comment: rare   Drug use: Not Currently   Sexual activity: Not on file  Other Topics Concern   Not on file  Social History Narrative   Not on file   Social Drivers of Health   Financial Resource Strain: Not on file  Food Insecurity: No Food Insecurity (04/25/2023)   Hunger Vital Sign    Worried About Running Out of Food in the Last Year: Never true    Ran Out of Food in the Last Year: Never true  Transportation Needs: No Transportation Needs (04/25/2023)   PRAPARE - Administrator, Civil Service (Medical): No    Lack of Transportation (Non-Medical): No  Physical Activity: Not on file  Stress: Not on file  Social Connections: Not on file    Review of Systems: A 12 point ROS discussed and pertinent positives are  indicated in the HPI above.  All other systems are negative.  Review of Systems  Reason unable to perform ROS: patient is not oriented.    Vital Signs: BP (!) 101/53   Pulse 90   Temp 98.9 F (37.2 C) (Axillary)   Resp 16   Ht 5\' 11"  (1.803 m)   Wt 141 lb 1.5 oz (64 kg)   SpO2 92%   BMI 19.68 kg/m   Advance Care Plan: The advanced care plan/surrogate decision maker was discussed at the time of visit and documented in the medical record.    Physical Exam Cardiovascular:     Rate and Rhythm: Normal rate and regular rhythm.  Pulmonary:     Breath sounds: Normal breath sounds.  Abdominal:     General: There is no  distension.  Skin:    General: Skin is warm.  Neurological:     Comments: Patient is not oriented and not verbal.      Imaging: CT ABDOMEN PELVIS W CONTRAST Result Date: 07/19/2023 CLINICAL DATA:  Weakness.  Shortness of breath. EXAM: CT ABDOMEN AND PELVIS WITH CONTRAST TECHNIQUE: Multidetector CT imaging of the abdomen and pelvis was performed using the standard protocol following bolus administration of intravenous contrast. RADIATION DOSE REDUCTION: This exam was performed according to the departmental dose-optimization program which includes automated exposure control, adjustment of the mA and/or kV according to patient size and/or use of iterative reconstruction technique. CONTRAST:  75mL OMNIPAQUE IOHEXOL 300 MG/ML  SOLN COMPARISON:  January 28, 2022. FINDINGS: Lower chest: Small bilateral pleural effusions are noted with multiple adjacent patchy airspace opacities concerning for pneumonia. Hepatobiliary: No focal liver abnormality is seen. No gallstones, gallbladder wall thickening, or biliary dilatation. Pancreas: Unremarkable. No pancreatic ductal dilatation or surrounding inflammatory changes. Spleen: Normal in size without focal abnormality. Adrenals/Urinary Tract: Adrenal glands and kidneys are unremarkable. No hydronephrosis or renal obstruction. Urinary bladder is  not visualized due to scatter artifact arising from bilateral hip arthroplasties. Stomach/Bowel: Stomach is within normal limits. Appendix appears normal. No evidence of bowel wall thickening, distention, or inflammatory changes. Vascular/Lymphatic: Aortic atherosclerosis. No enlarged abdominal or pelvic lymph nodes. Reproductive: Mild prostatic enlargement. Other: No ascites or hernia. Musculoskeletal: Status post bilateral hip arthroplasties. No acute osseous abnormality. IMPRESSION: Small bilateral pleural effusions are noted with multiple adjacent patchy airspace opacities concerning for multifocal pneumonia. Urinary bladder is not visualized due to scatter artifact arising from bilateral hip arthroplasties. Mild prostatic enlargement. Aortic Atherosclerosis (ICD10-I70.0). Electronically Signed   By: Lupita Raider M.D.   On: 07/19/2023 19:26   DG Chest Portable 1 View Result Date: 07/19/2023 CLINICAL DATA:  Shortness of breath. EXAM: PORTABLE CHEST 1 VIEW COMPARISON:  06/20/2023 FINDINGS: The heart is normal in size. Aortic atherosclerosis. Increasing patchy airspace disease at the right and left lung base. Increasing right upper lobe opacity abutting the fissure. There may be small pleural effusions. No pulmonary edema. No pneumothorax. IMPRESSION: Multifocal patchy opacities with worsening over the last month. This may represent persistent or recurrent pneumonia. Aspiration is considered. Electronically Signed   By: Narda Rutherford M.D.   On: 07/19/2023 18:05    Labs:  CBC: Recent Labs    04/27/23 0827 06/20/23 1644 07/19/23 1806 07/19/23 1812 07/20/23 0502  WBC 8.3 15.7* 21.0*  --  25.1*  HGB 12.5* 12.6* 13.2 14.3 11.8*  HCT 38.9* 37.6* 42.2 42.0 36.0*  PLT 156 195 208  --  217    COAGS: Recent Labs    04/25/23 0004  INR 1.1    BMP: Recent Labs    04/27/23 0827 06/20/23 1644 07/19/23 1806 07/19/23 1812 07/20/23 0502  NA 143 137 137 141 142  K 3.9 3.9 3.8 3.7 3.6  CL  108 103 103 104 107  CO2 17* 24 24  --  22  GLUCOSE 30* 102* 143* 138* 80  BUN 26* 27* 34* 31* 25*  CALCIUM 9.7 9.6 10.0  --  9.8  CREATININE 1.04 0.78 0.77 0.90 0.67  GFRNONAA >60 >60 >60  --  >60    LIVER FUNCTION TESTS: Recent Labs    04/24/23 2025 04/25/23 0539 07/19/23 1806 07/20/23 0502  BILITOT 1.0 1.1 1.1 1.4*  AST 30 26 27 27   ALT 21 19 19 16   ALKPHOS 97 88 113 99  PROT 7.5 7.1  7.0 6.4*  ALBUMIN 4.2 3.8 2.8* 2.4*    TUMOR MARKERS: No results for input(s): "AFPTM", "CEA", "CA199", "CHROMGRNA" in the last 8760 hours.  Assessment and Plan:  Angelis Tomich. is a 89 y.o. male with history of a fib, GERD, HTN, HLD, bladder cancer, BPH. Patient presented to the ER 07/19/23 with c/o SOB, decreased appetite, cough/congestion and weakness for 1-2 weeks. Upon presentation, patient was afebrile and blood pressure was in normal limits. BUN was elevated. Chest xray 07/19/23 showed evidence of multifocal opacities. CT abdomen was performed due to low oral intake. CT abd/pelvis 07/19/23 reported small bilateral pleural effusions and multiple airspace opacities which was concerning for multifocal pneumonia. Patient was subsequently admitted for further evaluation. Of note, patient was scheduled to have a G tube placed 07/26/23 for nutritional deficiency.   Patient is not oriented and history was obtained from his son, Craig Snow. Patient's son reports having a recurrence of aspiration pneumonia. The patient has had poor oral intake for a few weeks, but it was worse this past week. Patient was scheduled to have a gastrostomy tube placed 1/22/ 25 with our IR team, but he presented to the hospital yesterday. Patient's son reports that his father wanted to have a feeding tube placed.   Risks and benefits image guided gastrostomy tube placement was discussed with the patient including, but not limited to the need for a barium enema during the procedure, bleeding, infection, peritonitis and/or  damage to adjacent structures.  All of the patient's questions were answered, patient is agreeable to proceed.  Consent signed and in chart.  Thank you for this interesting consult.  I greatly enjoyed meeting Craig Vanneste. and look forward to participating in their care.  A copy of this report was sent to the requesting provider on this date.  Electronically Signed: Rosalita Levan, PA 07/20/2023, 11:15 AM   I spent a total of 20 Minutes    in face to face in clinical consultation, greater than 50% of which was counseling/coordinating care for failure to thrive

## 2023-07-20 NOTE — Progress Notes (Signed)
Progress Note   Patient: Craig Snow. VWU:[id number] DOB: 12/17/1920 DOA: 07/19/2023     1 DOS: the patient was seen and examined on 07/20/2023   Brief hospital course: 89yo with h/o afib not on AC, HLD, bladder CA, BPH, and HTN who presented on 1/15 with cough and SOB.  He has known aspiration, is planning to undergo PEG tube placement on 1/22.  CXR with multifocal PNA, likely associated with continued aspiration.  Assessment and Plan:  Multifocal pneumonia, likely related to aspiration pneumonia Patient with known h/o chronic aspiration Scheduled for PEG tube placement (see below), but had worsening cough and SOB, really unable to take PO Significant leukocytosis present with lactic acid elevation to 3.1 Chest x-ray showed multifocal infiltrates Received Rocephin and Zithromax -> Unasyn given concern for aspiration pneumonia Follow blood cultures Speech swallow evaluation ordered but canceled - his aspiration issue is well known Supplemental oxygen as needed Continue incentive spirometry Was treated for pneumonia in the past but has not completely resolved likely secondary to ongoing aspiration  Dysphagia MBS in 05/2022 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 Likely with worsening known chronic aspiration Discussion by admitting MD and myself with the son discouraging feeding tube placement His son reports that the patient is not ready to give up and wants the feeding tube despite the fact that it is unlikely to help and likely to cause more harm This plan was already in place and IR consult requested Given current leukocytosis, IR would like to defer for now In the meantime, I will request palliative care consult for Ethics consult  Elevated BNP Echo ordered by admitting physician Preserved EF, mild pulmonary HTN   Atrial fibrillation Rate controlled with metoprolol Not on AC   Failure to thrive, poor oral intake, elevated BUN  Continue  mirtazepine  Will hold Lasix, patient was not taking at home either Nutrition consulted PT/OT consults Goals of care need to be reconsidered - palliative care/ethics consulted   Hypothyroidism   Continue Synthroid and Cytomel Normal TSH   Hyperlipidemia On Lipitor at home but not taking it Will stop   History of bladder cancer and BPH Has had chronic indwelling Foley catheter for urinary retention for 2 years  Follows with [facility] No active issues at this time   Essential hypertension Continue metoprolol   Anxiety, agitation Continue buspirone, Haldol and mirtazepine (Haldol changed to prn agitation)   Goals of care Admitting physician discussed goals of care with the patient's son at bedside and I also discussed with him by telephone this AM   Patient's son states that patient had wishes for feeding tube placement Patient has had follow-up with palliative care and hospice as outpatient and family did not find this helpful Palliative care consulted for ethics assistance Lives at home with family and full-time caregivers, anticipated to return to that environment  DNR Confirmed on admission    Consultants: IR Palliative care Nutrition PT OT  Procedures: None  Antibiotics: Azithromycin x 1 Ceftriaxone x 1 Unasyn 1/15-  30 Day Unplanned Readmission Risk Score    Flowsheet Row ED to Hosp-Admission (Current) from 07/19/2023 in [facility] Emergency Department at [facility]  30 Day Unplanned Readmission Risk Score (%) 22.33 Filed at 07/20/2023 0801       This score is the patient's risk of an unplanned readmission within 30 days of being discharged (0 -100%). The score is based on dignosis, age, lab data, medications, orders, and past  utilization.   Low:  0-14.9   Medium: 15-21.9   High: 22-29.9   Extreme: 30 and above            Subjective: Patient is unable to answer questions effectively.  I He has expressed a firm desire to get a  feeding tube put in.  It is very difficult for him to eat, getting aspiration PNA repeatedly.  He has an appointment on 1/22 but he suddenly got worse.  He looked pretty bad yesterday.  They are aware that he is likely to continue to aspirate post-tube.  Palliative care has consulted, "not very impressed."  They consulted hospice, not very helpful.  They have caregivers at home.    Physical Exam: Vitals:   07/20/23 1004 07/20/23 1029 07/20/23 1300 07/20/23 1454  BP: (!) 101/53  108/65 135/75  Pulse: 90  91 (!) 106  Resp: 16  16 14   Temp:  98.9 F (37.2 C)  98 F (36.7 C)  TempSrc:  Axillary  Axillary  SpO2: 92%  99% (!) 76%  Weight:      Height:         No intake or output data in the 24 hours ending 07/20/23 1715 Filed Weights   07/19/23 1534  Weight: 64 kg    Exam:  General:  Appears frail, cachectic, overall miserable, mostly nonverbal at this time Eyes:   normal lids, mostly closed ENT:  hard of hearing, dry lips & tongue, moderately dry mm Neck:  no LAD, masses or thyromegaly Cardiovascular:  RRR No LE edema.  Respiratory:   Diffuse coarse rhonchi.  Normal to mildly increased respiratory effort. Abdomen:  soft, NT, ND, scaphoid Skin:  no rash or induration seen on limited exam Musculoskeletal:  no bony abnormality Psychiatric:  blunted mood and affect, speech minimal Neurologic:  unable to effectively perform  Data Reviewed: I have reviewed the patient's lab results since admission.  Pertinent labs for today include:   Phos 2.2 Mag++ 1.5 Bili 1.4 Lactate 3.1, 2.4 WBC 25.1 Hgb 11.8 TSH 2.102    Family Communication: I spoke with a caretaker at bedside and his son by telephone  Disposition: Status is: Inpatient Remains inpatient appropriate because: ongoing evaluation and management  Planned Discharge Destination: Home    Time spent: 50 minutes  Author: Jonah Blue, MD 07/20/2023 5:15 PM  For on call review [email or web address].

## 2023-07-21 DIAGNOSIS — D72829 Elevated white blood cell count, unspecified: Secondary | ICD-10-CM | POA: Diagnosis not present

## 2023-07-21 DIAGNOSIS — J690 Pneumonitis due to inhalation of food and vomit: Secondary | ICD-10-CM

## 2023-07-21 DIAGNOSIS — Z8551 Personal history of malignant neoplasm of bladder: Secondary | ICD-10-CM

## 2023-07-21 DIAGNOSIS — R4589 Other symptoms and signs involving emotional state: Secondary | ICD-10-CM

## 2023-07-21 DIAGNOSIS — Z7189 Other specified counseling: Secondary | ICD-10-CM

## 2023-07-21 DIAGNOSIS — Z515 Encounter for palliative care: Secondary | ICD-10-CM

## 2023-07-21 DIAGNOSIS — J189 Pneumonia, unspecified organism: Secondary | ICD-10-CM | POA: Diagnosis not present

## 2023-07-21 DIAGNOSIS — R6889 Other general symptoms and signs: Secondary | ICD-10-CM

## 2023-07-21 LAB — CBC WITH DIFFERENTIAL/PLATELET
Abs Immature Granulocytes: 0.14 10*3/uL — ABNORMAL HIGH (ref 0.00–0.07)
Basophils Absolute: 0 10*3/uL (ref 0.0–0.1)
Basophils Relative: 0 %
Eosinophils Absolute: 0 10*3/uL (ref 0.0–0.5)
Eosinophils Relative: 0 %
HCT: 42.4 % (ref 39.0–52.0)
Hemoglobin: 13.3 g/dL (ref 13.0–17.0)
Immature Granulocytes: 1 %
Lymphocytes Relative: 5 %
Lymphs Abs: 0.9 10*3/uL (ref 0.7–4.0)
MCH: 34.7 pg — ABNORMAL HIGH (ref 26.0–34.0)
MCHC: 31.4 g/dL (ref 30.0–36.0)
MCV: 110.7 fL — ABNORMAL HIGH (ref 80.0–100.0)
Monocytes Absolute: 0.6 10*3/uL (ref 0.1–1.0)
Monocytes Relative: 3 %
Neutro Abs: 18.6 10*3/uL — ABNORMAL HIGH (ref 1.7–7.7)
Neutrophils Relative %: 91 %
Platelets: 254 10*3/uL (ref 150–400)
RBC: 3.83 MIL/uL — ABNORMAL LOW (ref 4.22–5.81)
RDW: 14 % (ref 11.5–15.5)
WBC: 20.3 10*3/uL — ABNORMAL HIGH (ref 4.0–10.5)
nRBC: 0 % (ref 0.0–0.2)

## 2023-07-21 LAB — BASIC METABOLIC PANEL
Anion gap: 13 (ref 5–15)
BUN: 28 mg/dL — ABNORMAL HIGH (ref 8–23)
CO2: 19 mmol/L — ABNORMAL LOW (ref 22–32)
Calcium: 9.6 mg/dL (ref 8.9–10.3)
Chloride: 114 mmol/L — ABNORMAL HIGH (ref 98–111)
Creatinine, Ser: 0.86 mg/dL (ref 0.61–1.24)
GFR, Estimated: >60 mL/min (ref >60)
Glucose, Bld: 111 mg/dL — ABNORMAL HIGH (ref 70–99)
Potassium: 3.8 mmol/L (ref 3.5–5.1)
Sodium: 146 mmol/L — ABNORMAL HIGH (ref 135–145)

## 2023-07-21 MED ORDER — SODIUM CHLORIDE 0.9 % IV SOLN
3.0000 g | Freq: Four times a day (QID) | INTRAVENOUS | Status: DC
  Administered 2023-07-21 – 2023-07-23 (×7): 3 g via INTRAVENOUS
  Filled 2023-07-21 (×10): qty 8

## 2023-07-21 MED ORDER — SCOPOLAMINE 1 MG/3DAYS TD PT72
1.0000 | MEDICATED_PATCH | TRANSDERMAL | Status: DC
  Administered 2023-07-21: 1.5 mg via TRANSDERMAL
  Filled 2023-07-21: qty 1

## 2023-07-21 NOTE — Evaluation (Signed)
Physical Therapy Evaluation Patient Details Name: Craig Snow. MRN: [id number] DOB: 16-Oct-1920 Today's Date: 07/21/2023  History of Present Illness  Craig Snow. is an 89 y.o. male who presented to hospital with cough congestion and shortness of breath with decreased appetite and increasing generalized weakness. Pt admitted with multifocal pneumonia. PMH: afib, GERD, hyperlipidemia, history of bladder cancer and BPH, HTN  Clinical Impression  Pt admitted with above diagnosis. Pt from home with family and PCA assisting with mobility and self care as needed, using RW for ambulation in the home from bed to recliner or recliner to bathroom. Pt currently needing +2 A to come to sitting EOB, able to maintain static sitting balance with time and single UE support, max A+2 to power to stand with therapist blocking bil knees and pt maintaining kyphotic posture. Assisted pt back to supine with all needs in reach, son at bedside and MD entering room. Son at bedside reports pt appears intermittently confused when reading cue board for written communication and with verbal cues; ultimately needing increased time and cues with mobility. Anticipate return home with family and PCA assistance and recommend HHPT. Pt currently with functional limitations due to the deficits listed below (see PT Problem List). Pt will benefit from acute skilled PT to increase their independence and safety with mobility to allow discharge.            If plan is discharge home, recommend the following: A lot of help with walking and/or transfers;A lot of help with bathing/dressing/bathroom;Assistance with cooking/housework;Assist for transportation   Can travel by private vehicle        Equipment Recommendations None recommended by PT  Recommendations for Other Services       Functional Status Assessment Patient has had a recent decline in their functional status and demonstrates the ability to make significant  improvements in function in a reasonable and predictable amount of time.     Precautions / Restrictions Precautions Precautions: Fall Precaution Comments: monitor O2 and HR Restrictions Weight Bearing Restrictions Per Provider Order: No      Mobility  Bed Mobility Overal bed mobility: Needs Assistance Bed Mobility: Supine to Sit, Sit to Supine     Supine to sit: Max assist, +2 for physical assistance Sit to supine: Max assist, +2 for physical assistance   General bed mobility comments: pt attempts to pull on therapist's hands to pull trunk up into sitting, but only able to shift self down in bed, +2 with bedpad to transition pt to sitting EOB; once sitting EOB pt needing mod A to close supv wtih single UE support on bed to steady self; max A +2 to return to supine managing trunk and BLE with use of bedpad    Transfers Overall transfer level: Needs assistance Equipment used: 2 person hand held assist Transfers: Sit to/from Stand Sit to Stand: Max assist, +2 physical assistance           General transfer comment: max A+2 for STS at EOB, therapist blocking bil knees, pt's son at one side and therapist at other, unable to fully extend bil knees and fully rerect posture, maintains kyphotic posture    Ambulation/Gait                  Stairs            Wheelchair Mobility     Tilt Bed    Modified Rankin (Stroke Patients Only)       Balance Overall balance assessment:  Needs assistance Sitting-balance support: Feet supported, Single extremity supported Sitting balance-Leahy Scale: Poor     Standing balance support: During functional activity, Bilateral upper extremity supported Standing balance-Leahy Scale: Zero Standing balance comment: max A+2 for STS and maintain static stand                             Pertinent Vitals/Pain Pain Assessment Pain Assessment: Faces Faces Pain Scale: Hurts a little bit Pain Location: generalized with  movement Pain Descriptors / Indicators: Grimacing Pain Intervention(s): Limited activity within patient's tolerance, Monitored during session, Repositioned    Home Living Family/patient expects to be discharged to:: Private residence Living Arrangements: Children Available Help at Discharge: Family;Personal care attendant Type of Home: House Home Access: Level entry     Alternate Level Stairs-Number of Steps: flight with stair lift Home Layout: Two level Home Equipment: Rolling Walker (2 wheels) Additional Comments: Pt lives in converted basement, level entry sidewalk entry, stairlifts to go to higher level for showers and rest of home. Son reports PCA or son is with pt 24/7.    Prior Function Prior Level of Function : Needs assist             Mobility Comments: Pt using RW in the home with A from family and PCA, supv 24/7 ADLs Comments: PCA and family assisting as needed     Extremity/Trunk Assessment   Upper Extremity Assessment Upper Extremity Assessment: Defer to OT evaluation    Lower Extremity Assessment Lower Extremity Assessment: Generalized weakness    Cervical / Trunk Assessment Cervical / Trunk Assessment: Kyphotic  Communication   Communication Communication: Difficulty communicating thoughts/reduced clarity of speech;Difficulty following commands/understanding;Hearing impairment Following commands: Follows one step commands inconsistently;Follows one step commands with increased time Cueing Techniques: Verbal cues;Gestural cues;Tactile cues;Visual cues  Cognition Arousal: Alert Behavior During Therapy: Flat affect Overall Cognitive Status: Impaired/Different from baseline                                 General Comments: son at bedside reports pt appears confused at times needing increased time to read written communication/commands, states "nothing" and "what" during evaluation, max cues for mobility        General Comments General  comments (skin integrity, edema, etc.): Pt on 2L with SpO2 97% upon arrival, removed O2 and SpO2 95%; after transitioning to EOB unable to obtain good pleth SpO2 reading, unable to obtain fllor pleth SpO2 reading once back in supine, pt doesn't appear in distress; returned 2L O2    Exercises     Assessment/Plan    PT Assessment Patient needs continued PT services  PT Problem List Decreased strength;Decreased activity tolerance;Decreased balance;Decreased mobility;Decreased knowledge of use of DME;Decreased safety awareness;Pain       PT Treatment Interventions DME instruction;Gait training;Therapeutic activities;Functional mobility training;Therapeutic exercise;Balance training;Neuromuscular re-education;Patient/family education    PT Goals (Current goals can be found in the Care Plan section)  Acute Rehab PT Goals Patient Stated Goal: return home with family and PCA assisting PT Goal Formulation: With patient/family Time For Goal Achievement: 08/04/23 Potential to Achieve Goals: Fair    Frequency Min 1X/week     Co-evaluation               AM-PAC PT "6 Clicks" Mobility  Outcome Measure Help needed turning from your back to your side while in a flat bed without using bedrails?:  Total Help needed moving from lying on your back to sitting on the side of a flat bed without using bedrails?: Total Help needed moving to and from a bed to a chair (including a wheelchair)?: Total Help needed standing up from a chair using your arms (e.g., wheelchair or bedside chair)?: Total Help needed to walk in hospital room?: Total Help needed climbing 3-5 steps with a railing? : Total 6 Click Score: 6    End of Session Equipment Utilized During Treatment: Gait belt;Oxygen Activity Tolerance: Patient limited by fatigue Patient left: in bed;with call bell/phone within reach;with bed alarm set;with family/visitor present;Other (comment) (palliative MD) Nurse Communication: Mobility status PT  Visit Diagnosis: Muscle weakness (generalized) (M62.81);Unsteadiness on feet (R26.81);Other abnormalities of gait and mobility (R26.89)    Time: 6213-0865 PT Time Calculation (min) (ACUTE ONLY): 27 min   Charges:   PT Evaluation $PT Eval Moderate Complexity: 1 Mod PT Treatments $Therapeutic Activity: 8-22 mins PT General Charges $$ ACUTE PT VISIT: 1 Visit         Tori Arlander Gillen PT, DPT 07/21/23, 10:58 AM

## 2023-07-21 NOTE — Consult Note (Signed)
Consultation Note Date: 07/21/2023   Patient Name: Jerman Enamorado.  DOB: [birth year]  MRN: [id number]  Age / Sex: 89 y.o., male   PCP: Daisy Floro, MD Referring Physician: Jonah Blue, MD  Reason for Consultation: Establishing goals of care     Chief Complaint/History of Present Illness:   Patient is a 89 year old male with a past medical history of A-fib not on anticoagulation, hyperlipidemia, bladder cancer, BPH, and hypertension who was admitted on 07/19/2023 for management of cough and shortness of breath.  During hospitalization he was found to have multifocal pneumonia related to aspiration.  Patient had outpatient appointment planned for PEG tube placement.  Palliative medicine team consulted to assist with complex medical decision making.  Extensive review of EMR prior to presenting to bedside.  Discussed care with IDT throughout the day including hospitalist, RN, IR, and SLP.  Reviewed prior modified barium swallow and recent CT imaging personally.  Initially presented to bedside in the morning.  Patient's son, Elfredia Nevins", present at bedside.  Patient laying in bed.  Patient has had episodes of delirium and is incredibly hard of hearing despite hearing aid in place.  History obtained from son accordingly.  Spent time learning about patient's medical journey.  Patient was diagnosed 2 years ago with difficulty swallowing and was directed to get a PEG tube at that time.  Family noticed that patient was able to swallow and so did not pursue PEG tube noting that food brought patient joy.  Patient was having good quality of life at home with reading and engaging with family.  Up until recently patient had been able to participate in some ADLs and ambulate with assistance.  Patient was previously a Public affairs consultant and so his mind and interaction with others was incredibly important to him.  Acknowledged this.  With permission, able to spend time reviewing geriatric concerns regarding  patient's medical status.  Spent time reviewing imaging with son.  Spent time discussing concerns about silent aspiration.  Imaging had noted "dysphagia previously with retention.  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  This was all secondary to patient's previously received radiation.  Discussed progression of worsening swallowing in this setting would occur.  Spent time explaining that with saline aspiration, placing a feeding tube will not reverse the underlying cause of dysphagia.  In fact, would be greatly concerned about risk of worsening patient's aspiration due to pumping food into a tube that he can then reflux and aspirate on.  Son acknowledged this and noted that he did not acknowledge previously that there was silent aspiration.  Son now hesitant about feeding tube. Son spent time explaining that his wife is greatly supportive of feeding tube due to her past interactions with family.  His wife had a traumatic end-of-life experience with one of her family members who was on hospice who was "denied" food at the end of life and wife saw this as starving to death.  Again spent time normalizing normal processes of the body as it ages and changes.  Answered all questions as able.  Son planning to reach out to his wife.  Called back to bedside shortly thereafter to speak with Brett Canales.  Brett Canales provided me with his wife, Colin Broach, number.  Karen's contact number is [phone].  Brett Canales noted that Clydie Braun was currently in [place] taking care of another sick family member who was hospitalized.  Clydie Braun gave permission for this provider to reach  out.  Able to then go and call Clydie Braun.  Again spent time reviewing geriatric concerns related to patient's care.  Again explained concerns related to fibrosis and patient's musculature that is causing aspiration and how a feeding tube will not fix this.  I again expressed concerned that placing a feeding  tube will cause aspiration on tube feeds.  Spent time answering all questions as able.  Clydie Braun noted that her sister is a Human resources officer and had expressed concerns about patient not getting a feeding tube and did not have concerns about patient aspirating even on saliva.  Acknowledged this while also standing by concerns that patient is aspirating which has only worsened in setting of aging and decreased functional status.  Clydie Braun expressed wanting feeding tube so that patient could alternate between oral intake and receiving tube feeds that could be specially prepared by family to be more specific to the food he was already taking by mouth.  Again reviewed how feeding tubes are used and maintained.  Expressed concern that with feeding tube in place, patient's muscles will only continue to weaken.  Also need to consider patient's quality of life with food intake.  Expressed concern that not allowing patient to have oral intake, would not be good quality of life.  Noted would encourage patient to have a diet and eat as he felt.  Normalized end-of-life care and how body decreases its ability to process nutrition in setting of multiple hormone and muscular changes as aging occurs.  Clydie Braun acknowledged to this.  Clydie Braun acknowledged that a feeding tube would be a "[employer]".  Expressed concern of what distress this could cause to patient and son who does not want patient to get a feeding tube at this point.  Clydie Braun acknowledges this.  Clydie Braun noted that while she still opts for feeding tube, she supports Steve's decision to not proceed with feeding tube should that be what he decides.  Encouraged further discussion between the 2.  During this conversation Clydie Braun did inform this provider that Brett Canales is not the healthcare power of attorney.  Patient's son Theodoro Grist in [place] is patient's healthcare power of attorney.  They have had just told [facility] to do as they see fit since they are the ones taking care of patient.   Noted would discussed this with Brett Canales.  Presented back to bedside later on to discuss conversation with Brett Canales that occurred with Clydie Braun.  Brett Canales noted he had spoke to [place].  Brett Canales again voices hearing that a feeding tube is not going to ultimately help patient's quality of life and instead could hasten death by causing worsening aspiration risk and infection.  Discussed at this time balancing allowing patient to eat for comfort.  Discussed appropriate management would be allowing dysphagia diet while having medications available if patient needs for shortness of breath or secretions.  Discussed addition of scopolamine patch at this time to preemptively manage and Brett Canales is agreeing with addition of this.  Brett Canales agrees with proceeding with dysphagia diet with feeder assistance to determine if patient will be able to eat.  Brett Canales also acknowledged that essentially patient is at the point of being a hospice patient.  Acknowledged this and discussed that patient could realistically be at the end of life.  Normalized deterioration at the end of life.  Brett Canales expressed concern that his wife could not emotionally handle patient being at home dying.  With permission, introduced the concept of hospice at other locations.  Family would not want patient  in a skilled nursing facility.  Discussed inpatient hospice as an option.  Specifically stated that 1 would need to be excepted to inpatient hospice after referral, cannot just go there.  Brett Canales acknowledged this.  Developed plan for today will be to continue with appropriate medical interventions such as IV fluids, lab work, and antibiotics.  Will allow patient to eat as willing.  Not pursuing feeding tube at this time.  Should patient deteriorate, would need to consider referral to inpatient hospice.  During conversation, Brett Canales able to call his brother Theodoro Grist who verified that he is patient's healthcare power of attorney.  Theodoro Grist gave verbal permission for Brett Canales to make medical  decisions on patient's behalf.  They have supporting of plan for essentially hospice evaluation while continuing appropriate medical interventions at this time.  Answered all questions as able and noted palliative medicine team will continue to follow along with patient's medical journey.  Updated IDT regarding discussions throughout the day.  Primary Diagnoses  Present on Admission:  Multifocal pneumonia  Aspiration pneumonia (HCC)  Hypothyroidism  HTN (hypertension)  HLD (hyperlipidemia)  Atrial fibrillation (HCC)   Past Medical History:  Diagnosis Date   Acquired thrombophilia (HCC)    Acute lower UTI 07/30/2021   Arrhythmia    Atrial fibrillation (HCC)    Bladder cancer (HCC)    BPH (benign prostatic hyperplasia)    Candidiasis of penis 07/30/2021   Chronic ear infection, left    Community acquired pneumonia 07/30/2021   aspiration   GERD (gastroesophageal reflux disease)    Headache    History of radiation therapy    oral cancer   Hyperlipidemia    Hypertension    Indwelling Foley catheter present    Neoplasm of palate 2008   Poor dentition    Ruptured eardrum, left    absent eardrum   Supratherapeutic INR 07/30/2021   Thyrotoxicosis    Social History   Socioeconomic History   Marital status: Widowed    Spouse name: Not on file   Number of children: Not on file   Years of education: Not on file   Highest education level: Not on file  Occupational History   Not on file  Tobacco Use   Smoking status: Former    Current packs/day: 0.00    Types: Cigarettes    Quit date: 42    Years since quitting: 52.0   Smokeless tobacco: Never  Vaping Use   Vaping status: Never Used  Substance and Sexual Activity   Alcohol use: Not Currently    Comment: rare   Drug use: Not Currently   Sexual activity: Not on file  Other Topics Concern   Not on file  Social History Narrative   Not on file   Social Drivers of Health   Financial Resource Strain: Not on file   Food Insecurity: No Food Insecurity (07/21/2023)   Hunger Vital Sign    Worried About Running Out of Food in the Last Year: Never true    Ran Out of Food in the Last Year: Never true  Transportation Needs: No Transportation Needs (07/21/2023)   PRAPARE - Administrator, Civil Service (Medical): No    Lack of Transportation (Non-Medical): No  Physical Activity: Not on file  Stress: Not on file  Social Connections: Socially Isolated (07/21/2023)   Social Connection and Isolation Panel [NHANES]    Frequency of Communication with Friends and Family: Twice a week    Frequency of Social Gatherings with Friends and  Family: More than three times a week    Attends Religious Services: Never    Active Member of Clubs or Organizations: No    Attends Banker Meetings: Never    Marital Status: Widowed   Family History  Problem Relation Age of Onset   Cancer Father    Scheduled Meds:  busPIRone  5 mg Oral BID   Chlorhexidine Gluconate Cloth  6 each Topical Daily   enoxaparin (LOVENOX) injection  40 mg Subcutaneous QHS   levothyroxine  50 mcg Oral QAC breakfast   liothyronine  5 mcg Oral QAC breakfast   melatonin  10 mg Oral QHS   methylPREDNISolone (SOLU-MEDROL) injection  40 mg Intravenous Q24H   metoprolol tartrate  25 mg Oral BID   mirtazapine  15 mg Oral QHS   nystatin  4-5 mL Oral QID   sodium chloride flush  3 mL Intravenous Q12H   Continuous Infusions:  ampicillin-sulbactam (UNASYN) IV 3 g (07/21/23 0530)   PRN Meds:.acetaminophen **OR** acetaminophen, guaiFENesin-dextromethorphan, haloperidol, haloperidol lactate, hydrALAZINE, ipratropium-albuterol, ondansetron **OR** ondansetron (ZOFRAN) IV, polyethylene glycol, sodium chloride flush Allergies  Allergen Reactions   Aspirin Other (See Comments)    Unknown reaction - listed on [facility] 07/30/21   Ibuprofen Anaphylaxis   Clindamycin/Lincomycin Other (See Comments)    Unknown reaction - listed on [facility] 07/30/21    Fluconazole Other (See Comments)    Hallucinations   Other Other (See Comments)    Unknown reaction to opioids - listed on [facility] 07/30/21 hallucinations   Sulfa Antibiotics Other (See Comments)    Unknown reaction - listed on [facility] 07/30/21   Tramadol Other (See Comments)    Hallucinations/ Currently taking for severe pain   CBC:    Component Value Date/Time   WBC 25.1 (H) 07/20/2023 0502   HGB 11.8 (L) 07/20/2023 0502   HCT 36.0 (L) 07/20/2023 0502   PLT 217 07/20/2023 0502   MCV 108.8 (H) 07/20/2023 0502   NEUTROABS 18.3 (H) 07/19/2023 1806   LYMPHSABS 1.3 07/19/2023 1806   MONOABS 1.2 (H) 07/19/2023 1806   EOSABS 0.0 07/19/2023 1806   BASOSABS 0.0 07/19/2023 1806   Comprehensive Metabolic Panel:    Component Value Date/Time   NA 142 07/20/2023 0502   K 3.6 07/20/2023 0502   CL 107 07/20/2023 0502   CO2 22 07/20/2023 0502   BUN 25 (H) 07/20/2023 0502   CREATININE 0.67 07/20/2023 0502   GLUCOSE 80 07/20/2023 0502   CALCIUM 9.8 07/20/2023 0502   AST 27 07/20/2023 0502   ALT 16 07/20/2023 0502   ALKPHOS 99 07/20/2023 0502   BILITOT 1.4 (H) 07/20/2023 0502   PROT 6.4 (L) 07/20/2023 0502   ALBUMIN 2.4 (L) 07/20/2023 0502    Physical Exam: Vital Signs: BP (!) 137/98 (BP Location: Left Arm)   Pulse 92   Temp (!) 97.5 F (36.4 C)   Resp 17   Ht 5\' 11"  (1.803 m)   Wt 60.7 kg   SpO2 94%   BMI 18.66 kg/m  SpO2: SpO2: 94 % O2 Device: O2 Device: Nasal Cannula O2 Flow Rate:   Intake/output summary:  Intake/Output Summary (Last 24 hours) at 07/21/2023 4166 Last data filed at 07/21/2023 0601 Gross per 24 hour  Intake 750 ml  Output --  Net 750 ml   LBM:   Baseline Weight: Weight: 64 kg Most recent weight: Weight: 60.7 kg  General: NAD, lethargic, chronically ill-appearing, frail, cachectic HENT: Dry mucous membranes Cardiovascular: RRR Respiratory: no increased work of  breathing noted, not in respiratory distress Neuro: Lethargic and confused at times, incredibly  hard of hearing          Palliative Performance Scale: 10%              Additional Data Reviewed: Recent Labs    07/19/23 1806 07/19/23 1812 07/20/23 0502  WBC 21.0*  --  25.1*  HGB 13.2 14.3 11.8*  PLT 208  --  217  NA 137 141 142  BUN 34* 31* 25*  CREATININE 0.77 0.90 0.67    Imaging: ECHOCARDIOGRAM COMPLETE    ECHOCARDIOGRAM REPORT       Patient Name:   Frasier Springborn. Date of Exam: 07/20/2023 Medical Rec #:  [id number]         Height:       71.0 in Accession #:    [id number]        Weight:       141.1 lb Date of Birth:  10/14/1920         BSA:          1.818 m Patient Age:    89 years         BP:           114/54 mmHg Patient Gender: M                 HR:           82 bpm. Exam Location:  Inpatient  Procedure: 2D Echo, Cardiac Doppler, Color Doppler and Intracardiac            Opacification Agent  Indications:    Dyspnea   History:        Patient has no prior history of Echocardiogram examinations.                 Arrythmias:Atrial Fibrillation; Risk Factors:Dyslipidemia and                 Hypertension.   Sonographer:    Vern Claude Referring Phys: [id number] DGUYQI POKHREL  IMPRESSIONS   1. Left ventricular ejection fraction, by estimation, is 55 to 60%. The left ventricle has normal function. The left ventricle has no regional wall motion abnormalities. Left ventricular diastolic parameters are indeterminate.  2. Right ventricular systolic function is normal. The right ventricular size is normal. There is mildly elevated pulmonary artery systolic pressure. The estimated right ventricular systolic pressure is 41.7 mmHg.  3. The mitral valve is normal in structure. No evidence of mitral valve regurgitation. No evidence of mitral stenosis.  4. The aortic valve is tricuspid. There is moderate calcification of the aortic valve. Aortic valve regurgitation is not visualized. Aortic valve sclerosis/calcification is present, without any evidence of aortic stenosis.  5.  The inferior vena cava is normal in size with greater than 50% respiratory variability, suggesting right atrial pressure of 3 mmHg.  6. The patient was in atrial fibrillation or flutter.  FINDINGS  Left Ventricle: Left ventricular ejection fraction, by estimation, is 55 to 60%. The left ventricle has normal function. The left ventricle has no regional wall motion abnormalities. The left ventricular internal cavity size was normal in size. There is  no left ventricular hypertrophy. Left ventricular diastolic parameters are indeterminate.  Right Ventricle: The right ventricular size is normal. No increase in right ventricular wall thickness. Right ventricular systolic function is normal. There is mildly elevated pulmonary artery systolic pressure. The tricuspid regurgitant velocity is 3.11  m/s, and with an assumed right  atrial pressure of 3 mmHg, the estimated right ventricular systolic pressure is 41.7 mmHg.  Left Atrium: Left atrial size was normal in size.  Right Atrium: Right atrial size was normal in size.  Pericardium: There is no evidence of pericardial effusion.  Mitral Valve: The mitral valve is normal in structure. Mild mitral annular calcification. No evidence of mitral valve regurgitation. No evidence of mitral valve stenosis. MV peak gradient, 2.6 mmHg. The mean mitral valve gradient is 1.0 mmHg.  Tricuspid Valve: The tricuspid valve is normal in structure. Tricuspid valve regurgitation is mild.  Aortic Valve: The aortic valve is tricuspid. There is moderate calcification of the aortic valve. Aortic valve regurgitation is not visualized. Aortic valve sclerosis/calcification is present, without any evidence of aortic stenosis. Aortic valve mean  gradient measures 1.0 mmHg. Aortic valve peak gradient measures 2.3 mmHg. Aortic valve area, by VTI measures 3.11 cm.  Pulmonic Valve: The pulmonic valve was normal in structure. Pulmonic valve regurgitation is not visualized.  Aorta: The  aortic root is normal in size and structure.  Venous: The inferior vena cava is normal in size with greater than 50% respiratory variability, suggesting right atrial pressure of 3 mmHg.  IAS/Shunts: No atrial level shunt detected by color flow Doppler.    LEFT VENTRICLE PLAX 2D LVIDd:         4.60 cm     Diastology LVIDs:         2.40 cm     LV e' medial:    11.10 cm/s LV PW:         0.70 cm     LV E/e' medial:  7.7 LV IVS:        0.80 cm     LV e' lateral:   11.90 cm/s LVOT diam:     2.30 cm     LV E/e' lateral: 7.2 LV SV:         43 LV SV Index:   24 LVOT Area:     4.15 cm   LV Volumes (MOD) LV vol d, MOD A4C: 84.2 ml LV vol s, MOD A4C: 22.6 ml LV SV MOD A4C:     84.2 ml  RIGHT VENTRICLE             IVC RV Basal diam:  3.90 cm     IVC diam: 1.50 cm RV Mid diam:    2.40 cm RV S prime:     11.10 cm/s TAPSE (M-mode): 1.3 cm  LEFT ATRIUM           Index        RIGHT ATRIUM          Index LA diam:      3.60 cm 1.98 cm/m   RA Area:     9.98 cm LA Vol (A4C): 50.6 ml 27.83 ml/m  RA Volume:   19.50 ml 10.73 ml/m  AORTIC VALVE                    PULMONIC VALVE AV Area (Vmax):    3.36 cm     PV Vmax:       0.81 m/s AV Area (Vmean):   3.15 cm     PV Peak grad:  2.6 mmHg AV Area (VTI):     3.11 cm AV Vmax:           76.40 cm/s AV Vmean:          54.500 cm/s AV VTI:  0.139 m AV Peak Grad:      2.3 mmHg AV Mean Grad:      1.0 mmHg LVOT Vmax:         61.70 cm/s LVOT Vmean:        41.300 cm/s LVOT VTI:          0.104 m LVOT/AV VTI ratio: 0.75   AORTA Ao Root diam: 3.70 cm Ao Asc diam:  3.70 cm  MITRAL VALVE               TRICUSPID VALVE MV Area (PHT): 4.18 cm    TR Peak grad:   38.7 mmHg MV Area VTI:   2.32 cm    TR Vmax:        311.00 cm/s MV Peak grad:  2.6 mmHg MV Mean grad:  1.0 mmHg    SHUNTS MV Vmax:       0.80 m/s    Systemic VTI:  0.10 m MV Vmean:      44.6 cm/s   Systemic Diam: 2.30 cm MV Decel Time: 182 msec MV E velocity: 85.90 cm/s  Dalton  McleanMD Electronically signed by Wilfred Lacy Signature Date/Time: 07/20/2023/2:35:20 PM      Final      I personally reviewed recent imaging.   Palliative Care Assessment and Plan Summary of Established Goals of Care and Medical Treatment Preferences   Patient is a 89 year old male with a past medical history of A-fib not on anticoagulation, hyperlipidemia, bladder cancer, BPH, and hypertension who was admitted on 07/19/2023 for management of cough and shortness of breath.  During hospitalization he was found to have multifocal pneumonia related to aspiration.  Patient had outpatient appointment planned for PEG tube placement.  Palliative medicine team consulted to assist with complex medical decision making.  # Complex medical decision making/goals of care  -Patient unable to participate in complex medical decision making secondary to medical status.   -Multiple extensive discussions with patient's son at bedside and patient's daughter-in-law, Clydie Braun, over the phone as detailed above in HPI.  At this time, we will continue with appropriate medical interventions including IV fluids, antibiotics, and lab work.  Brett Canales acknowledges that proceeding with feeding tube with patient's known silent aspiration would cause harm and potentially hasten death due to risk.  Will not proceed with feeding tube at this time.  Will allow patient to eat dysphagia diet with assistance as patient able and willing.Family acknowledging risk of aspiration though also wants patient to be able to eat and drink if he desires as family doesn't want patient to "starve to death".   -Will continue to monitor at this time with concerns about patient deteriorating.  Would provide medications for comfort if needed.  Family has expressed concern about being able to care for patient if came home with hospice.  If appropriate, patient may be more appropriate for inpatient hospice management.  Will continue to evaluate and discuss  base on interactions with family.  -Able to speak to patient's reported HCPOA, Garner Gavel, who agreed to deferring medical decisions making to Brett Canales since Brett Canales is here with patient and has been taking care of him for over a year.  -  Code Status: Limited: Do not attempt resuscitation (DNR) -DNR-LIMITED -Do Not Intubate/DNI    # Symptom management Patient is receiving these palliative interventions for symptom management with an intent to improve quality of life.   -Secretions   -Add scopolamine patch.  Discussed with son who agreed with addition of this  medication.  # Psycho-social/Spiritual Support:  - Support System: sons, DIL  # Discharge Planning:  To Be Determined  Thank you for allowing the palliative care team to participate in the care Arvilla Meres.Alvester Morin, DO Palliative Care Provider PMT # [phone]  If patient remains symptomatic despite maximum doses, please call PMT at [phone] between 0700 and 1900. Outside of these hours, please call attending, as PMT does not have night coverage.  Personally spent 125 minutes in patient care including extensive chart review (labs, imaging, progress/consult notes, vital signs), medically appropraite exam, discussed with treatment team, education to patient, family, and staff, documenting clinical information, medication review and management, coordination of care, and available advanced directive documents.

## 2023-07-21 NOTE — Plan of Care (Signed)

## 2023-07-21 NOTE — Progress Notes (Signed)
OT Cancellation Note  Patient Details Name: Craig Snow. MRN: [id number] DOB: 13-Sep-1920   Cancelled Treatment:    Reason Eval/Treat Not Completed: Other (comment). Pt and family meeting with palliative care at this time. OT will continue to follow and re-attempt if appropriate.   Emmanuell Kantz L. Jerryl Holzhauer, OTR/L  07/21/23, 1:19 PM

## 2023-07-21 NOTE — Progress Notes (Signed)
Pharmacy Antibiotic Note  Craig Snow. is a 89 y.o. male admitted on 07/19/2023 with  recurrent aspiration PNA .  Pharmacy has been consulted for ampicillin/sulbactam dosing.  Today, 07/21/23 WBC elevated. Noted pt on solu-medrol 40 mg IV daily SCr WNL and stable  Today is day #3 of IV antibiotics.   Plan: Increase ampicillin/sulbactam dose to 3 g IV q6h  Renal function stable, pharmacy to sign off. Please re-consult if needed.   Height: 5\' 11"  (180.3 cm) Weight: 60.7 kg (133 lb 13.1 oz) IBW/kg (Calculated) : 75.3  Temp (24hrs), Avg:98.1 F (36.7 C), Min:97.5 F (36.4 C), Max:98.9 F (37.2 C)  Recent Labs  Lab 07/19/23 1806 07/19/23 1812 07/19/23 2002 07/20/23 0502  WBC 21.0*  --   --  25.1*  CREATININE 0.77 0.90  --  0.67  LATICACIDVEN  --   --  3.1* 2.4*    Estimated Creatinine Clearance: 42.2 mL/min (by C-G formula based on SCr of 0.67 mg/dL).    Allergies  Allergen Reactions   Aspirin Other (See Comments)    Unknown reaction - listed on [facility] 07/30/21   Ibuprofen Anaphylaxis   Clindamycin/Lincomycin Other (See Comments)    Unknown reaction - listed on [facility] 07/30/21   Fluconazole Other (See Comments)    Hallucinations   Other Other (See Comments)    Unknown reaction to opioids - listed on [facility] 07/30/21 hallucinations   Sulfa Antibiotics Other (See Comments)    Unknown reaction - listed on [facility] 07/30/21   Tramadol Other (See Comments)    Hallucinations/ Currently taking for severe pain    Cindi Carbon, PharmD 07/21/2023 10:03 AM

## 2023-07-21 NOTE — Evaluation (Addendum)
SLP Cancellation Note  Patient Details Name: Craig Snow. MRN: [id number] DOB: June 08, 1921   Cancelled treatment:       Reason Eval/Treat Not Completed: Other (comment) (SLP will dc order at this time; pt has known aspiration, will sign off, please reorder if desired)    SLP reviewed pt's prior MBS with Dr Patterson Hammersmith, palliative including reviewing source of his dysphagia being iatrogenic side effects of XRT/surgery - causing tissue fibrosis and therefore very poor muscular contraction. Pt aspirated secretions mixed with the barium during prior MBS approximately 2 years ago. Swallow function is expected to worsen with advanced age with sarcopenia and fibrosis worsens over time.     Rolena Infante, MS [facility] SLP Acute Rehab Services Office [phone]  Chales Abrahams 07/21/2023, 8:25 AM

## 2023-07-21 NOTE — TOC Initial Note (Signed)
Transition of Care (TOC) - Initial/Assessment Note    Patient Details  Name: Craig Snow. MRN: [id number] Date of Birth: 1921/07/02  Transition of Care [facility]) CM/SW Contact:    Otelia Santee, LCSW Phone Number: 07/21/2023, 3:52 PM  Clinical Narrative:                 Pt from home with children. Pt receives PCS and has 24/7 supervision.  PT evaluated pt and recommending HH. Palliative care meeting with family. TOC to follow for disposition.   Expected Discharge Plan: Home w Home Health Services Barriers to Discharge: Continued Medical Work up   Patient Goals and CMS Choice Patient states their goals for this hospitalization and ongoing recovery are:: To be determined          Expected Discharge Plan and Services In-house Referral: Clinical Social Work Discharge Planning Services: NA Post Acute Care Choice: Home Health Living arrangements for the past 2 months: Single Family Home                                      Prior Living Arrangements/Services Living arrangements for the past 2 months: Single Family Home Lives with:: Adult Children Patient language and need for interpreter reviewed:: Yes Do you feel safe going back to the place where you live?: Yes      Need for Family Participation in Patient Care: Yes (Comment) Care giver support system in place?: Yes (comment) Current home services: DME, Other (comment) (PCS; RW) Criminal Activity/Legal Involvement Pertinent to Current Situation/Hospitalization: No - Comment as needed  Activities of Daily Living      Permission Sought/Granted   Permission granted to share information with : No              Emotional Assessment   Attitude/Demeanor/Rapport: Unable to Assess Affect (typically observed): Unable to Assess Orientation: : Oriented to Self Alcohol / Substance Use: Not Applicable Psych Involvement: No (comment)  Admission diagnosis:  Lactic acid acidosis [E87.20] Multifocal pneumonia  [J18.9] Leukocytosis, unspecified type [D72.829] Patient Active Problem List   Diagnosis Date Noted   Aspiration pneumonia (HCC) 07/20/2023   Multifocal pneumonia 07/19/2023   General weakness 04/24/2023   Pressure-type pain 07/08/2022   Surgical wound dehiscence 04/13/2022   Pain due to onychomycosis of toenails of both feet 12/22/2021   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HCC) 12/22/2021   Femoral neck fracture (HCC) 10/22/2021   HLD (hyperlipidemia) 10/22/2021   HTN (hypertension) 10/22/2021   Hypothyroidism 10/22/2021   UTI (urinary tract infection) 07/30/2021   Stage 1 skin ulcer of sacral region (HCC) 07/30/2021   History of bladder cancer 07/30/2021   Atrial fibrillation (HCC) 07/29/2021   PCP:  Daisy Floro, MD Pharmacy:   [facility] [place], [place] - [address] [facility] [address] [place] [address] Phone: [phone] Fax: [phone]     Social Drivers of Health (SDOH) Social History: SDOH Screenings   Food Insecurity: No Food Insecurity (07/21/2023)  Housing: Low Risk  (07/21/2023)  Transportation Needs: No Transportation Needs (07/21/2023)  Utilities: Not At Risk (07/21/2023)  Social Connections: Socially Isolated (07/21/2023)  Tobacco Use: Medium Risk (07/19/2023)   SDOH Interventions:     Readmission Risk Interventions    07/21/2023    3:50 PM 04/15/2022    1:03 PM  Readmission Risk Prevention Plan  Transportation Screening Complete Complete  PCP or  Specialist Appt within 5-7 Days  Complete  PCP or Specialist Appt within 3-5 Days Complete   Home Care Screening  Complete  Medication Review (RN CM)  Complete  HRI or Home Care Consult Complete   Social Work Consult for Recovery Care Planning/Counseling Complete   Palliative Care Screening Complete   Medication Review Oceanographer) Complete

## 2023-07-21 NOTE — Progress Notes (Signed)
Progress Note   Patient: Craig Snow. ZOX:[id number] DOB: [birth year] DOA: 07/19/2023     2 DOS: the patient was seen and examined on 07/21/2023   Brief hospital course: 89yo with h/o afib not on AC, HLD, bladder CA, BPH, and HTN who presented on 1/15 with cough and SOB.  He has known aspiration, is planning to undergo PEG tube placement on 1/22.  CXR with multifocal PNA, likely associated with continued aspiration.   Assessment and Plan:  Multifocal pneumonia, likely related to aspiration pneumonia Patient with known h/o chronic aspiration Scheduled for PEG tube placement (see below), but had worsening cough and SOB, really unable to take PO Significant leukocytosis present with lactic acid elevation to 3.1 Chest x-ray showed multifocal infiltrates Received Rocephin and Zithromax -> Unasyn given concern for aspiration pneumonia Follow blood cultures Speech swallow evaluation ordered but canceled - his aspiration issue is well known Supplemental oxygen as needed Continue incentive spirometry Was treated for pneumonia in the past but has not completely resolved likely secondary to ongoing aspiration   Dysphagia MBS in 05/2022 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 Likely with worsening known chronic aspiration Discussion by admitting MD and myself with the son discouraging feeding tube placement His son reports that the patient is not ready to give up and wants the feeding tube despite the fact that it is unlikely to help and likely to cause more harm This plan was already in place and IR consult requested Given current leukocytosis, IR has deferred Palliative care consulted for Ethics consult While we continue to treat the aspiration PNA in order to make him as medically stable as possible for possible procedure, we will also continue to discuss GOC with the family and discourage PEG tube placement - as he is likely to continue to have poor outcomes and  possibly worse outcomes with tube feeds   Elevated BNP Echo ordered by admitting physician Preserved EF, mild pulmonary HTN   Atrial fibrillation Rate controlled with metoprolol Not on AC   Failure to thrive, poor oral intake, elevated BUN  Continue mirtazepine  Will hold Lasix, patient was not taking at home either Nutrition consulted PT/OT consults Goals of care need to be reconsidered - palliative care/ethics consulted   Hypothyroidism   Continue Synthroid and Cytomel Normal TSH   Hyperlipidemia On Lipitor at home but not taking it Will stop   History of bladder cancer and BPH Has had chronic indwelling Foley catheter for urinary retention for 2 years  [facility] No active issues at this time   Essential hypertension Continue metoprolol   Anxiety, agitation Continue buspirone, Haldol and mirtazepine (Haldol changed to prn agitation)   Goals of care Admitting physician discussed goals of care with the patient's son at bedside and I also discussed with him by telephone this AM   Patient's son states that patient had wishes for feeding tube placement Patient has had follow-up with palliative care and hospice as outpatient and family did not find this helpful Palliative care consulted for ethics assistance Lives at home with family and full-time caregivers, anticipated to return to that environment   DNR Confirmed on admission       Consultants: IR Palliative care Nutrition PT OT   Procedures: None   Antibiotics: Azithromycin x 1 Ceftriaxone x 1 Unasyn 1/15-  30 Day Unplanned Readmission Risk Score    Flowsheet Row ED to Hosp-Admission (Current) from 07/19/2023 in [facility] 5  E[facility] MEDICAL UNIT  30 Day Unplanned Readmission Risk Score (%) 25.34 Filed at 07/21/2023 0801       This score is the patient's risk of an unplanned readmission within 30 days of being discharged (0 -100%). The score is based on dignosis,  age, lab data, medications, orders, and past utilization.   Low:  0-14.9   Medium: 15-21.9   High: 22-29.9   Extreme: 30 and above           Subjective: Pleasant, looks much better today.  Happy to have water and clearly wants more PO intake.   Objective: Vitals:   07/20/23 2349 07/21/23 0417  BP: (!) 108/51 (!) 137/98  Pulse: 81 92  Resp: 19 17  Temp: 98.3 F (36.8 C) (!) 97.5 F (36.4 C)  SpO2: 95% 94%    Intake/Output Summary (Last 24 hours) at 07/21/2023 1610 Last data filed at 07/21/2023 0601 Gross per 24 hour  Intake 750 ml  Output --  Net 750 ml   Filed Weights   07/19/23 1534 07/21/23 0500  Weight: 64 kg 60.7 kg    Exam:  General:  Appears frail, cachectic, more alert today, continuously asking for water Eyes:   normal lids, mostly closed ENT:  hard of hearing, grossly normal lips & tongue; edentulous Neck:  no LAD, masses or thyromegaly Cardiovascular:  RRR No LE edema.  Respiratory:   Diffuse coarse rhonchi with improved air movement.  Normal respiratory effort. Abdomen:  soft, NT, ND, scaphoid Skin:  no rash or induration seen on limited exam Musculoskeletal:  no bony abnormality Psychiatric:  blunted mood and affect, speech minimal Neurologic:  no obvious deficits on brief exam  Data Reviewed: I have reviewed the patient's lab results since admission.  Pertinent labs for today include:   Na++ 146 CO2 19 Glucose 111 BUN 28 WBC 20.3 INR 1.4    Family Communication: Son was present throughout evaluation  Disposition: Status is: Inpatient Remains inpatient appropriate because: ongoing evaluation and treatment     Time spent: 50 minutes  Unresulted Labs (From admission, onward)     Start     Ordered   07/21/23 0832  Basic metabolic panel  Once,   R        07/21/23 0831   07/21/23 0803  CBC with Differential/Platelet  Once,   R        07/21/23 0802   07/19/23 2043  Legionella Pneumophila Serogp 1 Ur Ag  Once,   R        07/19/23  2042             Author: Jonah Blue, MD 07/21/2023 8:32 AM  For on call review [email or web address].

## 2023-07-21 NOTE — Progress Notes (Signed)
Initial Nutrition Assessment  INTERVENTION:   -Will monitor for GOC   -If feeding tube placement occurs, TF protocol can be started and RD will follow-up   NUTRITION DIAGNOSIS:   Inadequate oral intake related to acute illness as evidenced by meal completion < 25%.  GOAL:   Other (Comment)  MONITOR:   PO intake  REASON FOR ASSESSMENT:   Consult Assessment of nutrition requirement/status  ASSESSMENT:   89yo with h/o afib not on AC, HLD, bladder CA, BPH, and HTN who presented on 1/15 with cough and SOB.  He has known aspiration, was scheduled to undergo PEG tube placement on 1/22.  CXR with multifocal PNA, likely associated with continued aspiration.  Patient currently undergoing GOC discussions if feeding tube should be placed or not.  MDs have not recommended a feeding tube given this will not prevent aspiration and could likely cause harm. Per SLP, pt with known aspiration.  Pt has not been eating and drinking well for  1-2 weeks PTA. Developed cough r/t aspiration.  Now on dysphagia 3 diet.  Recommend starting TF protocol over the weekend if tube placed.  Per weight records, pt has lost 7 lbs since 06/20/23 (5% wt loss x 1 month, significant for time frame).  Medications: Remeron  Labs reviewed: Elevated Na   NUTRITION - FOCUSED PHYSICAL EXAM:  Deferred at this time.  Diet Order:   Diet Order             DIET DYS 3 Room service appropriate? Yes with Assist; Fluid consistency: Thin  Diet effective now                   EDUCATION NEEDS:   No education needs have been identified at this time  Skin:  Skin Assessment: Reviewed RN Assessment  Last BM:  PTA  Height:   Ht Readings from Last 1 Encounters:  07/19/23 5\' 11"  (1.803 m)    Weight:   Wt Readings from Last 1 Encounters:  07/21/23 60.7 kg    BMI:  Body mass index is 18.66 kg/m.  Estimated Nutritional Needs:   Kcal:  1400-1600  Protein:  70-80g  Fluid:  1.6L/day   Tilda Franco, MS, RD, LDN Inpatient Clinical Dietitian Contact via Secure chat

## 2023-07-22 DIAGNOSIS — J690 Pneumonitis due to inhalation of food and vomit: Secondary | ICD-10-CM | POA: Diagnosis not present

## 2023-07-22 DIAGNOSIS — Z515 Encounter for palliative care: Secondary | ICD-10-CM | POA: Diagnosis not present

## 2023-07-22 DIAGNOSIS — J189 Pneumonia, unspecified organism: Secondary | ICD-10-CM | POA: Diagnosis not present

## 2023-07-22 DIAGNOSIS — R4589 Other symptoms and signs involving emotional state: Secondary | ICD-10-CM | POA: Diagnosis not present

## 2023-07-22 LAB — CBC WITH DIFFERENTIAL/PLATELET
Abs Immature Granulocytes: 0.31 10*3/uL — ABNORMAL HIGH (ref 0.00–0.07)
Basophils Absolute: 0.1 10*3/uL (ref 0.0–0.1)
Basophils Relative: 0 %
Eosinophils Absolute: 0 10*3/uL (ref 0.0–0.5)
Eosinophils Relative: 0 %
HCT: 40.8 % (ref 39.0–52.0)
Hemoglobin: 12.7 g/dL — ABNORMAL LOW (ref 13.0–17.0)
Immature Granulocytes: 1 %
Lymphocytes Relative: 2 %
Lymphs Abs: 0.6 10*3/uL — ABNORMAL LOW (ref 0.7–4.0)
MCH: 34.6 pg — ABNORMAL HIGH (ref 26.0–34.0)
MCHC: 31.1 g/dL (ref 30.0–36.0)
MCV: 111.2 fL — ABNORMAL HIGH (ref 80.0–100.0)
Monocytes Absolute: 0.9 10*3/uL (ref 0.1–1.0)
Monocytes Relative: 3 %
Neutro Abs: 24.9 10*3/uL — ABNORMAL HIGH (ref 1.7–7.7)
Neutrophils Relative %: 94 %
Platelets: 250 10*3/uL (ref 150–400)
RBC: 3.67 MIL/uL — ABNORMAL LOW (ref 4.22–5.81)
RDW: 13.9 % (ref 11.5–15.5)
WBC: 26.8 10*3/uL — ABNORMAL HIGH (ref 4.0–10.5)
nRBC: 0.1 % (ref 0.0–0.2)

## 2023-07-22 LAB — BASIC METABOLIC PANEL
Anion gap: 17 — ABNORMAL HIGH (ref 5–15)
BUN: 41 mg/dL — ABNORMAL HIGH (ref 8–23)
CO2: 19 mmol/L — ABNORMAL LOW (ref 22–32)
Calcium: 10.3 mg/dL (ref 8.9–10.3)
Chloride: 112 mmol/L — ABNORMAL HIGH (ref 98–111)
Creatinine, Ser: 1.13 mg/dL (ref 0.61–1.24)
GFR, Estimated: 58 mL/min — ABNORMAL LOW (ref >60)
Glucose, Bld: 167 mg/dL — ABNORMAL HIGH (ref 70–99)
Potassium: 3.4 mmol/L — ABNORMAL LOW (ref 3.5–5.1)
Sodium: 148 mmol/L — ABNORMAL HIGH (ref 135–145)

## 2023-07-22 MED ORDER — POTASSIUM CHLORIDE IN NACL 20-0.9 MEQ/L-% IV SOLN
INTRAVENOUS | Status: AC
Start: 2023-07-22 — End: 2023-07-23
  Filled 2023-07-22 (×2): qty 1000

## 2023-07-22 MED ORDER — SODIUM CHLORIDE 0.9 % IV SOLN: INTRAVENOUS | Status: DC

## 2023-07-22 MED ORDER — HALOPERIDOL LACTATE 2 MG/ML PO CONC: 1.0000 mg | ORAL | Status: DC | PRN

## 2023-07-22 NOTE — Plan of Care (Signed)
  Problem: Education: Goal: Knowledge of General Education information will improve Description Including pain rating scale, medication(s)/side effects and non-pharmacologic comfort measures Outcome: Progressing   

## 2023-07-22 NOTE — Progress Notes (Addendum)
Progress Note   Patient: Craig Snow. JYN:[id number] DOB: 1920/09/17 DOA: 07/19/2023     3 DOS: the patient was seen and examined on 07/22/2023   Brief hospital course: 89yo with h/o afib not on AC, HLD, bladder CA, BPH, and HTN who presented on 1/15 with cough and SOB.  He has known aspiration, is planning to undergo PEG tube placement on 1/22.  CXR with multifocal PNA, likely associated with continued aspiration.   Assessment and Plan:  Multifocal pneumonia, likely related to aspiration pneumonia Patient with known h/o chronic aspiration Scheduled for PEG tube placement (see below), but had worsening cough and SOB, really unable to take PO Significant leukocytosis present with lactic acid elevation to 3.1 Chest x-ray showed multifocal infiltrates Received Rocephin and Zithromax -> Unasyn given concern for aspiration pneumonia Blood cultures NTD x 2 days Speech swallow evaluation ordered but canceled - his aspiration issue is well known Supplemental oxygen as needed Continue incentive spirometry Was treated for pneumonia in the past but has not completely resolved likely secondary to ongoing aspiration   Dysphagia MBS in 05/2022 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 Likely with worsening known chronic aspiration Discussion by admitting MD and myself with the son discouraging feeding tube placement His son reported that the patient is not ready to give up and wants the feeding tube despite the fact that it is unlikely to help and likely to cause more harm This plan was already in place and IR consult requested Given current leukocytosis, IR deferred Palliative care consulted for Ethics consult He is likely to continue to have poor outcomes and possibly worse outcomes with tube feeds As a result and after significant discussions, family is in agreement with plan to NOT place PEG tube   Atrial fibrillation Rate controlled with metoprolol Not on [facility]    Goals of care He has had poor PO intake at home and struggles with PO intake, as above Continue mirtazepine  Goals of care reconsidered with support from palliative care/ethics  Family is now open to hospice, possibly to residential hospice following hospitalization  Nutrition issue Nutrition Problem: Inadequate oral intake Etiology: acute illness Signs/Symptoms: meal completion < 25% Interventions: Refer to RD note for recommendations    Hypothyroidism   Continue Synthroid and Cytomel Normal TSH   History of bladder cancer and BPH Has had chronic indwelling Foley catheter for urinary retention for 2 years  [facility] No active issues at this time   Essential hypertension Continue metoprolol   Anxiety, agitation Continue buspirone, Haldol and mirtazepine (Haldol changed to prn agitation)   DNR Confirmed on admission       Consultants: IR Palliative care hospice Nutrition PT OT   Procedures: None   Antibiotics: Azithromycin x 1 Ceftriaxone x 1 Unasyn 1/15-   30 Day Unplanned Readmission Risk Score    Flowsheet Row ED to Hosp-Admission (Current) from [facility]19/2023 [facility]  30 Day Unplanned Readmission Risk Score (%) 23.6 Filed at 07/22/2023 0400       This score is the patient's risk of an unplanned readmission within 30 days of being discharged (0 -100%). The score is based on dignosis, age, lab data, medications, orders, and past utilization.   Low:  0-14.9   Medium: 15-21.9   High: 22-29.9   Extreme: 30 and above           Subjective: Alert, awake, wants to go home.  Objective: Vitals:   07/21/23 2022 07/22/23 0535  BP: (!) 153/66 (!) 146/83  Pulse: 89 91  Resp: 16   Temp: 97.7 F (36.5 C) 97.9 F (36.6 C)  SpO2: 100% 97%    Intake/Output Summary (Last 24 hours) at 07/22/2023 1230 Last data filed at 07/22/2023 1100 Gross per 24 hour  Intake 100 ml  Output 550 ml  Net  -450 ml   Filed Weights   07/19/23 1534 07/21/23 0500 07/22/23 0500  Weight: 64 kg 60.7 kg 61.4 kg    Exam:  General:  Appears frail, cachectic, more alert today, continuously asking when he can go home Eyes:   normal lids, sclera ENT:  hard of hearing, grossly normal lips & tongue; artificial dentition Neck:  no LAD, masses or thyromegaly Cardiovascular:  RRR No LE edema.  Respiratory:   Scattered rhonchi with improved air movement.  Normal respiratory effort. Abdomen:  soft, NT, ND, scaphoid Skin:  no rash or induration seen on limited exam Musculoskeletal:  no bony abnormality Psychiatric:  blunted mood and affect, speech minimal Neurologic:  no obvious deficits on brief exam  Data Reviewed: I have reviewed the patient's lab results since admission.  Pertinent labs for today include:   Na++ 148 K+ 3.4 CO2 19 Glucose 167 BUN 41/Creatinine 1.13/GFR 58 Anion gap 17 WBC 26.8 Hgb 12.7     Family Communication: Son was present throughout evaluation  Disposition: Status is: Inpatient Remains inpatient appropriate because: ongoing management     Time spent: 50 minutes  Unresulted Labs (From admission, onward)     Start     Ordered   07/23/23 0500  CBC with Differential/Platelet  Tomorrow morning,   R        07/22/23 0802   07/23/23 0500  Basic metabolic panel  Tomorrow morning,   R        07/22/23 0802   07/19/23 2043  Legionella Pneumophila Serogp 1 Ur Ag  Once,   R        07/19/23 2042             Author: Jonah Blue, MD 07/22/2023 12:30 PM  For on call review [email or web address].

## 2023-07-22 NOTE — Progress Notes (Signed)
OT Cancellation Note  Patient Details Name: Craig Snow. MRN: [id number] DOB: 1921/03/11   Cancelled Treatment:    Reason Eval/Treat Not Completed: Patient declined, no reason specified Patient declining therapy per nurse. OT to continue to follow and check for OT needs. Appears like plan is for hospice at time of d/c. Unclear if inpatient or home at this time.  Rosalio Loud, MS Acute Rehabilitation Department Office# [phone] 07/22/2023, 2:57 PM

## 2023-07-22 NOTE — Progress Notes (Signed)
Daily Progress Note   Patient Name: Craig Snow.       Date: 07/22/2023 DOB: Sep 20, 1920  Age: 89 y.o. MRN#: [id number] Attending Physician: Jonah Blue, MD Primary Care Physician: Daisy Floro, MD Admit Date: 07/19/2023 Length of Stay: 3 days  Reason for Consultation/Follow-up: Establishing goals of care  Subjective:   CC: Patient laying in bed lethargic.  Spoke with patient's son, Brett Canales, at bedside.  Following up regarding complex medical decision making.  Subjective:  Reviewed EMR prior to presenting to bedside.  Patient's leukocytosis has continued to increase to 26.8 in setting of continued aspiration.  Patient received IV Haldol 2 mg x 1 dose overnight for agitation.  When presenting to bedside, patient laying in bed.  Patient will awaken though very lethargic and incredibly hard of hearing.  Patient's son, Brett Canales, present at bedside.  Able to discuss care plan for patient today.  Brett Canales notes that he already discussed with hospitalist, Dr. Ophelia Charter, about pursuing hospice.  Spent time discussing hospice philosophy in general.  Brett Canales hoping to get patient home with hospice though knows inpatient hospice may be required.  With this in mind, son requesting referral to [facility] hospice for possibility of [facility] if needed.  Son plans to continue current antibiotic regimen for current course though acknowledges hearing that patient will continue to aspirate leading to infection.  Again reviewed could consider medications for comfort.  Son does feel that Ativan would assist with patient's agitation management.  Will say that when patient occasionally awakens, looks scared and confused.  Will ask RN to provide dose of Haldol at this time which son agrees with.  All questions answered at that time.  Provided emotional support reactive listening.  Noted palliative medicine team will continue to follow with patient's medical journey.  Updated IDT including hospitalist, RN, TOC, and ACC  liaison regarding discussion.  Objective:   Vital Signs:  BP (!) 146/83 (BP Location: Right Arm)   Pulse 91   Temp 97.9 F (36.6 C) (Oral)   Resp 16   Ht 5\' 11"  (1.803 m)   Wt 61.4 kg   SpO2 97%   BMI 18.88 kg/m   Physical Exam: General: NAD, lethargic, chronically ill-appearing, frail, cachectic HENT: Dry mucous membranes Cardiovascular: RRR Respiratory: no increased work of breathing noted, not in respiratory distress Neuro: Lethargic, incredibly hard of hearing  Imaging: I personally reviewed recent imaging.   Assessment & Plan:   Assessment: Patient is a 89 year old male with a past medical history of A-fib not on anticoagulation, hyperlipidemia, bladder cancer, BPH, and hypertension who was admitted on 07/19/2023 for management of cough and shortness of breath. During hospitalization he was found to have multifocal pneumonia related to aspiration. Patient had outpatient appointment planned for PEG tube placement. Palliative medicine team consulted to assist with complex medical decision making.   Recommendations/Plan: # Complex medical decision making/goals of care:    -Patient unable to participate in complex medical decision making secondary to medical status.                 -Discussed care with patient's son as detailed above in HPI.  At this time continuing antibiotics for completion of appropriate management of pneumonia as per family's request.  Son acknowledges that patient will continue to aspirate leading to further pneumonia so even antibiotics will not "fix" this.  Son hoping to coordinate getting patient home with hospice.                -  Able to speak to patient's reported HCPOA, Makena [place], on 07/21/23 who agreed to deferring medical decisions making to Brett Canales since Brett Canales is here with patient and has been taking care of him for over a year.                -  Code Status: Limited: Do not attempt resuscitation (DNR) -DNR-LIMITED -Do Not Intubate/DNI     #  Symptom management Patient is receiving these palliative interventions for symptom management with an intent to improve quality of life.                 -Secretions                               -Continue scopolamine patch.  Discussed with son who agreed with addition of this medication.   # Psycho-social/Spiritual Support:  - Support System: sons, DIL  # Discharge Planning: Home with Hospice  -Son specifically request hospice referral to [facility]   Discussed with: Hospitalist, RN, ACC liaison, TOC, patient's son  Thank you for allowing the palliative care team to participate in the care Arvilla Meres.Alvester Morin, DO Palliative Care Provider PMT # [phone]  If patient remains symptomatic despite maximum doses, please call PMT at [phone] between 0700 and 1900. Outside of these hours, please call attending, as PMT does not have night coverage.  Personally spent 35 minutes in patient care including extensive chart review (labs, imaging, progress/consult notes, vital signs), medically appropraite exam, discussed with treatment team, education to patient, family, and staff, documenting clinical information, medication review and management, coordination of care, and available advanced directive documents.

## 2023-07-22 NOTE — Plan of Care (Signed)

## 2023-07-23 DIAGNOSIS — J189 Pneumonia, unspecified organism: Secondary | ICD-10-CM | POA: Diagnosis not present

## 2023-07-23 DIAGNOSIS — Z515 Encounter for palliative care: Secondary | ICD-10-CM | POA: Diagnosis not present

## 2023-07-23 DIAGNOSIS — R4589 Other symptoms and signs involving emotional state: Secondary | ICD-10-CM | POA: Diagnosis not present

## 2023-07-23 DIAGNOSIS — J690 Pneumonitis due to inhalation of food and vomit: Secondary | ICD-10-CM | POA: Diagnosis not present

## 2023-07-23 DIAGNOSIS — R451 Restlessness and agitation: Secondary | ICD-10-CM

## 2023-07-23 LAB — BASIC METABOLIC PANEL
Anion gap: 7 (ref 5–15)
BUN: 48 mg/dL — ABNORMAL HIGH (ref 8–23)
CO2: 23 mmol/L (ref 22–32)
Calcium: 9.8 mg/dL (ref 8.9–10.3)
Chloride: 115 mmol/L — ABNORMAL HIGH (ref 98–111)
Creatinine, Ser: 1.06 mg/dL (ref 0.61–1.24)
GFR, Estimated: >60 mL/min (ref >60)
Glucose, Bld: 183 mg/dL — ABNORMAL HIGH (ref 70–99)
Potassium: 3.3 mmol/L — ABNORMAL LOW (ref 3.5–5.1)
Sodium: 145 mmol/L (ref 135–145)

## 2023-07-23 LAB — CBC WITH DIFFERENTIAL/PLATELET
Abs Immature Granulocytes: 0.21 10*3/uL — ABNORMAL HIGH (ref 0.00–0.07)
Basophils Absolute: 0 10*3/uL (ref 0.0–0.1)
Basophils Relative: 0 %
Eosinophils Absolute: 0 10*3/uL (ref 0.0–0.5)
Eosinophils Relative: 0 %
HCT: 38.1 % — ABNORMAL LOW (ref 39.0–52.0)
Hemoglobin: 12 g/dL — ABNORMAL LOW (ref 13.0–17.0)
Immature Granulocytes: 1 %
Lymphocytes Relative: 4 %
Lymphs Abs: 0.6 10*3/uL — ABNORMAL LOW (ref 0.7–4.0)
MCH: 34.7 pg — ABNORMAL HIGH (ref 26.0–34.0)
MCHC: 31.5 g/dL (ref 30.0–36.0)
MCV: 110.1 fL — ABNORMAL HIGH (ref 80.0–100.0)
Monocytes Absolute: 0.4 10*3/uL (ref 0.1–1.0)
Monocytes Relative: 2 %
Neutro Abs: 16.5 10*3/uL — ABNORMAL HIGH (ref 1.7–7.7)
Neutrophils Relative %: 93 %
Platelets: 192 10*3/uL (ref 150–400)
RBC: 3.46 MIL/uL — ABNORMAL LOW (ref 4.22–5.81)
RDW: 13.9 % (ref 11.5–15.5)
WBC: 17.8 10*3/uL — ABNORMAL HIGH (ref 4.0–10.5)
nRBC: 0.1 % (ref 0.0–0.2)

## 2023-07-23 LAB — LEGIONELLA PNEUMOPHILA SEROGP 1 UR AG: L. pneumophila Serogp 1 Ur Ag: NEGATIVE

## 2023-07-23 MED ORDER — HALOPERIDOL LACTATE 2 MG/ML PO CONC: 1.0000 mg | ORAL | Status: AC | PRN

## 2023-07-23 MED ORDER — HALOPERIDOL LACTATE 5 MG/ML IJ SOLN: 2.0000 mg | Freq: Four times a day (QID) | INTRAMUSCULAR | Status: AC | PRN

## 2023-07-23 MED ORDER — SCOPOLAMINE 1 MG/3DAYS TD PT72: 1.0000 | MEDICATED_PATCH | TRANSDERMAL | Status: AC

## 2023-07-23 NOTE — Evaluation (Signed)
Occupational Therapy Evaluation Patient Details Name: Craig Snow. MRN: [id number] DOB: [birth year] Today's Date: 07/23/2023   History of Present Illness Craig Snow. is an 89 y.o. male who presented to hospital with cough congestion and shortness of breath with decreased appetite and increasing generalized weakness. Pt admitted with multifocal pneumonia. PMH: afib, GERD, hyperlipidemia, history of bladder cancer and BPH, HTN   Clinical Impression   Patient evaluated by Occupational Therapy with no further acute OT needs identified. All education has been completed and the patient has no further questions. Patient is TD for care at this time. Patient noted to be planning to transition to hospice at time of d/c.  See below for any follow-up Occupational Therapy or equipment needs. OT is signing off. Thank you for this referral.        If plan is discharge home, recommend the following: Two people to help with bathing/dressing/bathroom;Two people to help with walking and/or transfers    Functional Status Assessment  Patient has had a recent decline in their functional status and/or demonstrates limited ability to make significant improvements in function in a reasonable and predictable amount of time  Equipment Recommendations  Hospital bed;Hoyer lift       Precautions / Restrictions Precautions Precautions: Fall Precaution Comments: monitor O2 and HR Restrictions Weight Bearing Restrictions Per Provider Order: No      Mobility Bed Mobility Overal bed mobility: Needs Assistance Bed Mobility: Rolling Rolling: Total assist                      ADL either performed or assessed with clinical judgement   ADL Overall ADL's : Needs assistance/impaired       General ADL Comments: patient is currently TD for ADLs with TD for positionin in bed. patient having increased difficulty with communications during session. nurse called into room for IV redness and edmea, and  patients continued requests for oral intake with unclear swallowing issues at this time. nurse in room at end of session.      Pertinent Vitals/Pain Pain Assessment Pain Assessment: Faces Faces Pain Scale: Hurts a little bit Pain Location: generalized with movement Pain Descriptors / Indicators: Grimacing Pain Intervention(s): Limited activity within patient's tolerance, Monitored during session, Repositioned     Extremity/Trunk Assessment Upper Extremity Assessment Upper Extremity Assessment: Difficult to assess due to impaired cognition;LUE deficits/detail LUE Deficits / Details: noted to have redness and edema near the IV site with nurse called into room to address. patient reported "sore" and tried to pull IV lines.   Lower Extremity Assessment Lower Extremity Assessment: Defer to PT evaluation   Cervical / Trunk Assessment Cervical / Trunk Assessment: Kyphotic   Communication Communication Communication: Difficulty communicating thoughts/reduced clarity of speech;Difficulty following commands/understanding;Hearing impairment Following commands: Follows one step commands inconsistently Cueing Techniques: Verbal cues;Gestural cues;Tactile cues;Visual cues   Cognition Arousal: Alert Behavior During Therapy: Flat affect Overall Cognitive Status: Impaired/Different from baseline       General Comments: Patient asking for "water" "cold" and Juice multiple times duirng session. nurse called into room for recomendations.                Home Living Family/patient expects to be discharged to:: Private residence Living Arrangements: Children Available Help at Discharge: Family;Personal care attendant Type of Home: House Home Access: Level entry     Home Layout: Two level Alternate Level Stairs-Number of Steps: flight with stair lift  Home Equipment: Agricultural consultant (2 wheels)   Additional Comments: Pt lives in converted basement, level entry sidewalk  entry, stairlifts to go to higher level for showers and rest of home. Son reports PCA or son is with pt 24/7 per PT eval. no family in room during session,.      Prior Functioning/Environment Prior Level of Function : Needs assist             Mobility Comments: Pt using RW in the home with A from family and PCA, supv 24/7 ADLs Comments: PCA and family assisting as needed        OT Problem List: Decreased strength;Pain;Decreased coordination;Decreased cognition;Decreased range of motion;Decreased activity tolerance;Decreased safety awareness;Decreased knowledge of use of DME or AE;Impaired balance (sitting and/or standing);Decreased knowledge of precautions;Impaired UE functional use      OT Treatment/Interventions:      OT Goals(Current goals can be found in the care plan section) Acute Rehab OT Goals OT Goal Formulation: All assessment and education complete, DC therapy  OT Frequency:         AM-PAC OT "6 Clicks" Daily Activity     Outcome Measure Help from another person eating meals?: Total Help from another person taking care of personal grooming?: Total Help from another person toileting, which includes using toliet, bedpan, or urinal?: Total Help from another person bathing (including washing, rinsing, drying)?: Total Help from another person to put on and taking off regular upper body clothing?: Total Help from another person to put on and taking off regular lower body clothing?: Total 6 Click Score: 6   End of Session Nurse Communication: Other (comment) (concerns about IV insertion site, patients continued requests for water)  Activity Tolerance:   Patient left: in bed;with call bell/phone within reach;with bed alarm set;Other (comment) (nurse in room)  OT Visit Diagnosis: Unsteadiness on feet (R26.81);Feeding difficulties (R63.3);Other abnormalities of gait and mobility (R26.89);Repeated falls (R29.6);Muscle weakness (generalized) (M62.81)                Time:  1610-9604 OT Time Calculation (min): 12 min Charges:  OT General Charges $OT Visit: 1 Visit OT Evaluation $OT Eval Low Complexity: 1 Low  Vivek Grealish OTR/L, MS Acute Rehabilitation Department Office# [phone]   Selinda Flavin 07/23/2023, 10:04 AM

## 2023-07-23 NOTE — Plan of Care (Signed)

## 2023-07-23 NOTE — Progress Notes (Signed)
Daily Progress Note   Patient Name: Craig Snow.       Date: 07/23/2023 DOB: May 24, 1921  Age: 89 y.o. MRN#: [id number] Attending Physician: Jonah Blue, MD Primary Care Physician: Daisy Floro, MD Admit Date: 07/19/2023 Length of Stay: 4 days  Reason for Consultation/Follow-up: Establishing goals of care  Subjective:   CC: Patient laying in mouthing he wants ice and water.  Following up regarding complex medical decision making.  Subjective:  Reviewed EMR prior to presenting to bedside.  Leukocytosis noted to be down trending to 17.8 today.  EMR reviewed patient required IV Haldol 2 mg x 4 doses within the past 24 hours.  Patient has not received any oral Haldol solution.  Presented to bedside to see patient.  Patient's son not yet at bedside.  Able to use patient's whiteboard to attempt communication with him since he is laying in bed awake at this time.  Inquired what patient would want currently.  Patient asking for water and ice.  This provider was able to get patient some ice and spoon feed ice at a very slow pace to minimize chance of aspiration.  Patient nodded his head with appreciation.  Spent time providing support as able.  Thanked patient for allow me to visit with him today.  Discussed care with IDT.  [facility] hospice liaison planning to meet with family including 2 sons at 1:30 PM today to discuss hospice support.  Objective:   Vital Signs:  BP (!) 167/88 (BP Location: Right Arm)   Pulse 93   Temp 97.7 F (36.5 C)   Resp 16   Ht 5\' 11"  (1.803 m)   Wt 60.3 kg   SpO2 99%   BMI 18.54 kg/m   Physical Exam: General: NAD, awake, chronically ill-appearing, frail, cachectic HENT: Dry mucous membranes Cardiovascular: RRR Respiratory: no increased work of breathing noted, not in respiratory distress Neuro: awake, incredibly hard of hearing  Imaging: I personally reviewed recent imaging.   Assessment & Plan:   Assessment: Patient is a 89 year old male  with a past medical history of A-fib not on anticoagulation, hyperlipidemia, bladder cancer, BPH, and hypertension who was admitted on 07/19/2023 for management of cough and shortness of breath. During hospitalization he was found to have multifocal pneumonia related to aspiration. Patient had outpatient appointment planned for PEG tube placement. Palliative medicine team consulted to assist with complex medical decision making.   Recommendations/Plan: # Complex medical decision making/goals of care:    -Patient unable to participate in complex medical decision making secondary to medical status.                 -Have continued discussions with son, Craig Snow, during hospitalization.  At this time continuing antibiotics for completion of appropriate management of pneumonia as per family's request.  Son acknowledges that patient will continue to aspirate leading to further pneumonia so even antibiotics will not "fix" this.  Son hoping to coordinate getting patient home with hospice.  [facility] hospice liaison planning to meet with family at 1:30 PM today to discuss.                -Able to speak to patient's reported HCPOA, Craig Snow, on 07/21/23 who agreed to deferring medical decisions making to Craig Snow since Craig Snow is here with patient and has been taking care of him for over a year.                -  Code Status: Limited: Do not attempt resuscitation (  DNR) -DNR-LIMITED -Do Not Intubate/DNI     # Symptom management Patient is receiving these palliative interventions for symptom management with an intent to improve quality of life.                 -Secretions                               -Continue scopolamine patch.  Discussed with son who agreed with addition of this medication.      -Agitation   -Continue oral Haldol solution 1-2 mg every 4 hours as needed   -Changed IV Haldol 2 mg every 6 hours as needed to be breakthrough to oral solution Haldol  # Psycho-social/Spiritual Support:  - Support System:  sons, DIL  # Discharge Planning: Home with Hospice vs inpatient   Discussed with: Hospitalist, RN, [facility] liaison,patient  Thank you for allowing the palliative care team to participate in the care Arvilla Meres.Alvester Morin, DO Palliative Care Provider PMT # [phone]  If patient remains symptomatic despite maximum doses, please call PMT at [phone] between 0700 and 1900. Outside of these hours, please call attending, as PMT does not have night coverage.  Personally spent 36 minutes in patient care including extensive chart review (labs, imaging, progress/consult notes, vital signs), medically appropraite exam, discussed with treatment team, education to patient, family, and staff, documenting clinical information, medication review and management, coordination of care, and available advanced directive documents.

## 2023-07-23 NOTE — Discharge Summary (Signed)
Physician Discharge Summary   Patient: Craig Snow. MRN: [id number] DOB: 02-15-1921  Admit date:     07/19/2023  Discharge date: 07/23/23  Discharge Physician: Jonah Blue   PCP: Daisy Floro, MD   Recommendations at discharge:   You are being discharged to residential hospice at [facility]  Discharge Diagnoses: Principal Problem:   Multifocal pneumonia Active Problems:   Atrial fibrillation (HCC)   History of bladder cancer   HLD (hyperlipidemia)   HTN (hypertension)   Hypothyroidism   Aspiration pneumonia (HCC)   Palliative care by specialist   Leukocytosis   Need for emotional support   Goals of care, counseling/discussion   Counseling and coordination of care   Excessive oral secretions   Agitation    Hospital Course: 89yo with h/o afib not on AC, HLD, bladder CA, BPH, and HTN who presented on 1/15 with cough and SOB.  He has known aspiration, is planning to undergo PEG tube placement on 1/22.  CXR with multifocal PNA, likely associated with continued aspiration.   Assessment and Plan:  Multifocal pneumonia, likely related to aspiration pneumonia Patient with known h/o chronic aspiration Scheduled for PEG tube placement (see below), but had worsening cough and SOB, really unable to take PO Significant leukocytosis present with lactic acid elevation to 3.1 Chest x-ray showed multifocal infiltrates Received Rocephin and Zithromax -> Unasyn given concern for aspiration pneumonia Blood cultures NTD x 2 days Speech swallow evaluation ordered but canceled - his aspiration issue is well known Was treated for pneumonia in the past but has not completely resolved likely secondary to ongoing aspiration Based on ongoing discussions re: GOC, patient is being discharged to 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 Likely with worsening known chronic  aspiration Multiple discussion held discouraging feeding tube placement Given current leukocytosis, IR deferred Palliative care consulted for Ethics consult He is likely to continue to have poor outcomes and possibly worse outcomes with tube feeds After significant discussions, family is in agreement with plan to NOT place PEG tube Upon further discussion, the patient will be discharged to residential hospice   Goals of care He has had poor PO intake at home and struggles with PO intake, as above Goals of care reconsidered with support from palliative care/ethics  Family is now open to hospice, moving to residential hospice today    DNR Confirmed on admission       Consultants: IR Palliative care hospice Nutrition PT OT   Procedures: None   Antibiotics: Azithromycin x 1 Ceftriaxone x 1 Unasyn 1/15-     Disposition: Hospice care Diet recommendation:  Regular diet despite obvious ongoing aspiration DISCHARGE MEDICATION: Allergies as of 07/23/2023       Reactions   Aspirin Other (See Comments)   Unknown reaction - listed on [facility] 07/30/21   Ibuprofen Anaphylaxis   Clindamycin/lincomycin Other (See Comments)   Unknown reaction - listed on [facility] 07/30/21   Fluconazole Other (See Comments)   Hallucinations   Other Other (See Comments)   Unknown reaction to opioids - listed on [facility] 07/30/21 hallucinations   Sulfa Antibiotics Other (See Comments)   Unknown reaction - listed on [facility] 07/30/21   Tramadol Other (See Comments)   Hallucinations/ Currently taking for severe pain        Medication List     STOP taking these medications    benzonatate 100 MG capsule Commonly known  as: TESSALON   busPIRone 5 MG tablet Commonly known as: BUSPAR   furosemide 20 MG tablet Commonly known as: LASIX   haloperidol 0.5 MG tablet Commonly known as: HALDOL   levothyroxine 50 MCG tablet Commonly known as: SYNTHROID   liothyronine 5 MCG tablet Commonly known as: CYTOMEL    Melatonin 5 MG Caps   metoprolol tartrate 25 MG tablet Commonly known as: LOPRESSOR   mirtazapine 15 MG tablet Commonly known as: REMERON   nystatin 100000 UNIT/ML suspension Commonly known as: MYCOSTATIN   potassium chloride 10 MEQ tablet Commonly known as: KLOR-CON   traMADol 50 MG tablet Commonly known as: ULTRAM       TAKE these medications    acetaminophen 650 MG CR tablet Commonly known as: TYLENOL Take 650-1,300 mg by mouth every 8 (eight) hours as needed for pain.   haloperidol 2 MG/ML solution Commonly known as: HALDOL Take 0.5-1 mLs (1-2 mg total) by mouth every 4 (four) hours as needed for agitation.   haloperidol lactate 5 MG/ML injection Commonly known as: HALDOL Inject 0.4 mLs (2 mg total) into the vein every 6 (six) hours as needed (if po unable).   scopolamine 1 MG/3DAYS Commonly known as: TRANSDERM-SCOP Place 1 patch (1.5 mg total) onto the skin every 3 (three) days. Start taking on: July 24, 2023        Discharge Exam: Ceasar Mons Weights   07/21/23 0500 07/22/23 0500 07/23/23 0500  Weight: 60.7 kg 61.4 kg 60.3 kg     Subjective: Awake, confused per son and does not recognize him.  Asking for water.   Objective: Vitals:   07/23/23 0502 07/23/23 1423  BP: (!) 167/88 (!) 155/82  Pulse: 93 79  Resp: 16 16  Temp: 97.7 F (36.5 C) 98.4 F (36.9 C)  SpO2: 99% (!) 81%    Intake/Output Summary (Last 24 hours) at 07/23/2023 1443 Last data filed at 07/23/2023 0510 Gross per 24 hour  Intake 573.41 ml  Output 675 ml  Net -101.59 ml   Filed Weights   07/21/23 0500 07/22/23 0500 07/23/23 0500  Weight: 60.7 kg 61.4 kg 60.3 kg    Exam:  General:  Appears frail, cachectic, alert, wants water Eyes:   normal lids, sclera ENT:  hard of hearing, grossly normal lips & tongue; edentulous Neck:  no LAD, masses or thyromegaly Cardiovascular:  RRR No LE edema.  Respiratory:   Scattered rhonchi with improved air movement.  Normal respiratory  effort. Abdomen:  soft, NT, ND, scaphoid Skin:  no rash or induration seen on limited exam Musculoskeletal:  no bony abnormality Psychiatric:  blunted mood and affect, speech minimal Neurologic:  no obvious deficits on brief exam  Data Reviewed: I have reviewed the patient's lab results since admission.  Pertinent labs for today include:   K+ 3.3 Glucose 183 BUN 48/Creatinine 1.06/GFR >60 WBC 17.8 Hgb 12    Condition at discharge: poor  The results of significant diagnostics from this hospitalization (including imaging, microbiology, ancillary and laboratory) are listed below for reference.   Imaging Studies: ECHOCARDIOGRAM COMPLETE Result Date: 07/20/2023    ECHOCARDIOGRAM REPORT   Patient Name:   Craig Snow. Date of Exam: 07/20/2023 Medical Rec #:  [id number]         Height:       71.0 in Accession #:    [id number]        Weight:       141.1 lb Date of Birth:  [birth year]  BSA:          1.818 m Patient Age:    89 years         BP:           114/54 mmHg Patient Gender: M                 HR:           82 bpm. Exam Location:  Inpatient Procedure: 2D Echo, Cardiac Doppler, Color Doppler and Intracardiac            Opacification Agent Indications:    Dyspnea  History:        Patient has no prior history of Echocardiogram examinations.                 Arrythmias:Atrial Fibrillation; Risk Factors:Dyslipidemia and                 Hypertension.  Sonographer:    Vern Claude Referring Phys: [id number] JYNWGN POKHREL IMPRESSIONS  1. Left ventricular ejection fraction, by estimation, is 55 to 60%. The left ventricle has normal function. The left ventricle has no regional wall motion abnormalities. Left ventricular diastolic parameters are indeterminate.  2. Right ventricular systolic function is normal. The right ventricular size is normal. There is mildly elevated pulmonary artery systolic pressure. The estimated right ventricular systolic pressure is 41.7 mmHg.  3. The mitral valve is normal  in structure. No evidence of mitral valve regurgitation. No evidence of mitral stenosis.  4. The aortic valve is tricuspid. There is moderate calcification of the aortic valve. Aortic valve regurgitation is not visualized. Aortic valve sclerosis/calcification is present, without any evidence of aortic stenosis.  5. The inferior vena cava is normal in size with greater than 50% respiratory variability, suggesting right atrial pressure of 3 mmHg.  6. The patient was in atrial fibrillation or flutter. FINDINGS  Left Ventricle: Left ventricular ejection fraction, by estimation, is 55 to 60%. The left ventricle has normal function. The left ventricle has no regional wall motion abnormalities. The left ventricular internal cavity size was normal in size. There is  no left ventricular hypertrophy. Left ventricular diastolic parameters are indeterminate. Right Ventricle: The right ventricular size is normal. No increase in right ventricular wall thickness. Right ventricular systolic function is normal. There is mildly elevated pulmonary artery systolic pressure. The tricuspid regurgitant velocity is 3.11  m/s, and with an assumed right atrial pressure of 3 mmHg, the estimated right ventricular systolic pressure is 41.7 mmHg. Left Atrium: Left atrial size was normal in size. Right Atrium: Right atrial size was normal in size. Pericardium: There is no evidence of pericardial effusion. Mitral Valve: The mitral valve is normal in structure. Mild mitral annular calcification. No evidence of mitral valve regurgitation. No evidence of mitral valve stenosis. MV peak gradient, 2.6 mmHg. The mean mitral valve gradient is 1.0 mmHg. Tricuspid Valve: The tricuspid valve is normal in structure. Tricuspid valve regurgitation is mild. Aortic Valve: The aortic valve is tricuspid. There is moderate calcification of the aortic valve. Aortic valve regurgitation is not visualized. Aortic valve sclerosis/calcification is present, without any  evidence of aortic stenosis. Aortic valve mean gradient measures 1.0 mmHg. Aortic valve peak gradient measures 2.3 mmHg. Aortic valve area, by VTI measures 3.11 cm. Pulmonic Valve: The pulmonic valve was normal in structure. Pulmonic valve regurgitation is not visualized. Aorta: The aortic root is normal in size and structure. Venous: The inferior vena cava is normal in size with greater than 50%  respiratory variability, suggesting right atrial pressure of 3 mmHg. IAS/Shunts: No atrial level shunt detected by color flow Doppler.  LEFT VENTRICLE PLAX 2D LVIDd:         4.60 cm     Diastology LVIDs:         2.40 cm     LV e' medial:    11.10 cm/s LV PW:         0.70 cm     LV E/e' medial:  7.7 LV IVS:        0.80 cm     LV e' lateral:   11.90 cm/s LVOT diam:     2.30 cm     LV E/e' lateral: 7.2 LV SV:         43 LV SV Index:   24 LVOT Area:     4.15 cm  LV Volumes (MOD) LV vol d, MOD A4C: 84.2 ml LV vol s, MOD A4C: 22.6 ml LV SV MOD A4C:     84.2 ml RIGHT VENTRICLE             IVC RV Basal diam:  3.90 cm     IVC diam: 1.50 cm RV Mid diam:    2.40 cm RV S prime:     11.10 cm/s TAPSE (M-mode): 1.3 cm LEFT ATRIUM           Index        RIGHT ATRIUM          Index LA diam:      3.60 cm 1.98 cm/m   RA Area:     9.98 cm LA Vol (A4C): 50.6 ml 27.83 ml/m  RA Volume:   19.50 ml 10.73 ml/m  AORTIC VALVE                    PULMONIC VALVE AV Area (Vmax):    3.36 cm     PV Vmax:       0.81 m/s AV Area (Vmean):   3.15 cm     PV Peak grad:  2.6 mmHg AV Area (VTI):     3.11 cm AV Vmax:           76.40 cm/s AV Vmean:          54.500 cm/s AV VTI:            0.139 m AV Peak Grad:      2.3 mmHg AV Mean Grad:      1.0 mmHg LVOT Vmax:         61.70 cm/s LVOT Vmean:        41.300 cm/s LVOT VTI:          0.104 m LVOT/AV VTI ratio: 0.75  AORTA Ao Root diam: 3.70 cm Ao Asc diam:  3.70 cm MITRAL VALVE               TRICUSPID VALVE MV Area (PHT): 4.18 cm    TR Peak grad:   38.7 mmHg MV Area VTI:   2.32 cm    TR Vmax:        311.00 cm/s  MV Peak grad:  2.6 mmHg MV Mean grad:  1.0 mmHg    SHUNTS MV Vmax:       0.80 m/s    Systemic VTI:  0.10 m MV Vmean:      44.6 cm/s   Systemic Diam: 2.30 cm MV Decel Time: 182 msec MV E velocity: 85.90 cm/s [employer] Electronically signed by  Dalton McleanMD Signature Date/Time: 07/20/2023/2:35:20 PM    Final    CT ABDOMEN PELVIS W CONTRAST Result Date: 07/19/2023 CLINICAL DATA:  Weakness.  Shortness of breath. EXAM: CT ABDOMEN AND PELVIS WITH CONTRAST TECHNIQUE: Multidetector CT imaging of the abdomen and pelvis was performed using the standard protocol following bolus administration of intravenous contrast. RADIATION DOSE REDUCTION: This exam was performed according to the departmental dose-optimization program which includes automated exposure control, adjustment of the mA and/or kV according to patient size and/or use of iterative reconstruction technique. CONTRAST:  75mL OMNIPAQUE IOHEXOL 300 MG/ML  SOLN COMPARISON:  January 28, 2022. FINDINGS: Lower chest: Small bilateral pleural effusions are noted with multiple adjacent patchy airspace opacities concerning for pneumonia. Hepatobiliary: No focal liver abnormality is seen. No gallstones, gallbladder wall thickening, or biliary dilatation. Pancreas: Unremarkable. No pancreatic ductal dilatation or surrounding inflammatory changes. Spleen: Normal in size without focal abnormality. Adrenals/Urinary Tract: Adrenal glands and kidneys are unremarkable. No hydronephrosis or renal obstruction. Urinary bladder is not visualized due to scatter artifact arising from bilateral hip arthroplasties. Stomach/Bowel: Stomach is within normal limits. Appendix appears normal. No evidence of bowel wall thickening, distention, or inflammatory changes. Vascular/Lymphatic: Aortic atherosclerosis. No enlarged abdominal or pelvic lymph nodes. Reproductive: Mild prostatic enlargement. Other: No ascites or hernia. Musculoskeletal: Status post bilateral hip arthroplasties. No acute  osseous abnormality. IMPRESSION: Small bilateral pleural effusions are noted with multiple adjacent patchy airspace opacities concerning for multifocal pneumonia. Urinary bladder is not visualized due to scatter artifact arising from bilateral hip arthroplasties. Mild prostatic enlargement. Aortic Atherosclerosis (ICD10-I70.0). Electronically Signed   By: Lupita Raider M.D.   On: 07/19/2023 19:26   DG Chest Portable 1 View Result Date: 07/19/2023 CLINICAL DATA:  Shortness of breath. EXAM: PORTABLE CHEST 1 VIEW COMPARISON:  06/20/2023 FINDINGS: The heart is normal in size. Aortic atherosclerosis. Increasing patchy airspace disease at the right and left lung base. Increasing right upper lobe opacity abutting the fissure. There may be small pleural effusions. No pulmonary edema. No pneumothorax. IMPRESSION: Multifocal patchy opacities with worsening over the last month. This may represent persistent or recurrent pneumonia. Aspiration is considered. Electronically Signed   By: Narda Rutherford M.D.   On: 07/19/2023 18:05    Microbiology: Results for orders placed or performed during the hospital encounter of 07/19/23  Blood culture (routine x 2)     Status: None (Preliminary result)   Collection Time: 07/19/23  6:18 AM   Specimen: BLOOD  Result Value Ref Range Status   Specimen Description   Final    BLOOD RIGHT ANTECUBITAL Performed at [facility], [address], [place], [place] [address]    Special Requests   Final    BOTTLES DRAWN AEROBIC AND ANAEROBIC Blood Culture results may not be optimal due to an inadequate volume of blood received in culture bottles Performed at [facility], [address], [place], [place] [address]    Culture   Final    NO GROWTH 3 DAYS Performed at [facility], [address] [place], [place] [address]    Report Status PENDING  Incomplete  Resp panel by RT-PCR (RSV, Flu A&B, Covid) Anterior Nasal Swab     Status:  None   Collection Time: 07/19/23  5:15 PM   Specimen: Anterior Nasal Swab  Result Value Ref Range Status   SARS Coronavirus 2 by RT PCR NEGATIVE NEGATIVE Final    Comment: (NOTE) SARS-CoV-2 target nucleic acids are NOT DETECTED.  The  SARS-CoV-2 RNA is generally detectable in upper respiratory specimens during the acute phase of infection. The lowest concentration of SARS-CoV-2 viral copies this assay can detect is 138 copies/mL. A negative result does not preclude SARS-Cov-2 infection and should not be used as the sole basis for treatment or other patient management decisions. A negative result may occur with  improper specimen collection/handling, submission of specimen other than nasopharyngeal swab, presence of viral mutation(s) within the areas targeted by this assay, and inadequate number of viral copies(<138 copies/mL). A negative result must be combined with clinical observations, patient history, and epidemiological information. The expected result is Negative.  Fact Sheet for Patients:  BloggerCourse.com  Fact Sheet for Healthcare Providers:  SeriousBroker.it  This test is no t yet approved or cleared by the [place] FDA and  has been authorized for detection and/or diagnosis of SARS-CoV-2 by FDA under an Emergency Use Authorization (EUA). This EUA will remain  in effect (meaning this test can be used) for the duration of the COVID-19 declaration under Section 564(b)(1) of the Act, 21 U.S.C.section 360bbb-3(b)(1), unless the authorization is terminated  or revoked sooner.       Influenza A by PCR NEGATIVE NEGATIVE Final   Influenza B by PCR NEGATIVE NEGATIVE Final    Comment: (NOTE) The Xpert Xpress SARS-CoV-2/FLU/RSV plus assay is intended as an aid in the diagnosis of influenza from Nasopharyngeal swab specimens and should not be used as a sole basis for treatment. Nasal washings and aspirates are unacceptable  for Xpert Xpress SARS-CoV-2/FLU/RSV testing.  Fact Sheet for Patients: BloggerCourse.com  Fact Sheet for Healthcare Providers: SeriousBroker.it  This test is not yet approved or cleared by the [place] FDA and has been authorized for detection and/or diagnosis of SARS-CoV-2 by FDA under an Emergency Use Authorization (EUA). This EUA will remain in effect (meaning this test can be used) for the duration of the COVID-19 declaration under Section 564(b)(1) of the Act, 21 U.S.C. section 360bbb-3(b)(1), unless the authorization is terminated or revoked.     Resp Syncytial Virus by PCR NEGATIVE NEGATIVE Final    Comment: (NOTE) Fact Sheet for Patients: BloggerCourse.com  Fact Sheet for Healthcare Providers: SeriousBroker.it  This test is not yet approved or cleared by the [place] FDA and has been authorized for detection and/or diagnosis of SARS-CoV-2 by FDA under an Emergency Use Authorization (EUA). This EUA will remain in effect (meaning this test can be used) for the duration of the COVID-19 declaration under Section 564(b)(1) of the Act, 21 U.S.C. section 360bbb-3(b)(1), unless the authorization is terminated or revoked.  Performed at [facility], [address] [place], [place] [address]   Blood culture (routine x 2)     Status: None (Preliminary result)   Collection Time: 07/19/23  7:45 PM   Specimen: BLOOD LEFT ARM  Result Value Ref Range Status   Specimen Description   Final    BLOOD LEFT ARM Performed at [facility], [address] [place], [place] [address]    Special Requests   Final    BOTTLES DRAWN AEROBIC AND ANAEROBIC Blood Culture results may not be optimal due to an inadequate volume of blood received in culture bottles Performed at [facility], [address], [place], [place] [address]     Culture   Final    NO GROWTH 4 DAYS Performed at [facility], [address] [place], [place] [address]    Report Status PENDING  Incomplete  Labs: CBC: Recent Labs  Lab 07/19/23 1806 07/19/23 1812 07/20/23 0502 07/21/23 1452 07/22/23 0903 07/23/23 0645  WBC 21.0*  --  25.1* 20.3* 26.8* 17.8*  NEUTROABS 18.3*  --   --  18.6* 24.9* 16.5*  HGB 13.2 14.3 11.8* 13.3 12.7* 12.0*  HCT 42.2 42.0 36.0* 42.4 40.8 38.1*  MCV 107.9*  --  108.8* 110.7* 111.2* 110.1*  PLT 208  --  217 254 250 192   Basic Metabolic Panel: Recent Labs  Lab 07/19/23 1806 07/19/23 1812 07/20/23 0502 07/21/23 1234 07/22/23 0903 07/23/23 0645  NA 137 141 142 146* 148* 145  K 3.8 3.7 3.6 3.8 3.4* 3.3*  CL 103 104 107 114* 112* 115*  CO2 24  --  22 19* 19* 23  GLUCOSE 143* 138* 80 111* 167* 183*  BUN 34* 31* 25* 28* 41* 48*  CREATININE 0.77 0.90 0.67 0.86 1.13 1.06  CALCIUM 10.0  --  9.8 9.6 10.3 9.8  MG  --   --  1.5*  --   --   --   PHOS  --   --  2.2*  --   --   --    Liver Function Tests: Recent Labs  Lab 07/19/23 1806 07/20/23 0502  AST 27 27  ALT 19 16  ALKPHOS 113 99  BILITOT 1.1 1.4*  PROT 7.0 6.4*  ALBUMIN 2.8* 2.4*   CBG: No results for input(s): "GLUCAP" in the last 168 hours.  Discharge time spent: greater than 30 minutes.  Signed: Jonah Blue, MD [facility] 07/23/2023

## 2023-07-23 NOTE — TOC Transition Note (Addendum)
Transition of Care [facility]) - Discharge Note   Patient Details  Name: Craig Snow. MRN: [id number] Date of Birth: 02-Jan-1921  [facility] CM/SW Contact:  Maryjean Ka, LCSW Phone Number: 07/23/2023, 3:48 PM   Clinical Narrative:     Patient has been approved to transition to [facility] today. Patient choice was provided to patient's family.  Per Glenna Fellows, RN [facility] representative that phone number for report is [phone]. Per Glenna Fellows, RN patient's family has complete consents for approved placement at [facility]. This CSW coordinated transport with [facility] and CSW notified care team.   Final next level of care: Hospice Medical Facility Barriers to Discharge: No Barriers Identified   Patient Goals and CMS Choice Patient states their goals for this hospitalization and ongoing recovery are:: End of life care. CMS Medicare.gov Compare Post Acute Care list provided to:: Patient Represenative (must comment) [name], [name] Son   [phone]) Choice offered to / list presented to : Patient, Adult Children [place] ownership interest in [facility].provided to:: Patient    Discharge Placement              Patient chooses bed at:  [facility]) Patient to be transferred to facility by: PTAR Name of family member notified: [name], [name]   [phone] Patient and family notified of of transfer: 07/23/23  Discharge Plan and Services Additional resources added to the After Visit Summary for   In-house Referral: Clinical Social Work Discharge Planning Services: NA Post Acute Care Choice: Home Health                               Social Drivers of Health (SDOH) Interventions SDOH Screenings   Food Insecurity: No Food Insecurity (07/21/2023)  Housing: Low Risk  (07/21/2023)  Transportation Needs: No Transportation Needs (07/21/2023)  Utilities: Not At Risk (07/21/2023)   Social Connections: Socially Isolated (07/21/2023)  Tobacco Use: Medium Risk (07/19/2023)     Readmission Risk Interventions    07/21/2023    3:50 PM 04/15/2022    1:03 PM  Readmission Risk Prevention Plan  Transportation Screening Complete Complete  PCP or Specialist Appt within 5-7 Days  Complete  PCP or Specialist Appt within 3-5 Days Complete   Home Care Screening  Complete  Medication Review (RN CM)  Complete  HRI or Home Care Consult Complete   Social Work Consult for Recovery Care Planning/Counseling Complete   Palliative Care Screening Complete   Medication Review Oceanographer) Complete

## 2023-07-24 LAB — CULTURE, BLOOD (ROUTINE X 2): Culture: NO GROWTH

## 2023-07-25 ENCOUNTER — Other Ambulatory Visit: Payer: Self-pay | Admitting: Radiology

## 2023-07-25 ENCOUNTER — Telehealth (HOSPITAL_COMMUNITY): Payer: Self-pay

## 2023-07-25 DIAGNOSIS — R627 Adult failure to thrive: Secondary | ICD-10-CM

## 2023-07-25 LAB — CULTURE, BLOOD (ROUTINE X 2): Culture: NO GROWTH

## 2023-07-25 NOTE — Telephone Encounter (Signed)
Pt scheduled for peg placement on 1/22. Per note in epic, pt is going to hospice and they have decided not to place peg. Spoke to daughter-n-law and she confirmed. AB

## 2023-07-26 ENCOUNTER — Ambulatory Visit (HOSPITAL_COMMUNITY): Payer: No Typology Code available for payment source

## 2023-07-26 ENCOUNTER — Encounter (HOSPITAL_COMMUNITY): Payer: Self-pay

## 2023-08-05 DEATH — deceased
